# Patient Record
Sex: Male | Born: 1935 | Race: White | Hispanic: No | Smoking: Former smoker
Health system: Southern US, Community
[De-identification: ages and names within clinical notes are randomized; demographics above are authoritative.]

## PROBLEM LIST (undated history)

## (undated) DIAGNOSIS — J189 Pneumonia, unspecified organism: Secondary | ICD-10-CM

## (undated) DIAGNOSIS — I509 Heart failure, unspecified: Secondary | ICD-10-CM

## (undated) DIAGNOSIS — I480 Paroxysmal atrial fibrillation: Secondary | ICD-10-CM

## (undated) DIAGNOSIS — IMO0002 Reserved for concepts with insufficient information to code with codable children: Secondary | ICD-10-CM

## (undated) DIAGNOSIS — I2510 Atherosclerotic heart disease of native coronary artery without angina pectoris: Secondary | ICD-10-CM

## (undated) DIAGNOSIS — IMO0001 Reserved for inherently not codable concepts without codable children: Secondary | ICD-10-CM

## (undated) DIAGNOSIS — J449 Chronic obstructive pulmonary disease, unspecified: Secondary | ICD-10-CM

## (undated) DIAGNOSIS — I219 Acute myocardial infarction, unspecified: Secondary | ICD-10-CM

## (undated) DIAGNOSIS — R918 Other nonspecific abnormal finding of lung field: Secondary | ICD-10-CM

## (undated) DIAGNOSIS — Z950 Presence of cardiac pacemaker: Secondary | ICD-10-CM

## (undated) DIAGNOSIS — I472 Ventricular tachycardia, unspecified: Secondary | ICD-10-CM

## (undated) DIAGNOSIS — N400 Enlarged prostate without lower urinary tract symptoms: Secondary | ICD-10-CM

## (undated) DIAGNOSIS — K219 Gastro-esophageal reflux disease without esophagitis: Secondary | ICD-10-CM

## (undated) DIAGNOSIS — K5790 Diverticulosis of intestine, part unspecified, without perforation or abscess without bleeding: Secondary | ICD-10-CM

## (undated) DIAGNOSIS — I499 Cardiac arrhythmia, unspecified: Secondary | ICD-10-CM

## (undated) DIAGNOSIS — R0602 Shortness of breath: Secondary | ICD-10-CM

## (undated) DIAGNOSIS — K279 Peptic ulcer, site unspecified, unspecified as acute or chronic, without hemorrhage or perforation: Secondary | ICD-10-CM

## (undated) DIAGNOSIS — F419 Anxiety disorder, unspecified: Secondary | ICD-10-CM

## (undated) DIAGNOSIS — M1990 Unspecified osteoarthritis, unspecified site: Secondary | ICD-10-CM

## (undated) DIAGNOSIS — K859 Acute pancreatitis without necrosis or infection, unspecified: Secondary | ICD-10-CM

## (undated) DIAGNOSIS — I255 Ischemic cardiomyopathy: Secondary | ICD-10-CM

## (undated) DIAGNOSIS — Z95810 Presence of automatic (implantable) cardiac defibrillator: Secondary | ICD-10-CM

## (undated) DIAGNOSIS — I10 Essential (primary) hypertension: Secondary | ICD-10-CM

## (undated) HISTORY — DX: Acute myocardial infarction, unspecified: I21.9

## (undated) HISTORY — DX: Ventricular tachycardia: I47.2

## (undated) HISTORY — DX: Peptic ulcer, site unspecified, unspecified as acute or chronic, without hemorrhage or perforation: K27.9

## (undated) HISTORY — DX: Atherosclerotic heart disease of native coronary artery without angina pectoris: I25.10

## (undated) HISTORY — DX: Cardiac arrhythmia, unspecified: I49.9

## (undated) HISTORY — DX: Heart failure, unspecified: I50.9

## (undated) HISTORY — DX: Diverticulosis of intestine, part unspecified, without perforation or abscess without bleeding: K57.90

## (undated) HISTORY — DX: Ventricular tachycardia, unspecified: I47.20

## (undated) HISTORY — DX: Reserved for concepts with insufficient information to code with codable children: IMO0002

## (undated) HISTORY — DX: Reserved for inherently not codable concepts without codable children: IMO0001

## (undated) HISTORY — DX: Shortness of breath: R06.02

## (undated) HISTORY — DX: Gastro-esophageal reflux disease without esophagitis: K21.9

## (undated) HISTORY — PX: OTHER SURGICAL HISTORY: SHX169

## (undated) HISTORY — DX: Ischemic cardiomyopathy: I25.5

## (undated) HISTORY — DX: Essential (primary) hypertension: I10

## (undated) HISTORY — DX: Benign prostatic hyperplasia without lower urinary tract symptoms: N40.0

---

## 1978-12-12 DIAGNOSIS — K859 Acute pancreatitis without necrosis or infection, unspecified: Secondary | ICD-10-CM

## 1978-12-12 DIAGNOSIS — J189 Pneumonia, unspecified organism: Secondary | ICD-10-CM

## 1978-12-12 HISTORY — DX: Acute pancreatitis without necrosis or infection, unspecified: K85.90

## 1978-12-12 HISTORY — DX: Pneumonia, unspecified organism: J18.9

## 1978-12-12 HISTORY — PX: CHOLECYSTECTOMY: SHX55

## 1983-04-13 DIAGNOSIS — I219 Acute myocardial infarction, unspecified: Secondary | ICD-10-CM

## 1983-04-13 HISTORY — DX: Acute myocardial infarction, unspecified: I21.9

## 1994-04-12 HISTORY — PX: CARDIAC DEFIBRILLATOR PLACEMENT: SHX171

## 1999-07-31 ENCOUNTER — Ambulatory Visit (HOSPITAL_COMMUNITY): Admission: RE | Admit: 1999-07-31 | Discharge: 1999-08-01 | Payer: Self-pay | Admitting: Internal Medicine

## 1999-08-01 ENCOUNTER — Encounter: Payer: Self-pay | Admitting: Internal Medicine

## 2004-03-04 ENCOUNTER — Inpatient Hospital Stay (HOSPITAL_COMMUNITY): Admission: AD | Admit: 2004-03-04 | Discharge: 2004-03-05 | Payer: Self-pay | Admitting: Internal Medicine

## 2004-03-04 ENCOUNTER — Ambulatory Visit: Payer: Self-pay | Admitting: Internal Medicine

## 2004-03-19 ENCOUNTER — Ambulatory Visit: Payer: Self-pay | Admitting: Internal Medicine

## 2004-07-27 ENCOUNTER — Ambulatory Visit: Payer: Self-pay

## 2004-12-10 ENCOUNTER — Ambulatory Visit: Payer: Self-pay | Admitting: Family Medicine

## 2005-06-23 ENCOUNTER — Ambulatory Visit: Payer: Self-pay | Admitting: Family Medicine

## 2005-10-26 ENCOUNTER — Ambulatory Visit: Payer: Self-pay | Admitting: Internal Medicine

## 2005-11-01 ENCOUNTER — Ambulatory Visit: Payer: Self-pay | Admitting: Internal Medicine

## 2005-11-01 ENCOUNTER — Observation Stay (HOSPITAL_COMMUNITY): Admission: RE | Admit: 2005-11-01 | Discharge: 2005-11-01 | Payer: Self-pay | Admitting: Internal Medicine

## 2005-11-10 HISTORY — PX: LAPAROTOMY: SHX154

## 2005-11-22 ENCOUNTER — Inpatient Hospital Stay (HOSPITAL_COMMUNITY): Admission: EM | Admit: 2005-11-22 | Discharge: 2005-11-25 | Payer: Self-pay | Admitting: Emergency Medicine

## 2005-11-22 ENCOUNTER — Ambulatory Visit: Payer: Self-pay | Admitting: *Deleted

## 2005-11-22 ENCOUNTER — Encounter: Payer: Self-pay | Admitting: Cardiology

## 2005-11-23 HISTORY — PX: CARDIAC CATHETERIZATION: SHX172

## 2005-12-16 ENCOUNTER — Ambulatory Visit: Payer: Self-pay | Admitting: Internal Medicine

## 2006-10-06 ENCOUNTER — Ambulatory Visit: Payer: Self-pay | Admitting: Internal Medicine

## 2007-01-05 ENCOUNTER — Ambulatory Visit: Payer: Self-pay | Admitting: Internal Medicine

## 2007-04-06 ENCOUNTER — Ambulatory Visit: Payer: Self-pay | Admitting: Internal Medicine

## 2007-07-06 ENCOUNTER — Ambulatory Visit: Payer: Self-pay | Admitting: Internal Medicine

## 2007-09-11 ENCOUNTER — Ambulatory Visit: Payer: Self-pay | Admitting: Internal Medicine

## 2007-12-14 ENCOUNTER — Ambulatory Visit: Payer: Self-pay | Admitting: Internal Medicine

## 2008-03-15 ENCOUNTER — Ambulatory Visit: Payer: Self-pay | Admitting: Internal Medicine

## 2008-06-13 ENCOUNTER — Ambulatory Visit: Payer: Self-pay | Admitting: Internal Medicine

## 2008-06-20 ENCOUNTER — Encounter: Payer: Self-pay | Admitting: Internal Medicine

## 2008-09-24 DIAGNOSIS — I252 Old myocardial infarction: Secondary | ICD-10-CM | POA: Insufficient documentation

## 2008-09-24 DIAGNOSIS — Z87898 Personal history of other specified conditions: Secondary | ICD-10-CM

## 2008-09-24 DIAGNOSIS — I119 Hypertensive heart disease without heart failure: Secondary | ICD-10-CM

## 2008-09-24 DIAGNOSIS — I509 Heart failure, unspecified: Secondary | ICD-10-CM | POA: Insufficient documentation

## 2008-09-24 DIAGNOSIS — Z95810 Presence of automatic (implantable) cardiac defibrillator: Secondary | ICD-10-CM

## 2008-09-24 DIAGNOSIS — K219 Gastro-esophageal reflux disease without esophagitis: Secondary | ICD-10-CM | POA: Insufficient documentation

## 2008-09-24 DIAGNOSIS — I472 Ventricular tachycardia: Secondary | ICD-10-CM | POA: Insufficient documentation

## 2008-09-24 DIAGNOSIS — I2510 Atherosclerotic heart disease of native coronary artery without angina pectoris: Secondary | ICD-10-CM | POA: Insufficient documentation

## 2008-10-07 ENCOUNTER — Ambulatory Visit: Payer: Self-pay | Admitting: Internal Medicine

## 2008-11-06 ENCOUNTER — Encounter: Payer: Self-pay | Admitting: Gastroenterology

## 2008-11-11 ENCOUNTER — Encounter: Payer: Self-pay | Admitting: Gastroenterology

## 2008-12-12 ENCOUNTER — Ambulatory Visit: Payer: Self-pay | Admitting: Gastroenterology

## 2008-12-12 DIAGNOSIS — R1312 Dysphagia, oropharyngeal phase: Secondary | ICD-10-CM

## 2008-12-13 ENCOUNTER — Ambulatory Visit: Payer: Self-pay | Admitting: Gastroenterology

## 2009-01-09 ENCOUNTER — Ambulatory Visit: Payer: Self-pay | Admitting: Internal Medicine

## 2009-02-07 ENCOUNTER — Telehealth: Payer: Self-pay | Admitting: Internal Medicine

## 2009-04-10 ENCOUNTER — Ambulatory Visit: Payer: Self-pay | Admitting: Internal Medicine

## 2009-07-10 ENCOUNTER — Ambulatory Visit: Payer: Self-pay | Admitting: Internal Medicine

## 2009-07-16 ENCOUNTER — Encounter: Payer: Self-pay | Admitting: Internal Medicine

## 2009-09-19 ENCOUNTER — Ambulatory Visit: Payer: Self-pay | Admitting: Internal Medicine

## 2009-12-18 ENCOUNTER — Ambulatory Visit: Payer: Self-pay | Admitting: Internal Medicine

## 2009-12-29 ENCOUNTER — Encounter: Payer: Self-pay | Admitting: Internal Medicine

## 2010-03-17 ENCOUNTER — Ambulatory Visit: Payer: Self-pay | Admitting: Internal Medicine

## 2010-04-12 HISTORY — PX: CATARACT EXTRACTION: SUR2

## 2010-05-14 NOTE — Letter (Signed)
Summary: Remote Device Check  [facility], Main Office  [address]   [place], [place] [address]   Phone: [phone]  Fax: [phone]     July 16, 2009 MRN: [id number]   George Acosta [address] [place], [place]  [address]   Dear [name],   Your remote transmission was recieved and reviewed by your physician.  All diagnostics were within normal limits for you.   ___X___Your next office visit is scheduled for:    JUNE 2011 WITH DR [name]. Please call our office to schedule an appointment.    Sincerely,  Proofreader

## 2010-05-14 NOTE — Cardiovascular Report (Signed)
Summary: Office Visit Remote   Office Visit Remote   Imported By: Roderic Ovens 12/30/2009 12:39:40  _____________________________________________________________________  External Attachment:    Type:   Image     Comment:   External Document

## 2010-05-14 NOTE — Letter (Signed)
Summary: Remote Device Check  [facility], Main Office  [address]   [place], [place] [address]   Phone: [phone]  Fax: [phone]     December 29, 2009 MRN: [id number]   George Acosta [address] [place], [place]  [address]   Dear [name],   Your remote transmission was recieved and reviewed by your physician.  All diagnostics were within normal limits for you.   __X____Your next office visit is scheduled for:   December with Dr [name]. Please call our office to schedule an appointment.    Sincerely,  Vella Kohler

## 2010-05-14 NOTE — Assessment & Plan Note (Signed)
Summary: defib check  Medications Added MULTIVITAMINS   TABS (MULTIPLE VITAMIN) once daily        Visit Type:  Follow-up Primary Provider:  Orson Gear, MD   History of Present Illness: Mr. Buntrock returns today for ICD followup.  He is a 77 yo man with a h/o VT, and an ICM who is s/p ICD implant in 2007.  He denies any intercurrent ICD therapies.  He has class 2 CHF symptoms.  He denies c/p or sob.  He denies syncope or near syncope.  Current Medications (verified): 1)  Sotalol Hcl 120 Mg Tabs (Sotalol Hcl) .... Take One Tablet By Mouth Twice A Day 2)  Potassium Chloride Crys Cr 20 Meq Cr-Tabs (Potassium Chloride Crys Cr) .... Take One Tablet By Mouth Daily 3)  Lorazepam 0.5 Mg Tabs (Lorazepam) .... 1/2 Tablet By Mouth Two Times A Day, Extra 1/2 At Bedtime As Needed 4)  Furosemide 20 Mg Tabs (Furosemide) .... Take One Tablet By Mouth Daily. 5)  Ranitidine Hcl 300 Mg Tabs (Ranitidine Hcl) .... Take 1 Tablet By Mouth Once A Day 6)  Protonix 40 Mg Tbec (Pantoprazole Sodium) .Marland Kitchen.. 1 By Mouth Once Daily 7)  Nitroglycerin 0.4 Mg Subl (Nitroglycerin) .... One Tablet Under Tongue Every 5 Minutes As Needed For Chest Pain---May Repeat Times Three 8)  Aspir-Low 81 Mg Tbec (Aspirin) .Marland Kitchen.. 1 Po Once Daily 9)  Multivitamins   Tabs (Multiple Vitamin) .... Once Daily  Allergies: 1)  ! Sulfa  Past History:  Past Medical History: Last updated: 12/12/2008 CHF (ICD-428.0) CORONARY ARTERY DISEASE (ICD-414.00) VENTRICULAR TACHYCARDIA (ICD-427.1) GERD (ICD-530.81) HYPERTENSION (ICD-401.9) BENIGN PROSTATIC HYPERTROPHY, HX OF (ICD-V13.8) MYOCARDIAL INFARCTION (ICD-410.90) Peptic Ulcer Disease Paroxysmal afib Diverticulosis Recurrent SBO  Past Surgical History: Last updated: 12/12/2008 ICD implanted in 1996  Guidant Vitality II. laparotomy for small bowel obstruction,  2007 Catheterization November 23, 2005  Vital Signs:  Patient profile:   77 year old male Height:      72  inches Weight:      169 pounds BMI:     23.00 Pulse rate:   59 / minute BP sitting:   98 / 64  (left arm)  Vitals Entered By: Laurance Flatten CMA (September 19, 2009 12:25 PM)  Physical Exam  General:  Well developed, well nourished, no acute distress. Head:  Normocephalic and atraumatic. Eyes:  PERRLA, no icterus. Mouth:  No deformity or lesions, dentition normal. Neck:  Supple; no masses or thyromegaly. Chest Wall:  Well healed ICD incision. Lungs:  Clear throughout to auscultation. Heart:  Regular rate and rhythm; no murmurs, rubs,  or bruits. Abdomen:  Soft, nontender and nondistended. No masses, hepatosplenomegaly or hernias noted. Normal bowel sounds. Msk:  Symmetrical with no gross deformities. Normal posture. Pulses:  Normal pulses noted. Extremities:  No clubbing, cyanosis, edema or deformities noted. Neurologic:  Alert and  oriented x4;  grossly normal neurologically.    ICD Specifications Following MD:  Lewayne Bunting, MD     ICD Vendor:  [facility] [employer]     ICD Model Number:  [phone]     ICD Serial Number:  [id number] ICD DOI:  11/01/2005     ICD Implanting MD:  Lewayne Bunting, MD  Lead 1:    Location: RA     DOI: 07/31/1999     Model #: [id number]     Serial #: [id number]     Status: active Lead 2:    Location: RV     DOI: 07/31/1999  Model #: [id number]     Serial #: [id number]     Status: active  Indications::  VT  Explantation Comments: 11-01-05 BSX 1861 EXPLANTED  ICD Follow Up Battery Voltage:  2.59 V     Charge Time:  11.3 seconds     Underlying rhythm:  SR ICD Dependent:  No       ICD Device Measurements Atrium:  Amplitude: 1.5 mV, Impedance: 458 ohms, Threshold: 0.8 V at 0.40 msec Right Ventricle:  Amplitude: 4.4 mV, Impedance: 530 ohms, Threshold: 1.4 V at 0.40 msec  Episodes MS Episodes:  0     Percent Mode Switch:  0     Shock:  0     ATP:  3     Nonsustained:  3     Atrial Therapies:  0 Atrial Pacing:  39%     Ventricular Pacing:  20%  Brady Parameters Mode DDDR     Lower  Rate Limit:  50     Upper Rate Limit 100 PAV 280      Tachy Zones VF:  230     VT:  200     VT1:  160     Next Remote Date:  12/18/2009     Tech Comments:  3 VT-1 EPISODES--ALL SUCCESSFULLY PACE TERMINATED.  NORMAL DEVICE FUNCTION.  NO CHANGES MADE.  LATITUDE CHECK 12-18-09. Vella Kohler  September 19, 2009 12:17 PM MD Comments:  Agree with above.  Impression & Recommendations:  Problem # 1:  AUTOMATIC IMPLANTABLE CARDIAC DEFIBRILLATOR SITU (ICD-V45.02) His device is working normally.  Will recheck in one year.  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  Will continue for now and if he has more VT, would consider amiodarone. His updated medication list for this problem includes:    Sotalol Hcl 120 Mg Tabs (Sotalol hcl) .Marland Kitchen... Take one tablet by mouth twice a day    Nitroglycerin 0.4 Mg Subl (Nitroglycerin) ..... One tablet under tongue every 5 minutes as needed for chest pain---may repeat times three    Aspir-low 81 Mg Tbec (Aspirin) .Marland Kitchen... 1 po once daily  Patient Instructions: 1)  Your physician recommends that you continue on your current medications as directed. Please refer to the Current Medication list given to you today. 2)  Your physician wants you to follow-up in:   6 MONTHS with Dr [name]. You will receive a reminder letter in the mail two months in advance. If you don't receive a letter, please call our office to schedule the follow-up appointment.

## 2010-05-14 NOTE — Cardiovascular Report (Signed)
Summary: Office Visit Remote   Office Visit Remote   Imported By: Roderic Ovens 07/18/2009 15:01:11  _____________________________________________________________________  External Attachment:    Type:   Image     Comment:   External Document

## 2010-05-14 NOTE — Cardiovascular Report (Signed)
Summary: Office Visit   Office Visit   Imported By: Roderic Ovens 09/23/2009 11:04:42  _____________________________________________________________________  External Attachment:    Type:   Image     Comment:   External Document

## 2010-05-14 NOTE — Assessment & Plan Note (Signed)
Summary: f67m/ICD-Guidant/lwb      Allergies Added:   Primary Provider:  Orson Gear, MD   History of Present Illness: [name] returns today for ICD followup.  He is a 77 yo man with a h/o VT, and an ICM who is s/p ICD implant in 2007.  He denies any intercurrent ICD therapies.  He has class 2 CHF symptoms.  He denies c/p or sob.  He denies syncope or near syncope. He does admit to a fairly sedentary lifestyle. No recent hospitalizations for VT or CHF.  Current Medications (verified): 1)  Sotalol Hcl 120 Mg Tabs (Sotalol Hcl) .... Take One Tablet By Mouth Twice A Day 2)  Potassium Chloride Crys Cr 20 Meq Cr-Tabs (Potassium Chloride Crys Cr) .... Take One Tablet By Mouth Daily 3)  Lorazepam 0.5 Mg Tabs (Lorazepam) .... 1/2 Tablet By Mouth Two Times A Day, Extra 1/2 At Bedtime As Needed 4)  Furosemide 20 Mg Tabs (Furosemide) .... Take One Tablet By Mouth Daily. 5)  Ranitidine Hcl 300 Mg Tabs (Ranitidine Hcl) .... Take 1 Tablet By Mouth Once A Day 6)  Protonix 40 Mg Tbec (Pantoprazole Sodium) .Marland Kitchen.. 1 By Mouth Once Daily 7)  Nitroglycerin 0.4 Mg Subl (Nitroglycerin) .... One Tablet Under Tongue Every 5 Minutes As Needed For Chest Pain---May Repeat Times Three 8)  Aspir-Low 81 Mg Tbec (Aspirin) .Marland Kitchen.. 1 Po Once Daily 9)  Multivitamins   Tabs (Multiple Vitamin) .... Once Daily  Allergies (verified): 1)  ! Sulfa  Past History:  Past Medical History: Last updated: 12/12/2008 CHF (ICD-428.0) CORONARY ARTERY DISEASE (ICD-414.00) VENTRICULAR TACHYCARDIA (ICD-427.1) GERD (ICD-530.81) HYPERTENSION (ICD-401.9) BENIGN PROSTATIC HYPERTROPHY, HX OF (ICD-V13.8) MYOCARDIAL INFARCTION (ICD-410.90) Peptic Ulcer Disease Paroxysmal afib Diverticulosis Recurrent SBO  Past Surgical History: Last updated: 12/12/2008 ICD implanted in 1996  Guidant Vitality II. laparotomy for small bowel obstruction,  2007 Catheterization November 23, 2005  Review of Systems  The patient denies chest pain,  syncope, dyspnea on exertion, and peripheral edema.    Vital Signs:  Patient profile:   77 year old male Height:      72 inches Weight:      160 pounds BMI:     21.78 Pulse rate:   60 / minute Resp:     16 per minute BP sitting:   101 / 58  (right arm)  Vitals Entered By: Marrion Coy, CNA (March 17, 2010 12:34 PM)  Physical Exam  General:  Well developed, well nourished, no acute distress. Head:  Normocephalic and atraumatic. Eyes:  PERRLA, no icterus. Mouth:  No deformity or lesions, dentition normal. Neck:  Supple; no masses or thyromegaly. Chest Wall:  Well healed ICD incision. Lungs:  Clear throughout to auscultation. Heart:  Regular rate and rhythm; no murmurs, rubs,  or bruits. Abdomen:  Soft, nontender and nondistended. No masses, hepatosplenomegaly or hernias noted. Normal bowel sounds. Msk:  Symmetrical with no gross deformities. Normal posture. Pulses:  Normal pulses noted. Extremities:  No clubbing, cyanosis, edema or deformities noted. Neurologic:  Alert and  oriented x4;  grossly normal neurologically.    ICD Specifications Following MD:  Lewayne Bunting, MD     ICD Vendor:  [employer] [employer]     ICD Model Number:  [phone]     ICD Serial Number:  [id number] ICD DOI:  11/01/2005     ICD Implanting MD:  Lewayne Bunting, MD  Lead 1:    Location: RA     DOI: 07/31/1999     Model #: [id number]  Serial #: [id number]     Status: active Lead 2:    Location: RV     DOI: 07/31/1999     Model #: [id number]     Serial #: [id number]     Status: active  Indications::  VT  Explantation Comments: 11-01-05 BSX 1861 EXPLANTED  ICD Follow Up Remote Check?  No Battery Voltage:  2.58 V     Charge Time:  11.5 seconds     Battery Est. Longevity:  1+ year Underlying rhythm:  NSR @ 60 ICD Dependent:  No       ICD Device Measurements Atrium:  Amplitude: 1.0 mV, Impedance: 448 ohms, Threshold: .6 V at .4 msec Right Ventricle:  Amplitude: 3.9 mV, Impedance: 524 ohms, Threshold: 1.4 V at .4 msec Shock  Impedance: 45 ohms   Episodes MS Episodes:  0     Percent Mode Switch:  0     Shock:  0     ATP:  0     Nonsustained:  0     Atrial Therapies:  0 Atrial Pacing:  47%     Ventricular Pacing:  31%  Brady Parameters Mode DDDR     Lower Rate Limit:  50     Upper Rate Limit 100 PAV 280      Tachy Zones VF:  230     VT:  200     VT1:  160     Next Remote Date:  06/18/2010     Next Cardiology Appt Due:  03/15/2011 Tech Comments:  NORMAL DEVICE FUNCTION.  NO CHANGES MADE. LATITUDE 06-18-10 AND ROV IN 12 MTHS W/GT. CHECKED BY INDUSTRY. Vella Kohler  March 17, 2010 12:47 PM MD Comments:  Agree with above.  Impression & Recommendations:  Problem # 1:  VENTRICULAR TACHYCARDIA (ICD-427.1) HIs symptoms are well controlled on the meds as below. His updated medication list for this problem includes:    Sotalol Hcl 120 Mg Tabs (Sotalol hcl) .Marland Kitchen... Take one tablet by mouth twice a day    Nitroglycerin 0.4 Mg Subl (Nitroglycerin) ..... One tablet under tongue every 5 minutes as needed for chest pain---may repeat times three    Aspir-low 81 Mg Tbec (Aspirin) .Marland Kitchen... 1 po once daily  Problem # 2:  AUTOMATIC IMPLANTABLE CARDIAC DEFIBRILLATOR SITU (ICD-V45.02) HIs device is working normally.  Will recheck in several months.  Problem # 3:  HYPERTENSION (ICD-401.9) His blood pressure is well controlled and he will maintain a low sodium diet. His updated medication list for this problem includes:    Sotalol Hcl 120 Mg Tabs (Sotalol hcl) .Marland Kitchen... Take one tablet by mouth twice a day    Furosemide 20 Mg Tabs (Furosemide) .Marland Kitchen... Take one tablet by mouth daily.    Aspir-low 81 Mg Tbec (Aspirin) .Marland Kitchen... 1 po once daily  Patient Instructions: 1)  Your physician wants you to follow-up in:  12 months with Dr Court Joy will receive a reminder letter in the mail two months in advance. If you don't receive a letter, please call our office to schedule the follow-up appointment.

## 2010-05-14 NOTE — Cardiovascular Report (Signed)
Summary: Office Visit Remote   Office Visit   Imported By: Roderic Ovens 05/07/2009 12:19:17  _____________________________________________________________________  External Attachment:    Type:   Image     Comment:   External Document

## 2010-06-18 ENCOUNTER — Encounter: Payer: Self-pay | Admitting: Internal Medicine

## 2010-06-18 ENCOUNTER — Encounter (INDEPENDENT_AMBULATORY_CARE_PROVIDER_SITE_OTHER): Payer: Self-pay

## 2010-06-18 DIAGNOSIS — I428 Other cardiomyopathies: Secondary | ICD-10-CM

## 2010-06-25 ENCOUNTER — Encounter: Payer: Self-pay | Admitting: *Deleted

## 2010-06-30 NOTE — Letter (Signed)
Summary: Remote Device Check  [facility], Main Office  [address]   [place], [place] [address]   Phone: [phone]  Fax: [phone]     June 25, 2010 MRN: [id number]   George Acosta [address] [place], [place]  [address]   Dear [name],   Your remote transmission was recieved and reviewed by your physician.  All diagnostics were within normal limits for you.  __X___Your next transmission is scheduled for: 09/17/10.    Please transmit at any time this day.  If you have a wireless device your transmission will be sent automatically.  ______Your next office visit is scheduled for:                              . Please call our office to schedule an appointment.    Sincerely,  Altha Harm, LPN

## 2010-06-30 NOTE — Cardiovascular Report (Signed)
Summary: Office Visit   Office Visit   Imported By: Roderic Ovens 06/25/2010 16:26:15  _____________________________________________________________________  External Attachment:    Type:   Image     Comment:   External Document

## 2010-07-15 ENCOUNTER — Other Ambulatory Visit: Payer: Self-pay | Admitting: *Deleted

## 2010-07-15 MED ORDER — SOTALOL HCL 120 MG PO TABS: 120.0000 mg | ORAL_TABLET | Freq: Two times a day (BID) | ORAL | Status: DC

## 2010-07-15 NOTE — Telephone Encounter (Signed)
Addended by: Hardin Negus on: 07/15/2010 01:21 PM   Modules accepted: Orders

## 2010-07-16 ENCOUNTER — Other Ambulatory Visit: Payer: Self-pay | Admitting: *Deleted

## 2010-07-16 MED ORDER — SOTALOL HCL 120 MG PO TABS: 120.0000 mg | ORAL_TABLET | Freq: Two times a day (BID) | ORAL | Status: DC

## 2010-07-17 ENCOUNTER — Other Ambulatory Visit: Payer: Self-pay | Admitting: *Deleted

## 2010-07-17 MED ORDER — SOTALOL HCL 120 MG PO TABS: 120.0000 mg | ORAL_TABLET | Freq: Two times a day (BID) | ORAL | Status: DC

## 2010-08-25 NOTE — Assessment & Plan Note (Signed)
[facility]                         ELECTROPHYSIOLOGY OFFICE NOTE   NAME:Maceachern, YUNIS VOORHEIS                       MRN:          [id number]  DATE:10/06/2006                            DOB:          June 03, 1928    Mr. Cassity returns today for followup.  He is a very pleasant elderly  male with a history of symptomatic ventricular tachycardia and a  cardiomyopathy, status post ICD insertion.  He returns today for  followup.  He denies chest pain or shortness of breath, has otherwise  been stable.  He has gained over 20 pounds in the last several months,  secondary to increase and improvement of his appetite.   PHYSICAL EXAMINATION:  GENERAL:  He is a pleasant, well-appearing man in  no distress.  VITAL SIGNS:  Blood pressure 123/55, pulse 66 and regular, respirations  18.  Weight was not recorded.  NECK:  No jugular venous distention.  LUNGS:  Clear bilaterally to auscultation.  There are no wheezes, rales  or rhonchi present.  CARDIOVASCULAR:  Regular rate and rhythm with a normal S1 and S2.  EXTREMITIES:  No edema.   Interrogation of his defibrillator demonstrates no intercurrent ICD  therapies.  The P and R waves are 2 and 4 respectively.  The impedance  is 453 in the atrium and 512 in the ventricle with a threshold of 0.8 at  0.4 in the atrium and 1.8 at 0.4 in the ventricle.  Battery voltage is  3.2 volts.  Today we turned his outputs to 3.5 at 0.4 in the ventricle.  He was 35% A-paced.   IMPRESSION:  1. Ischemic cardiomyopathy.  2. Ventricular tachycardia.  3. Status post implantable cardioverter/defibrillator (ICD) insertion.   DISCUSSION:  Overall, Mr. Fritcher is stable, and his defibrillator is  working normally.  I have asked that he not try to gain anymore weight.  We will see him back in the office in one year.  He will follow up in  our Lat[facility] program.     Doylene Canning. [name], MD  Electronically Signed    GWT/MedQ  DD: 10/06/2006   DT: 10/06/2006  Job #: [id number]   cc:   Elease Hashimoto A. Benedetto Goad, M.D.

## 2010-08-25 NOTE — Assessment & Plan Note (Signed)
[facility]                         ELECTROPHYSIOLOGY OFFICE NOTE   NAME:[name], Acosta                       MRN:          [id number]  DATE:09/11/2007                            DOB:          1929/01/02    [name] returns today for followup.  He is a very pleasant elderly  male with ischemic cardiomyopathy, ventricular tachycardia with status  post ICD insertion.  He returns today for followup.  He denies chest  pain or shortness of breath.  He is somewhat depressed over the recent  loss of his wife of 54 years.   MEDICATIONS:  1. Lorazepam p.r.n.  2. Omeprazole 20 a day.  3. Multivitamin.  4. Aspirin 81 a day.  5. Furosemide 20 a day.  6. Sotalol 120 a day.  7. Nitroglycerin p.r.n.   PHYSICAL EXAMINATION:  He a pleasant elderly-appearing man, no distress.  Blood pressure was 103/61.  The pulse is 60 and regular.  Respirations  were 18.  The weight was 159 pounds.  NECK:  No jugular venous distention.  LUNGS:  Clear bilateral.  CHEST:  No wheezes, rales or rhonchi are present.  CARDIOVASCULAR:  Regular rate and rhythm, normal S1 and S2.  ABDOMEN:  Soft, nontender.  EXTREMITIES:  Demonstrated no peripheral edema.   Interrogation of his defibrillator demonstrates a Guidant Vitality T-  165.  The P and R waves were 1 in 5 respectively.  The impendence 463 in  the A, 524 in the V.  The threshold is 0.8 to 0.4 in the A, 1.6 to 0.6  in the V, the__________ volts.  There were __________ therapies.  The  patient was 30% A paced, 11% V paced.   IMPRESSION:  1. Ischemic cardiomyopathy  2. Ventricular tachycardia on sotalol.  3. Status post ICD insertion.  Discussion __________ George Acosta is      stable and his defibrillator is working normally.  We will plan to      see the patient back in the office in 1 year.  He will followup      with our latitude program as well.     Doylene Canning. [name], MD  Electronically Signed   GWT/MedQ  DD:  09/11/2007  DT: 09/11/2007  Job #: [id number]

## 2010-08-28 NOTE — Discharge Summary (Signed)
George Acosta, George Acosta NO.:  [id number]   MEDICAL RECORD NO.:  [id number]          PATIENT TYPE:  INP   LOCATION:  2899                         FACILITY:  [facility]   PHYSICIAN:  Maple Mirza, P.A. DATE OF BIRTH:  [birth year]   DATE OF ADMISSION:  11/01/2005  DATE OF DISCHARGE:  11/01/2005                                 DISCHARGE SUMMARY   ALLERGIES:  SULFA.   PRINCIPAL DIAGNOSES:  1.  Cardioverted defibrillator and elective replacement indicator.      1.  Ischemic cardiomyopathy, history of myocardial infarction 1985.      2.  ICD implanted 1996 from monomorphic ventricular tachycardia.  2.  Hypokalemia despite 10 mEq daily.  Last laboratory study was 3.4.  The      patient was given 10 mEq.  Laboratory study this admission on 10 mEq was      3.4.  Potassium will be increased to 20 mEq daily.   SECONDARY DIAGNOSES:  1.  Hypertension.  2.  Status post cholecystectomy.  3.  Benign prostatic hypertrophy/TURP.  4.  GERD.   PROCEDURE:  Explant of existing cardioverter defibrillator.  Implant of  guidance Vitality 2 ICD Dr. Lewayne Bunting.   BRIEF HISTORY:  Mr. Drayke Loh is a 77 year old male with a history of  myocardial infarction and before that with a history of myocardial  infarction in 1985.  He has ischemic cardiomyopathy and was found to have  sustained monomorphic ventricular tachycardia.  A cardioverter defibrillator  was implanted in 1996.  The implant was changed last in 2001.  He returned  to the office October 26, 2005.  The device was found to be in elective  replacement indicator.   Interrogation of the device demonstrated that there was no intercurrent ICD  discharges but 15 anti tachycardia-pacing episodes.  The patient says he is  not aware of tachy palpitations.   The patient's activity level is good.  He has classic I-II congestive heart  failure symptoms.  He has no complaint of chest pain or syncope.  He sleeps  on two pills but that is  because he has ongoing GERD.   HOSPITAL COURSE:  The patient presented electively November 01, 2005 for change  out of his cardioverter defibrillator.  This was done without incident.  The  patient was discharged the same day.  The incision was without swelling or  erythema.  He [facility] up at [facility] on Monday, November 22, 2005 at  9:20.  He will go home on Keflex 250 mg one tablet 30 minutes before each  meal and at bedtime for a five-day period.  He will also go home on a new  dose of potassium 20 mEq daily.  His other medications include furosemide 20  mg daily, metoprolol 50 mg tablets 1/2 tablet in the morning, 1/2 tablet in  the evening, Digoxin 0.125 mg daily, Imdur 30 mg daily, omeprazole 20 mg  daily, multivitamin daily, enteric-coated aspirin 81 mg daily, lorazepam 0.5  mg 1/2 tab twice  daily.  He was asked to lift nothing heavier than 10  pounds for the next  four weeks.  Not to drive the next two days.  He is to keep his incision dry  for the next seven days and sponge bathe until Monday, July 30.  This was a  20-minute discharge.      Maple Mirza, P.A.     GM/MEDQ  D:  11/01/2005  T:  11/01/2005  Job:  [id number]

## 2010-08-28 NOTE — Discharge Summary (Signed)
NAMEOREE, George NO.:  [id number]   MEDICAL RECORD NO.:  [id number]          PATIENT TYPE:  INP   LOCATION:  2025                         FACILITY:  [facility]   PHYSICIAN:  Doylene Canning. [name], M.D.  DATE OF BIRTH:  Jan 01, 1929   DATE OF ADMISSION:  03/04/2004  DATE OF DISCHARGE:  03/05/2004                           DISCHARGE SUMMARY - REFERRING   PROCEDURES:  Renal/scrotal ultrasound November 24th.   REASON FOR ADMISSION:  Mr. Leitz is a 77 year old male with known history  of ischemic cardiomyopathy status post ICD implantation secondary to a  history of ventricular tachycardia, followed by Dr. Lewayne Bunting, who  initially presented to [facility] with genitourinary pain,  hypertension, and urinary tract infection.  He subsequently developed  ventricular tachycardia resulting in ICD discharge (x3), and was thus  referred for further evaluation and management.  Please refer to dictated  admission note for full details.   LABORATORY DATA:  WBC 4.7, hemoglobin 11.4, hematocrit 32 (MCV 88),  platelets 155.  TSH 0.83.   Admission chest x-ray:  Mild, chronic interstitial lung disease.   Renal/scrotal ultrasound:  Decreased blood flow to right epididymis  suggestive of right epididymitis; complex-appearing left epididymal cyst;  negative ultrasound of the kidneys; no hydronephrosis.   HOSPITAL COURSE:  Following transfer from [facility], the patient was  maintained on an antibiotic regimen with ciprofloxacin 400 mg IV q.12h. for  treatment of probable right epididymitis.  He was noted to have been on  metoprolol at home, which had subsequently been discontinued secondary to  the noted hypotension, but following transfer was placed on Toprol 25 mg  daily for his ischemic cardiomyopathy and suppression of recurrent  ventricular tachycardia.  The patient was able to tolerate this medication  during his brief stay.  ACE inhibitor and Lasix, however, were  placed on  hold.  The patient was placed on Lovenox in the interim.   The patient was referred to Dr. Aldean Ast for urologic evaluation.  An  ultrasound of the kidney and the scrotum was done consistent with left  epididymitis and no renal pathology.  Of note, the patient will need a  follow up post-void residual, with Dr. Aldean Ast, at time of office follow  up.   Dr. Aldean Ast recommended continued medical therapy with a 2-week course of  oral ciprofloxacin.  Additionally, he added Flomax to the regimen.   Of note, Dr. [name] recommended early follow up ICD interrogation, which  will be arranged at our office.   DISCHARGE MEDICATIONS:  1.  Ciprofloxacin 500 mg b.i.d. (x2 weeks).  2.  Flomax 0.4 mg daily.  3.  Coated aspirin 325 mg daily.  4.  Toprol-XL 25 mg daily.  5.  Digoxin 0.125 mg daily.  6.  Prilosec 20 mg daily.  7.  Lasix 20 mg daily.  8.  Isosorbide 30 mg daily.  9.  Ativan as directed.   INSTRUCTIONS:  1.  Hold lisinopril until further orders.  Stop taking metoprolol.  2.  Follow up at Dr. Olga Millers office on [place], November 25th, for ICD  interrogation.  Arrangements will be made through our office.  3.  Follow up with Dr. Vic Blackbird in 3 weeks.  The patient is      instructed to call the office [phone]) for appointment.   DISCHARGE DIAGNOSES:  1.  Status post implantable cardioverter defibrillator discharge.      1.  Secondary to a recurrent ventricular tachycardia (off beta-blocker).  2.  Urinary tract infection/right epididymitis.  3.  Ischemic cardiomyopathy.  4.  History of ventricular tachycardia.      1.  Status post implantable cardioverter defibrillator implantation.  5.  Hypotension.       GS/MEDQ  D:  03/05/2004  T:  03/05/2004  Job:  [id number]   cc:   Heide Guile, MD  [address]  [place]  [place] [address]  Fax: [phone]   Gilford Rile. Benedetto Goad, M.D.  [address]  [place]  [place] [address]  Fax: [phone]   Rozanna Boer., M.D.  [address]  [place]  [place] [address]  Fax: [phone]

## 2010-08-28 NOTE — Assessment & Plan Note (Signed)
[facility]                           ELECTROPHYSIOLOGY OFFICE NOTE   NAME:George Acosta, George Acosta                       MRN:          [id number]  DATE:12/16/2005                            DOB:          06-Apr-1929    Mr. Concannon returns today for follow up.  He is a very pleasant, elderly male  with a history of ischemic cardiomyopathy and VT, who is status post ICD  insertion.  He was recently hospitalized with VT storm.  He returns today  for follow-up.  At the time of his hospitalization, his beta-blocker was  changed to Sotalol and his digoxin was discontinued.  He denies chest pain  or shortness of breath and, overall, has been stable.   EXAM:  GENERAL:  He is a pleasant, elderly man in no distress.  VITAL SIGNS:  Blood pressure 100/66 with a pulse 15 and regular,  respirations were 18.  Weight was 156 pounds.  NECK:  Revealed no jugular venous distention.  LUNGS:  Clear bilaterally to auscultation.  CARDIOVASCULAR:  Revealed a regular bradycardia with normal S1, S2.  EXTREMITIES:  Demonstrated no edema.   Interrogation of his defibrillator demonstrates a Guidant  Vitality T1-65  with P and R waves of 2-1/2 and 4-1/2, respectively.  The patient's  impedence was 478 in the atrium and 518 in the ventricle.  Threshold 0.6 and  0.5 in the atrium, 1.4 and 0.5 in the ventricle.  Battery voltage is 3.2  volts.  There were multiple ICD therapies from his previous hospitalization  back in August noted.  Today, his outputs were turned down to 2 at 0.4 in  the atrium and 2.6 at 0.4 on the right ventricle and the AP delay was  increased to 280 milliseconds from 240 milliseconds to minimize any  ventricular pacing.   IMPRESSION:  1. Ischemic cardiomyopathy.  2. Ventricular tachycardia.  3. Status post implantable cardioverter device insertion.  4. Chronic Sotalol therapy.   DISCUSSION:  Overall, Mr. Cichy is stable and his defibrillator is working  normally.  We will plan to see him back in the office in 4 months.                                   Doylene Canning. [name], MD   GWT/MedQ  DD:  12/16/2005  DT:  12/16/2005  Job #:  [id number]   cc:   Gilford Rile. Benedetto Goad, M.D.  Harl Bowie, M.D.

## 2010-08-28 NOTE — Discharge Summary (Signed)
. [facility]  Patient:    George Acosta, George Acosta                         MRN: [id number] Adm. Date:  07/31/99 Disc. Date: 08/01/99 Attending:  Nathen May, M.D. Dictator:   Rozell Searing, P.A.                           Discharge Summary  PROCEDURES:  ICD generator change/upgrade.  REASON FOR ADMISSION:  [name] is a 77 year old male, status post ICD secondary to history of GT, who was recently seen in the office by Dr. Graciela Husbands for APS follow-up.  ICD was noted to have reached EOL.  Thus, patient was admitted for elective ICD generator replacement.  HOSPITAL COURSE:  Patient underwent successful removal of previous single chamber ICD, by Dr. Rosette Reveal, on April 20.  Dr. [name] also inserted a new atrial lead as well as upgraded the ICD to a dual chamber system.  No complications were noted.  Patient was kept for overnight observation, and cleared for discharge the following morning following results of chest x-ray. Wound site remained stable without evidence of hematoma.  FINAL RECOMMENDATIONS:  Outpatient Keflex x 7 days.  Follow up with Dr. Rosette Reveal 1-2 weeks for wound check.  Resume previous home medication regimen.  DISCHARGE MEDICATIONS: 1. Keflex 250 mg q.6h. (x 7 days) 2. Accupril 30 mg q.d. 3. Metoprolol 25 mg b.i.d. 4. Imdur 30 mg q.d. 5. Lasix 40 mg q.d. 6. Lanoxin 0.125 mg q.d. 7. K-Dur 20 mEq q.d. 8. Coated aspirin.  INSTRUCTIONS:  WOUND CARE:  Patient is to keep his wound site dry and avoid bathing.  ACTIVITY:  He is to gradually maneuver his shoulder as outlined.  FOLLOW-UP:  He is to call our office if there is any swelling/bleeding of the wound site.  Patient is instructed to schedule follow-up appointment with Dr. Rosette Reveal in the following 1-2 weeks for wound check.  DISCHARGE DIAGNOSES: 1. ICD end of life.    a. Status post generator change and upgrade.    b. History of ventricular tachycardia. 2. Severe  cardiomyopathy.    a. Ischemic/nonischemic.    b. Ejection fraction 20-25%.  LABORATORY DATA (PREADMISSION):  Normal metabolic profile and CBC.  INR 1.0. DD:  08/01/99 TD:  08/01/99 Job: [id number]

## 2010-08-28 NOTE — H&P (Signed)
NAMEQUINDARIUS, CABELLO NO.:  [id number]   MEDICAL RECORD NO.:  [id number]          PATIENT TYPE:  INP   LOCATION:  2908                         FACILITY:  [facility]   PHYSICIAN:  Garrison Columbus. Haithcock, MDDATE OF BIRTH:  07-20-1928   DATE OF ADMISSION:  11/21/2005  DATE OF DISCHARGE:                              HISTORY & PHYSICAL   COMMENT:  Dictated on behalf of Dr. Dorethea Clan and Dr. [name].   CHIEF COMPLAINT:  ICD firing.   HISTORY OF PRESENT ILLNESS:  George Acosta is a 77 year old male with  ischemic cardiomyopathy and a history of monomorphic VT, status post an  ICD generator change on November 01, 2005.  Approximately a week and a half  after the generator change, he experienced GI distress, was n.p.o. for  several days, unable to take his p.o. medications including his beta  blocker and was hospitalized in [facility] with diagnosis of  small-bowel obstruction.  While hospitalized, he sustained an ICD shock.  Upon discharge today at home he sustained multiple ICD shocks.  Prior to  the shocks, the patient had no symptoms of shortness of breath,  lightheadedness or dizziness.  He reports his ICD was turned off, then  back on for surgery, but does not think any programming changes were  made.  Workup in the emergency room was significant for potassium of  3.5.  The patient's device was interrogated and the emergency room  results were listed below.   ALLERGIES:  SULFA.   MEDICATIONS:  1. Nitroglycerin.  2. Metoprolol 25 mg p.o. b.i.d.  3. Digoxin 0.125 mg daily.  4. Furosemide 20 mg daily.  5. Imdur 30 mg daily.  6. Omeprazole 20 mg daily.  7. Lorazepam 0.5 mg p.r.n.  8. Aspirin 81 mg daily.   PAST MEDICAL HISTORY:  1. MI in 1985.  2. Sustained monomorphic VT and ischemic cardiomyopathy, ICD placed in      1996 with a generator change in 2001.   SOCIAL HISTORY:  He lives in [place] with his family.   FAMILY HISTORY:  Coronary artery disease.   REVIEW OF SYSTEMS:  Otherwise negative.   PHYSICAL EXAMINATION:  VITAL SIGNS:  Temperature is 87, pulse is 84,  respiratory rate is 18 and blood pressure is 127/30.  GENERAL:  He is a pleasant man in no apparent distress.  HEENT:  Exam shows normocephalic, atraumatic.  Pupils are equally round  and reactive to light.  Extraocular motions intact.  NECK:  Supple.  LYMPHADENOPATHY:  None.  CARDIOVASCULAR:  Jugular venous pulsations normal.  Regular S1 and S2.  No murmurs, rubs, or gallops.  Pulses 2+ bilaterally.  His device site  is intact with the incision closed by Steri-Strips.  LUNGS:  Clear to auscultation.  SKIN:  No rashes.  ABDOMEN:  Stigmata of recent laparoscopic surgery.  The sites are  dressed and appear intact.  NEUROLOGIC:  Alert and oriented x3.  Cranial nerves II-XII grossly  intact, 5/5 strength in all extremities, normal sensation throughout.   ASSESSMENT AND PLAN:  Implantable cardioverter-defibrillator firing.  The patient, in the emergency room,  had several episodes of monomorphic  ventricular tachycardia on telemetry.  I interrogated his device.  Currently, he is in normal sinus rhythm.  His device is set up as a 3-  zone device with ventricular tachycardia detected at 140, monitor only,  time of fast ventricular tachycardia detected at 165 with  antitachycardia pacing x2 followed by 21 then 31 and 31 through  cardioversions.  Ventricular fibrillation is treated at 210 beats per  minute, treated with 23 followed by 31 and 31 through defibrillations.  On November 21, 2005 at 2234, the patient  was shocked x2 appropriately  for a ventricular rhythm in the ventricular fibrillation zone, then at  2235, the patient had antitachycardia pacing followed by shock x3, all  appropriate, for ventricular tachycardia.  On November 20, 2005, the  patient had 1 shock for ventricular fibrillation and on November 18, 2005,  the patient also had 1 shock for ventricular fibrillation.  I  suspect  that the patient had been off his medications during his workup of small-  bowel obstruction and this combined with electrolyte shifts could  certainly exacerbate his arrhythmia.  However, the patient has had  multiple episodes of ventricular tachycardia and even on prior visits,  has had several episodes treated with antitachycardia pacing, not  requiring shocks, which predated the gastrointestinal illness.  Consideration therefore should be given for antiarrhythmic therapy.  For  now, we will initiate lidocaine therapy and restart the patient's beta  blocker at an increased dose of 50 mg p.o. q.6 h.  We will aggressively  supplement electrolytes as the patient was found to have potassium of  3.5 in the emergency room.  However, we will also check thyroid function  tests, liver function tests and pulmonary function tests with the  expectation that the patient may require additional antiarrhythmic  therapy.  Consideration could be given to amiodarone in his situation.      Daniel B. Haithcock, MD  Electronically Signed     DBH/MEDQ  D:  11/22/2005  T:  11/22/2005  Job:  [id number]

## 2010-08-28 NOTE — Procedures (Signed)
[place]  Patient:    George Acosta, George Acosta                       MRN: [id number] Proc. Date: 07/31/99 Adm. Date:  [id number] Attending:  Nathen May CC:         Harl Bowie, M.D.             Kathrine Cords and Delsa Grana             at [facility]                           Procedure Report  PROCEDURE:  Removal of an old single chamber ICD and insertion of a new dual-chamber ICD with implantation of a new atrial pacing lead.  INDICATION:  This patient is a very pleasant 77 year old man with ischemic and nonischemic cardiomyopathy and ventricular tachycardia which is hemodynamically  significant.  He underwent ICD implantation five years ago.  Since implantation, he has done fairly well but has complained of some intermittent weakness associated with heart rates in the mid-40s to mid-50s.  He has a CPI model [id number] defibrillator with stable R-waves in the 4 to 4.5 range with stable pacing impedances.  He is  subsequently referred for ICD implantation and upgrade to a dual-chamber system.  DESCRIPTION OF PROCEDURE:  After informed consent was obtained, the patient was  taken to the diagnostic EP lab in the fasting state.  After the usual preparation and draping, intravenous fentanyl and midazolam were given for conscious sedation. A total of 30 cc of lidocaine was infiltrated into the left infraclavicular region over the old surgical scar.  An 11 cm wound was carried out over the old surgical scar and electrocautery and blunt dissection were utilized to dissect down through the pectoralis muscle and subcutaneous tissue and to the ICD.  Electrocautery was utilized to maintain hemostasis.  The old ICD was subsequently removed and the leads were free of adhesions with blunt dissection and electrocautery.  At this point, 20 cc of contrast was injected into the left upper extremity venous system and this demonstrated a patent left  subclavian vein.  A Pacesetter model  1488, active fixation pacing lead was inserted into the subclavian vein and advanced into the right atrium.  The lead was placed in the right atrial appendage and actively fixed.  The final measured P-waves were 3 mV and the atrial pacing threshold was 0.2 V t 0.5 msec with a pacing impedance of 494 ohms.  With the atrial lead in satisfactory position, it was secured to the pectoralis muscle with a figure-of-eight suture. This sewing sleeve was also secured with a silk suture.  Following this, kanamycin irrigation was utilized to irrigate the wound.  The pacing and defibrillation leads were then connected to the CPI, model 1861 dual-chamber ICD, and the ICD was also secured to the subpectoralis fascia with  silk suture.  Following this, additional kanamycin irrigation was utilized to irrigate the wound and the wound was inspected and this demonstrated no evidence of active bleeding. The wound was then closed with two layers of 2-0 Vicryl, followed by a layer of 3-0 Vicryl, and a layer of 4-0 Vicryl.  At this point and time, defibrillation threshold testing was carried out.  Dr. Wynona Luna group was asked to come in and provide deeper sedation and help in maintaining the patients airway.  After the patient  was asleep, ventricular fibrillation was induced with a T-wave shock.  A 21 joule shock was subsequently utilized to restore sinus rhythm.  At this point, the patient sedation was reversed with romazicon and benzoin was  painted on the skin and Steri-Strips were applied and the patient returned to his room in good condition.  COMPLICATIONS:  None.  RESULTS:  This demonstrates successful explantation of an old guidant model 1762 single chamber defibrillator which was at [facility] and implantation of a new dual chamber guidant defibrillator system without immediate suture complication. DD:  07/31/99 TD:  07/31/99 Job:  [id number]

## 2010-08-28 NOTE — Op Note (Signed)
NAMEHUSEIN, George Acosta NO.:  [id number]   MEDICAL RECORD NO.:  [id number]          PATIENT TYPE:  INP   LOCATION:  2899                         FACILITY:  [facility]   PHYSICIAN:  Doylene Canning. [name], M.D.  DATE OF BIRTH:  Jan 29, 1929   DATE OF PROCEDURE:  11/01/2005  DATE OF DISCHARGE:                                 OPERATIVE REPORT   ELECTROPHYSIOLOGIC PROCEDURE NOTE   PROCEDURE PERFORMED:  Removal of previously implanted dual-chamber  implantable cardioverter-defibrillator in the subpectoralis major pocket and  insertion of a new dual-chamber implantable cardioverter-defibrillator by  way of the same subpectoralis implant.   1.  INTRODUCTION:  The patient is 77 year old male with a history of an ischemic cardiomyopathy  and VT, status post initial ICD implantation in 1996 and generator change in  2001, who presented with device at end-of-life.  He also has severe sinus  bradycardia with high-grade conduction disease.   1.  PROCEDURE:  After informed consent was obtained,  the patient was taken to the  diagnostic catheterization lab in the fasting state.  After usual  preparation and draping, intravenous fentanyl and Midazolam was given for  sedation.  30 mL of lidocaine was infiltrated over the left infraclavicular  insertion site and a 10-cm incision was carried out over this region.  Electrocautery utilized to dissect down to the fascial plane.  The device  was under the pectoralis major and with a combination of electrocautery and  blunt dissection, the device was dissected free from underneath the  subpectoralis major muscle and removed with gentle traction.  Electrocautery  utilized to assure hemostasis.  The pocket was irrigated.  The leads were  removed from the generator.  The liter analyzed.  The P-waves measured 1.9  mV.  The R-waves 6.9 mV.  The pace impedance 453.  The atrium 596 in the  ventricle.  Threshold 0.4, 0.5 in the atrium  and 1.7 to 0.5 in  the  ventricle.  These satisfactory parameters.  The new Guidant Vitality II, ICD  model T165 serial number [id number] was connected to the atrial and ventricular  leads and placed back in the subcutaneous pocket.  Additional kanamycin was  utilized to irrigate the pocket and defibrillation threshold testing carried  out.   After the patient was more deeply sedated with fentanyl and Versed, a T-wave  shock was delivered which induced ventricular tachycardia at a rate of 140  beats per minute (below the detection rate).  At this point pacing was  subsequently carried out through the device, which  paced the patient from a  slower VT into a faster VT at 185 beats per minute.  This resulted in  adequate detection of the arrhythmia and the device charged up and shocked  at 14 joules, terminating VT and restoring sinus rhythm.  The shock  impedance was 39 ohms.  With this satisfactory parameter, the pocket was  closed with layer of 2-0 Vicryl followed by layer of 3-0 Vicryl, followed by  layer of 4-0 Vicryl.  Benzoin was painted on the skin.  Steri-Strips were  applied and pressure  dressing was placed.  The patient was returned to his  room in satisfactory condition.   1.  COMPLICATIONS:  There were no immediate procedure complications.   1.  RESULTS:  This demonstrates successful removal of previously implanted subpectoralis  Guidant implantable cardioverter-  defibrillator  insertion and insertion of a new Guidant implantable cardioverter-  defibrillator in a patient with symptomatic bradycardia and ventricular  tachycardia and an ischemic cardiomyopathy.           ______________________________  Doylene Canning. [name], M.D.     GWT/MEDQ  D:  11/01/2005  T:  11/01/2005  Job:  [id number]   cc:   Elease Hashimoto A. Benedetto Goad, M.D.  Fax: [phone]

## 2010-08-28 NOTE — H&P (Signed)
NAMECORIAN, HANDLEY NO.:  [id number]   MEDICAL RECORD NO.:  [id number]          PATIENT TYPE:  INP   LOCATION:  2899                         FACILITY:  [facility]   PHYSICIAN:  Doylene Canning. [name], M.D.  DATE OF BIRTH:  11-25-1928   DATE OF ADMISSION:  11/01/2005  DATE OF DISCHARGE:                                HISTORY & PHYSICAL   ELECTROPHYSIOLOGIST:  Dr. Lewayne Bunting.   CARDIOLOGIST:  Dr. Sherlyn Lick [facility].   Dr. Benedetto Goad and [facility] care of his primary medical needs.   ALLERGIES:  He has allergy to Sulfa.   PRESENTING CIRCUMSTANCE:  I am here to have my battery changed; the warning  light has been on for 3 months.   HISTORY OF PRESENT ILLNESS:  Mr. Sochacki is a 77 year old male with a history  of myocardial infarction back in 1985.  He has ischemic cardiomyopathy.  He  has a history of sustained monomorphic ventricular tachycardia.  For both of  these indications a cardioverter defibrillator was implanted in 1996.  This  generator was changed in 2001.  He returned to the office at [facility] October 26, 2005.  Interrogation of the device showed that it was at  elective replacement indicator.   Interrogation also demonstrates that the cardioverter defibrillator has not  discharged but there were 15 anti-tachycardiac pacing episodes for  ventricular tachycardia.  The patient is apparently not aware of tachycardia  and says he is not having palpitation.   The patient's overall activity level is good.  He has class I-II congestive  heart failure symptoms.  He has no symptoms of recent chest pain or syncope.  He sleeps on 2 pillows a night but that is for GERD.  Thromboembolic risk  factors include hypertension, dyslipidemia.  Cardiac risk and equivalents  include prior myocardial infarction in 1985.   ALLERGIES:  SULFA DRUGS.   MEDICATIONS:  1.  Lorazepam 0.5 mg, one-half tablet twice daily.  2.  Imdur 30 mg daily.  3.  Digoxin  0.125 mg daily.  4.  Omeprazole 20 mg daily.  5.  Multivitamin daily.  6.  Enteric coated aspirin 81 mg daily.  7.  Furosemide 20 mg daily.  8.  Metoprolol 50 mg tablets, one-half tab twice daily.  9.  As of July 17th, potassium 10 mEq daily.  His potassium found to be 3.4      at that time.   SOCIAL HISTORY:  The patient lives in [place] with his wife.  He is a  retired Naval architect.  He does not partake of alcoholic beverages.  He does  not partake of tobacco having quit over 22 years ago.   REVIEW OF SYMPTOMS:  The patient is not having fevers, chills or night  sweats.  He has no apparent weight change lately.  HEENT:  No hoarseness.  No vertigo.  No photophobia.  No hearing loss.  INTEGUMENT:  The patient is  not experiencing any non-healing ulcerations to the lower extremities.  CARDIOPULMONARY:  No chest pain.  No dyspnea on exertion.  Two  pillows but  this is for GERD.  Only very occasional edema.  No syncope or pre-syncope.  Palpitations but this is secondary to nonsustained ventricular tachycardia.  UROGENITAL:  He does have nocturia.  He was admitted in 2005 with  epididymitis but he is not having any testicular pain currently.  NEUROPSYCHIATRIC:  No depression or anxiety.  No weakness or numbness.  GI:  He has a history of GERD for which he takes omeprazole and sleeps upright on  2 pillows.  He has never had a history of GI bleeding.  ENDOCRINE:  No  history of diabetes or thyroid disease.  MUSCULOSKELETAL:  No marked  arthralgias or joint swelling.   PHYSICAL EXAMINATION:  GENERAL:  The patient is alert and oriented x3 in no  acute distress.  VITAL SIGNS:  Temperature 97, pulse 60 and regular, blood pressure 113/70,  respirations are 18.  Oxygen saturation 97% on room air.  HEENT:  Normocephalic, atraumatic.  Eyes - pupils equal, round and reactive  to light.  Extraocular movements intact.  Sclerae clear.  Nares without  discharge.  NECK:  Supple.  No carotid bruits  auscultated.  No thyromegaly.  No jugular  venous distention.  HEART:  Regular rate and rhythm without murmur.  No rubs or gallops.  LUNGS:  Clear to auscultation and percussion bilaterally.  ABDOMEN:  Soft, nondistended.  No rebound or guarding.  No  hepatosplenomegaly.  Abdominal aorta is non-pulsatile.  EXTREMITIES:  Dampened pedal pulses and dorsalis pedis, 2/4 bilaterally.  Radial pulses 4/4 bilaterally.  No clubbing, cyanosis or edema.  MUSCULOSKELETAL:  No joint deformities.  NEUROLOGIC:  Cranial nerves II-XII grossly intact.   LABORATORY STUDIES:  Have been taken July 17th.  The patient is not on  Coumadin.   IMPRESSION:  1.  Admitted with cardioverter defibrillator which is a Guidant model      elective replacement indicator.  2.  History of myocardial infarction in 1985.  3.  Cardioverter defibrillator implanted in 1996 for monomorphic ventricular      tachycardia and ischemic cardiomyopathy.  4.  Hypertension.  5.  Status post cholecystectomy.  6.  Benign prostatic hypertrophy/transurethral resection of prostate.  7.  Gastroesophageal reflux disease.   PLAN:  The patient admitted for generator change for his Guidant  cardioverter defibrillator.      Maple Mirza, P.A.    ______________________________  Doylene Canning. [name], M.D.    GM/MEDQ  D:  11/01/2005  T:  11/01/2005  Job:  [id number]

## 2010-08-28 NOTE — Cardiovascular Report (Signed)
[name], George Acosta NO.:  [id number]   MEDICAL RECORD NO.:  [id number]          PATIENT TYPE:  INP   LOCATION:  2908                         FACILITY:  [facility]   PHYSICIAN:  Charlton Haws, M.D.     DATE OF BIRTH:  01-01-1929   DATE OF PROCEDURE:  DATE OF DISCHARGE:                              CARDIAC CATHETERIZATION   PROCEDURE:  Coronary arteriography.   INDICATIONS:  History of ischemic cardiomyopathy, sustained VT with multiple  AICD firings.   Cine catheterization was done with 6-French catheters in the right femoral  artery. At the end of the case, we Angio-Sealed the arteriotomy site with  good hemostasis.   The left main coronary artery had a 40% ostial stenosis.  There was no  catheter damping.   Left anterior descending artery was normal.  First, second and third  diagonal branches were normal.   The circumflex coronary artery had a 50% ostial lesion.  There was a single  obtuse marginal branch with 20-30% __________ lesion and a single AV groove  branch.   There were left-to-right collaterals primarily from the circumflex to the  distal right.   The right coronary artery was chronically occluded proximally.   Aortic pressure is 121/60, LV pressure was 120/10.   The patient had a 2-D echocardiogram yesterday. His EF was 50% with inferior  wall hypokinesis.   IMPRESSION:  The patient does not have any acute ischemic territories   Dr. [name] will reassess his antiarrhythmic medications. I suspect he will  taper his beta-blocker down and start Betapace.   The patient is currently on lidocaine and not having any sustained VT.           ______________________________  Charlton Haws, M.D.     PN/MEDQ  D:  11/23/2005  T:  11/23/2005  Job:  [id number]

## 2010-08-28 NOTE — Assessment & Plan Note (Signed)
[facility]                           ELECTROPHYSIOLOGY OFFICE NOTE   NAME:[name], [name]                       MRN:          [id number]  DATE:10/26/2005                            DOB:          1928/12/14    George Acosta returns today for followup.  He is a very pleasant elderly male  with a history of sustained monomorphic VT and ischemic cardiomyopathy,  status post ICD insertion, with the most recent device placed in 2001.  We  have not seen him since April of 2006.  He returns today for followup.  He  denies intercurrent ICD shocks, although he has had 15 episodes of VT which  were successfully pace terminated.  His device is now at [facility] and reached  this back in May.  P and R waves were 1.6 and 4.4.  Of note, the R waves had  been stable for many years.  The pacing impedance was 468 in the atrium, 545  in the ventricle.  The threshold was 0.6 at 0.4 in the atrium, 1 at 0.4 in  the ventricle.  Battery voltage was 2.38 volts, and charge time was 15.9  seconds.   IMPRESSION:  1.  Ischemic cardiomyopathy.  2.  VT.  3.  Syncope secondary to #2.  4.  Congestive heart failure, presently class II.   DISCUSSION:  George Acosta is stable, but his defibrillator battery is at [facility].  I have discussed treatment options with him, and will proceed with change  out of his ICD in the next convenient period.                                   Doylene Canning. [name], MD   GWT/MedQ  DD:  10/26/2005  DT:  10/26/2005  Job #:  [id number]   cc:   Harl Bowie, MD  Gilford Rile. Benedetto Goad, MD

## 2010-08-28 NOTE — Discharge Summary (Signed)
NAMEKELVYN, SCHUNK NO.:  [id number]   MEDICAL RECORD NO.:  [id number]          PATIENT TYPE:  INP   LOCATION:  3737                         FACILITY:  [facility]   PHYSICIAN:  Farris Has. Dorethea Clan, MD  DATE OF BIRTH:  23-Sep-1928   DATE OF ADMISSION:  11/21/2005  DATE OF DISCHARGE:  11/25/2005                                 DISCHARGE SUMMARY   This dictation is a 35 minute dictation with examination on dictation.   ALLERGIES:  TO SULFA.   PRINCIPAL DIAGNOSES:  1. Admitted with cardioverted defibrillator shocks.  (a.)  Sustained monomorphic ventricular tachycardia.  (b.)  Treated with IV lidocaine then switched to Betapace, which was started  November 23, 2005.  (c.)  Hypotension on Betapace, holding Imdur.  1. Coronary artery disease.  Troponin-I studies this admit 0.38, then      1.06, then 0.62.  (a.)  Catheterization November 23, 2005 demonstrating 100% proximal right  coronary artery occlusion.  The left main had a 40% ostial stenosis.  The  left anterior descending and the first, second and third diagonal branches  were normal.  The left circumflex had a 50% ostial lesion.  There was a  single obtuse marginal with a 20% to 30% proximal lesion.  There were left  to right collaterals from the circumflex to the distal right coronary  artery.  Ejection fraction 50% on 2D echocardiogram.  (b.)  Echocardiogram November 22, 2005.  Ejection fraction 50%.  Hypokinesis  of the inferior wall.  (c.)  history of inferior myocardial infarction 1985.  (d.)  ICD implanted in 1996.  Last generator change was November 01, 2005.  He  has a Guidant Vitality II.  (e.)  Quest I-II congestive heart failure.  1. Sinus bradycardia - conduction system disease.   SECONDARY DIAGNOSES:  1. Hypertension.  2. Dyslipidemia.  3. Benign prostatic hypertrophy - TUR.  4. Gastroesophageal reflux disease.  5. Recent exploratory laparotomy for small bowel obstruction, November 21, 2005,  discharged.   PROCEDURES:  1. November 22, 2005 echocardiogram.  Ejection fraction 50%.  Hypokinesis of      the inferior wall.  2. Left heart catheterization, November 23, 2005, study is dictated above.   BRIEF HISTORY:  Mr. Ladouceur is a 77 year old male.  He has a history of  ischemic cardiomyopathy and a history of monomorphic ventricular  tachycardia.  He had an ICD implanted for sustained monomorphic ventricular  tachycardia.  His last generator change was November 01, 2005.   A week and a half after the generator change, he experienced GI distress.  He was hospitalized at [facility] with diagnosis of small bowel  obstruction and underwent exploratory laparotomy.  While hospitalized, he  had an ICD shock.  Upon discharge home, November 21, 2005 the day of this  admission, he sustained multiple ICD shocks.   Prior to these shocks, the patient had no symptoms of shortness of breath,  lightheadedness or dizziness.  He reports that his cardioverted  defibrillator was turned off for surgery, but then turned back on.  He  does  not think any programming changes were made.  Emergency room workup was  significant for a potassium of 3.5.  Patient's device was interrogated in  the emergency room and demonstrated that the ventricular defibrillation zone  had shocked twice appropriately for that.  The patient has also had  antitachycardia pacing x3, all appropriate for ventricular tachycardia.   It is possible that the patient had been off his antiarrhythmic medications  during the surgery and also had electrolyte shifting as a result of the  surgery.  Antiarrhythmic therapy will be restarted, but immediately he will  be placed on IV lidocaine.   HOSPITAL COURSE:  Patient presented to the emergency room at [facility] November 21, 2005, the day he was discharged from [facility]  after undergoing exploratory laparotomy for a small bowel obstruction.  The  patient received  appropriate ICD discharges for ventricular  fibrillation/ventricular tachycardia with some episodes of successful  antitachycardia pacing.  The patient was placed on IV lidocaine in the  emergency room.  Troponin-I values were cycled.  As mentioned, they were  0.38, 1.06 and 0.62.  He was seen by Dr. Sherryl Manges on November 22, 2005, it  was determined that it had been quite a while since his last  catheterization, which was in 1998 and ischemic source had to be ruled out,  so the patient underwent both echocardiogram on November 22, 2005, as well as  being set up for left heart catheterization.  Left heart catheterization was  done on November 23, 2005.  The study showed no dramatic changes from  catheterization, which was done back in 1998 and medical therapy is  continuing for Mr. Wilkinson.  In addition to the appropriate discharges from  the ICD, there were one or two inappropriate discharges and to address this,  device was reprogrammed to increase the TVAR interval.  In addition to the  TVAR adjustment antitachycardia pacing was set for ventricular tachycardia  at less than or equal to 230.  The AV delay was changed and there is a  suggestion that the patient would benefit from antiarrhythmic drugs.  Mr.  Puffenbarger was started on sotalol 120 mg twice daily on November 23, 2005.  He  became somewhat hypotensive with a full compliment of medications he was  taking and so his Imdur has been placed on hold.  In addition, his beta  blocker metoprolol has not been restarted.  The patient's ventricular  arrhythmias have been quiescent ever since starting sotalol and weaning IV  lidocaine and patient has been observed on telemetry for a period of 48  hours and will discharge November 25, 2005.   DISCHARGE MEDICATIONS:  His discharge medications include:  1. Sotalol 120 mg one tablet in the morning and one tablet in the evening.      This is a new medication.  2. Furosemide 20 mg daily. 3. Omeprazole 20  mg daily.  4. Enteric-coated aspirin 81 mg daily.  5. Potassium chloride 20 mEq  two tabs daily.  This is also a new      medication.  6. Lorazepam 0.5 mg as needed.  7. He is asked to hold off on taking Imdur and to hold off on taking      metoprolol.   FOLLOWUP:  He is to followup with Dr. Lewayne Bunting at [facility] on  Thursday, December 16, 2005 at 12:30.   LABORATORY STUDIES THIS ADMISSION:  On the day of discharge, serum  electrolytes  sodium 136, potassium 4.0, chloride 101, carbonate 26, BUN is  9, creatinine 1.0, glucose is 87.  Complete blood count:  White cell is 6.2,  hemoglobin 12.5, hematocrit 36.5 and platelets 327.  The BNP on November 22, 2005 was 93.  TSH this admission was 2.569.     ______________________________  Maple Mirza, PA      Farris Has. Dorethea Clan, MD  Electronically Signed    GM/MEDQ  D:  11/25/2005  T:  11/25/2005  Job:  [id number]   cc:   Doylene Canning. [name], MD  Gilford Rile Benedetto Goad, M.D.

## 2010-08-28 NOTE — Consult Note (Signed)
George Acosta, YAMASHIRO NO.:  [id number]   MEDICAL RECORD NO.:  [id number]          PATIENT TYPE:  INP   LOCATION:  2025                         FACILITY:  [facility]   PHYSICIAN:  Rozanna Boer., M.D.DATE OF BIRTH:  07-04-1928   DATE OF CONSULTATION:  03/05/2004  DATE OF DISCHARGE:  03/05/2004                                   CONSULTATION   REASON FOR CONSULTATION:  Right epididymitis, UTI.   HISTORY OF PRESENT ILLNESS:  This 77 year old patient was admitted on  March 04, 2004 by Dr. Doylene Canning. George Acosta with history of ventricular  fibrillation and apparent UTI.  About two days ago, the patient presented to  [facility] with painful burning, hesitancy, urgency, and some right scrotal  swelling.  Apparently during the exam by a surgeon, his ventricular  defibrillator went off.  His cardiac rhythm has been stable as he has  ischemic heart disease.  He has had an ICD diagnosed in 1996 and he has the  defibrillator implanted for some time and this is the first time it has gone  off.  He still has difficulty voiding but it burns a little bit less and he  has been started on Cipro.  He did grow out E. coli urinary tract infection  that is pansensitive.  His BUN is 21, creatinine is 1.3.  His hematocrit is  40%, white count is 12,000, and his urinalysis is positive for nitrites and  did grow out E. coli.  The bacteria was pansensitive to all the antibiotics  tested.   The patient has been afebrile since he has been in the office but is still  having difficulty voiding.  He says he gets up typically twice a night.  He  has had a urinary tract infection in the past.  He thinks several months  ago but could not tell me exactly when.  These have not been febrile by  history.  He does not do any lifting or straining.  He is a retired Ecologist.   MEDICATIONS:  Cipro IV, Rocephin that he was started on down in [place],  digoxin, Lasix, Prilosec, Coreg, Ativan,  metoprolol, Lisinopril, Isosorbide,  aspirin, multivitamins, and nitroglycerin p.r.n.   ALLERGIES:  SULFA.   PAST SURGICAL HISTORY:  1.  Cholecystectomy in 1980.  2.  TURP in the distant past.   REVIEW OF SYMPTOMS:  Does have GERD and history of hypertension.  Does not  smoke but does have an 85-pack-year history of smoking.  Does not drink  alcohol.  Lives with his wife in [place].  Does not smoke or drink as  above.  The patient has had congestive heart failure in the past but not  recently.   FAMILY HISTORY:  He has one sibling with cardiovascular disease.  Father  died in his 57s.  Mother died at age 26.   PHYSICAL EXAMINATION:  VITAL SIGNS:  Temperature was 100.4 on admission,  blood pressure is 98/56, pulse 74 and regular, respirations 18.  GENERAL:  He is a pleasant, elderly white male in no acute distress.  HEENT:  Clear.  Oropharynx is benign.  CHEST:  Clear.  ABDOMEN:  Soft.  Liver and spleen not palpable.  Pacemaker is palpable in  the left upper quadrant.  Bladder is not distended.  GENITAL:  Penis uncircumcised.  He has right scrotal swelling and this seems  to be limited to the epididymidis posteriorly.  Does have atrophic testes  bilaterally.  The left side feels normal.  No CVA pain.  His prostate is  moderately enlarged but benign.  Not fixed or induration.  [place] vesicles  not palpated.  EXTREMITIES:  Faint distal pulses.  Intact sensation to light touch.  No  edema.   IMPRESSION:  1.  Right epididymitis.  2.  Obstructive uropathy.  3.  Escherichia coli urinary tract infection.   RECOMMENDATIONS:  We will keep him on Cipro for probably a two-week period.  He could be switched from IV to p.o. at any time as it is concentrated well  in the urine.  We will also add Flomax to help him empty his bladder better  and check a scrotal and renal ultrasound while he is here.  If these are  okay and he is stabilized, he could probably go home tomorrow on  antibiotics  for about two weeks and I will check him in the office in about a week after  that to make sure he is doing well at that time.       HMK/MEDQ  D:  03/05/2004  T:  03/05/2004  Job:  [id number]

## 2010-08-28 NOTE — H&P (Signed)
George Acosta, [place] NO.:  [id number]   MEDICAL RECORD NO.:  [id number]          PATIENT TYPE:  INP   LOCATION:  2025                         FACILITY:  [facility]   PHYSICIAN:  Doylene Canning. [name], M.D.  DATE OF BIRTH:  1929/04/09   DATE OF ADMISSION:  03/04/2004  DATE OF DISCHARGE:                                HISTORY & PHYSICAL   ADMITTING DIAGNOSIS:  Ventricular tachycardia in the setting of urinary  tract infection with sepsis type syndrome and probable epididymidis.   HISTORY OF PRESENT ILLNESS:  The patient is a very pleasant 77 year old male  with longstanding ischemic cardiomyopathy and ventricular tachycardia.  Despite this he has had class I-II heart failure symptoms.  The patient has  been followed by me in defibrillator clinic having been most recently seen  back in May.  At that time he was found to be at a middle of life 2 with  regard to his defibrillator.  The patient was in his usual state of health  until approximately 36 hours ago, when he had fevers, chills, and dysuria.  He noted that his urine became cloudy and discolored.  Because of all of the  above, he presented to the emergency department in [facility] where he  was found to be hypotensive with systolic blood pressures in the 70s.  Evaluation there demonstrated evidence of a severe urinary tract infection  ultimately growing out gram negative rods which were E. coli.  He was  treated with IV antibiotics and fluid resuscitation.  There was concern that  the patient had an inguinal hernia because of scrotal swelling and a CT scan  was obtained which demonstrated questionable evidence of partial small-bowel  obstruction, and an enlarged prostate, and modest sigmoid diverticulosis.  There was no obvious free air and no abscess noted.  Tonight the patient was  being evaluated by one of the general surgeons down in [place], who at the  time was examining his testicular area.  This was quite  tender to the  patient and he subsequently developed a sustained monomorphic VT which was  treated with ICD therapy, after multiple episodes of anti-tachycardic pacing  failed to terminate his VT.  He is now transferred for additional  evaluation.  The patient denies chest pain or shortness of breath.  He  denies any recent history of ICD shocks or syncope.   PAST MEDICAL HISTORY:  As previously noted.  In addition, he has:  1.  History of myocardial infarction in 1985.  2.  He underwent ICD implantation initially in 1996 and underwent ICD      generator change back in April 2001. Until tonight he has had no      recurrent ICD therapies.  3.  Cholecystectomy in the distant past.  4.  History of BPH.  5.  He is status post TURP.  6.  Hypertension.  7.  Gastroesophageal reflux disease.   SOCIAL HISTORY:  The patient lives with his wife in __________  .  He is a  retired Naval architect.  He has a history of tobacco use  but stopped smoking  20 years ago.  He denies alcohol abuse.   FAMILY HISTORY:  Notable for a mother who died in her 55s, and a father  dying in his 82s.   REVIEW OF SYSTEMS:  Notable for fevers and chills and night sweats.  He  denies any vision or hearing problems.  He denies skin rashes.  He denies  chest pain, dyspnea on exertion, or PND.  He denies claudication, cough, or  hemoptysis.  He denies weakness, numbness, or depression, or anxiety.  He  does have dysuria and urinary urgency.  He denies arthralgias or other joint  problems.  He denies nausea, vomiting, diarrhea, or constipation but does  have reflux symptoms.  The rest of his review of systems was negative.   PHYSICAL EXAMINATION:  GENERAL:  He is a pleasant, well-appearing, 77-year-  old man in no distress.  VITAL SIGNS:  Blood pressure was 98/56, the pulse 74 and regular,  respirations were 20, temp was 100.4, weight was 165 pounds, oxygen  saturation 100%.  HEENT:  Normocephalic and atraumatic.  Pupils  equal and round.  The  oropharynx was moist.  The sclerae were anicteric.  NECK:  Revealed no jugular venous distention.  There was no thyromegaly.  The trachea was in the midline.  CARDIOVASCULAR:  Revealed a regular rate and rhythm with normal S1 and S2.  There are no murmurs, rubs or gallops appreciated.  His defibrillator  incision was healed nicely.  LUNGS:  Clear bilaterally to auscultation.  No wheezes, rales, or rhonchi  were present.  ABDOMEN:  Soft, nontender, nondistended.  There was no organomegaly present.  The bowel sounds were present.  GENITOURINARY:  Demonstrated a markedly enlarged testicles and scrotal sack  which was erythematous and tender to palpation.  EXTREMITIES:  Demonstrated no cyanosis, clubbing, or edema.  The pulses were  2+ and symmetric.  NEUROLOGIC:  Nonfocal with cranial nerves II-XII grossly intact and the  strength was 5/5 and symmetric.   The EKG demonstrates a sinus rhythm with normal axis and intervals.   LABORATORY:  Revealed a white count of 10.5.  Hemoglobin was 12.8.  The  creatinine was normal.  His bilirubin was minimally elevated.  The rest of  his liver panel was negative.  Urinalysis demonstrated greater than 100,000  gram negative rods which were E. coli and sensitive to most antibiotics.   IMPRESSION:  1.  Sustained monomorphic ventricular tachycardia, off of beta-blocker which      have been stopped secondary to the patient's urosepsis.  2.  Status post implantable cardioverter-defibrillator therapy secondary to      number one.  3.  Urinary tract infection.  4.  Scrotal swelling, questionable epididymidis.  5.  Ischemic cardiomyopathy.  6.  Status post implantable cardioverter-defibrillator insertion in the      past, approaching device elective replacement indication.   PLAN:  1.  Continue his IV antibiotics (ciprofloxacin). 2.  We will plan to restart his beta-blocker therapy in low dose with Toprol      XL 25 mg a day. Of  note, the patient had been considered to be started      on Coreg and while this is a consideration, he really has not had much      in the way of heart failure symptoms despite his poor LV systolic      function.  3.  I discussed the patient's symptoms and exam with Dr. Aldean Ast who was  on call for urology, and we will      plan to obtain a scrotal and renal ultrasound, and he will be evaluated      by the urology service in the morning.  4.  We will also plan to interrogate his ICD, but we will hold off on any      generator changes at the present time until his ongoing infection has      stabilized.       GWT/MEDQ  D:  03/04/2004  T:  03/04/2004  Job:  [id number]   cc:   Elease Hashimoto A. Benedetto Goad, M.D.  [address]  [place]  [place] [address]  Fax: [phone]   Heide Guile, MD  [address]  [place]  [place] [address]  Fax: [phone]

## 2010-09-17 ENCOUNTER — Ambulatory Visit (INDEPENDENT_AMBULATORY_CARE_PROVIDER_SITE_OTHER): Payer: Self-pay | Admitting: *Deleted

## 2010-09-17 ENCOUNTER — Other Ambulatory Visit: Payer: Self-pay | Admitting: Internal Medicine

## 2010-09-17 DIAGNOSIS — I472 Ventricular tachycardia: Secondary | ICD-10-CM

## 2010-09-24 NOTE — Progress Notes (Signed)
icd remote check  

## 2010-10-02 ENCOUNTER — Encounter: Payer: Self-pay | Admitting: *Deleted

## 2010-12-17 ENCOUNTER — Encounter: Payer: Self-pay | Admitting: *Deleted

## 2010-12-17 ENCOUNTER — Telehealth: Payer: Self-pay | Admitting: *Deleted

## 2010-12-17 ENCOUNTER — Encounter: Payer: Self-pay | Admitting: Internal Medicine

## 2010-12-17 NOTE — Telephone Encounter (Signed)
Latitude transmission shows oversensing of noise on v-lead.  Patient to come in for check 12/25/10 and is in agreement.

## 2010-12-25 ENCOUNTER — Encounter: Payer: Self-pay | Admitting: Internal Medicine

## 2010-12-25 ENCOUNTER — Ambulatory Visit (INDEPENDENT_AMBULATORY_CARE_PROVIDER_SITE_OTHER): Payer: Self-pay | Admitting: Internal Medicine

## 2010-12-25 DIAGNOSIS — I2510 Atherosclerotic heart disease of native coronary artery without angina pectoris: Secondary | ICD-10-CM

## 2010-12-25 DIAGNOSIS — I472 Ventricular tachycardia: Secondary | ICD-10-CM

## 2010-12-25 DIAGNOSIS — Z95810 Presence of automatic (implantable) cardiac defibrillator: Secondary | ICD-10-CM

## 2010-12-25 DIAGNOSIS — I509 Heart failure, unspecified: Secondary | ICD-10-CM

## 2010-12-25 LAB — ICD DEVICE OBSERVATION
AL AMPLITUDE: 1.1 mv
AL IMPEDENCE ICD: 478 Ohm
BATTERY VOLTAGE: 2.57 V
DEV-0020ICD: NEGATIVE
HV IMPEDENCE: 50 Ohm
RV LEAD IMPEDENCE ICD: 512 Ohm
TZAT-0001FASTVT: 1
TZAT-0001SLOWVT: 2
TZAT-0013FASTVT: 1
TZAT-0013SLOWVT: 2
TZAT-0018FASTVT: NEGATIVE
TZAT-0018FASTVT: NEGATIVE
TZAT-0018SLOWVT: NEGATIVE
TZST-0001FASTVT: 3
TZST-0001FASTVT: 4
TZST-0001FASTVT: 7
TZST-0001SLOWVT: 3
TZST-0001SLOWVT: 4
TZST-0003FASTVT: 31 J
TZST-0003FASTVT: 31 J
TZST-0003SLOWVT: 29 J

## 2010-12-25 NOTE — Assessment & Plan Note (Signed)
He has brief episodes of noise on the ventricular lead. I have reprogrammed the device to prevent ICD shocks due to noise. I have reduced his sensitivity and increase his duration to prevent unnecessary shocks. I will see him back in 3 months and if he receives any shocks due to noise, then I would recommend ventricular lead revision.

## 2010-12-25 NOTE — Assessment & Plan Note (Signed)
He denies anginal symptoms. 

## 2010-12-25 NOTE — Patient Instructions (Signed)
Your physician recommends that you schedule a follow-up appointment in Dec 2012

## 2010-12-25 NOTE — Progress Notes (Signed)
HPI Mr. Supak returns today for followup. He is a pleasant 77 yo man with a h/o VT, ICM, CHF, s/p ICD implant in 1996. He has been noted to have noise on the ventricular lead. He has not had any ICD shocks. He notes some weight loss. His appetite has not been good. He denies peripheral edema. Allergies  Allergen Reactions  . Sulfonamide Derivatives      Current Outpatient Prescriptions  Medication Sig Dispense Refill  . aspirin 81 MG tablet Take 81 mg by mouth daily.        . furosemide (LASIX) 20 MG tablet Take 20 mg by mouth 2 (two) times daily.        Marland Kitchen LORazepam (ATIVAN) 0.5 MG tablet Take 0.5 mg by mouth every 8 (eight) hours.        . Multiple Vitamin (MULTI VITAMIN MENS PO) Take by mouth.        . nitroGLYCERIN (NITROSTAT) 0.4 MG SL tablet Place 0.4 mg under the tongue every 5 (five) minutes as needed.        . pantoprazole (PROTONIX) 40 MG tablet Take 40 mg by mouth daily.        . potassium chloride SA (K-DUR,KLOR-CON) 20 MEQ tablet Take 20 mEq by mouth daily.        . ranitidine (ZANTAC) 300 MG tablet Take 300 mg by mouth at bedtime.        . sotalol (BETAPACE) 120 MG tablet Take 1 tablet (120 mg total) by mouth 2 (two) times daily.  180 tablet  3     Past Medical History  Diagnosis Date  . Congestive heart failure, unspecified   . Coronary atherosclerosis of unspecified type of vessel, native or graft   . Esophageal reflux   . Unspecified essential hypertension   . Benign prostatic hypertrophy   . Acute myocardial infarction, unspecified site, episode of care unspecified   . Peptic ulcer disease   . Arrhythmia     paroxysmal afib  . Diverticulosis   . SOB (shortness of breath)     ROS:   All systems reviewed and negative except as noted in the HPI.   Past Surgical History  Procedure Date  . Insert / replace / remove pacemaker      Family History  Problem Relation Age of Onset  . Heart disease Mother   . Heart disease Daughter      History   Social  History  . Marital Status: Widowed    Spouse Name: N/A    Number of Children: N/A  . Years of Education: N/A   Occupational History  . Not on file.   Social History Main Topics  . Smoking status: Former Games developer  . Smokeless tobacco: Not on file  . Alcohol Use: Not on file  . Drug Use: Not on file  . Sexually Active: Not on file   Other Topics Concern  . Not on file   Social History Narrative   The patient lives in [place]. He is widowed. 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 No wheezes. Well healed ICD incision. HEART:  Regular rate rhythm, no murmurs, no rubs, no clicks Abd:  soft, positive bowel sounds, no organomegally, no rebound, no guarding Ext:  2 plus pulses, no edema, no cyanosis, no clubbing Skin:  No rashes no nodules Neuro:  CN II through XII intact, motor grossly intact  DEVICE  Normal device function.  See PaceArt for details. Noise is present on the ventricular lead which is nonsustained.  Assess/Plan:

## 2011-03-25 ENCOUNTER — Encounter: Payer: Self-pay | Admitting: *Deleted

## 2011-04-19 ENCOUNTER — Encounter: Payer: Self-pay | Admitting: Internal Medicine

## 2011-04-21 ENCOUNTER — Encounter: Payer: Self-pay | Admitting: Internal Medicine

## 2011-04-21 ENCOUNTER — Encounter: Payer: Self-pay | Admitting: *Deleted

## 2011-04-21 ENCOUNTER — Encounter (HOSPITAL_COMMUNITY): Payer: Self-pay | Admitting: Pharmacy Technician

## 2011-04-21 ENCOUNTER — Ambulatory Visit (INDEPENDENT_AMBULATORY_CARE_PROVIDER_SITE_OTHER): Payer: Self-pay | Admitting: Internal Medicine

## 2011-04-21 DIAGNOSIS — Z95810 Presence of automatic (implantable) cardiac defibrillator: Secondary | ICD-10-CM

## 2011-04-21 DIAGNOSIS — I472 Ventricular tachycardia: Secondary | ICD-10-CM

## 2011-04-21 DIAGNOSIS — T82198A Other mechanical complication of other cardiac electronic device, initial encounter: Secondary | ICD-10-CM

## 2011-04-21 DIAGNOSIS — I2589 Other forms of chronic ischemic heart disease: Secondary | ICD-10-CM

## 2011-04-21 LAB — ICD DEVICE OBSERVATION
AL AMPLITUDE: 1.1 mv
AL IMPEDENCE ICD: 463 Ohm
ATRIAL PACING ICD: 47 pct
BATTERY VOLTAGE: 2.56 V
DEV-0020ICD: NEGATIVE
RV LEAD IMPEDENCE ICD: 478 Ohm
RV LEAD THRESHOLD: 1.4 V
TZAT-0001FASTVT: 1
TZAT-0013FASTVT: 1
TZAT-0013SLOWVT: 2
TZAT-0018FASTVT: NEGATIVE
TZAT-0018SLOWVT: NEGATIVE
TZST-0001FASTVT: 3
TZST-0001FASTVT: 7
TZST-0001SLOWVT: 3
TZST-0001SLOWVT: 4
TZST-0003FASTVT: 31 J
TZST-0003FASTVT: 31 J
TZST-0003SLOWVT: 23 J

## 2011-04-21 LAB — BASIC METABOLIC PANEL
BUN: 12 mg/dL (ref 6–23)
CO2: 31 mEq/L (ref 19–32)
Calcium: 9.1 mg/dL (ref 8.4–10.5)
Chloride: 105 mEq/L (ref 96–112)
Creatinine, Ser: 0.9 mg/dL (ref 0.4–1.5)
Glucose, Bld: 89 mg/dL (ref 70–99)

## 2011-04-21 LAB — CBC WITH DIFFERENTIAL/PLATELET
Basophils Absolute: 0 10*3/uL (ref 0.0–0.1)
Basophils Relative: 0.6 % (ref 0.0–3.0)
Eosinophils Absolute: 0.1 10*3/uL (ref 0.0–0.7)
Lymphocytes Relative: 27 % (ref 12.0–46.0)
MCHC: 33.3 g/dL (ref 30.0–36.0)
MCV: 95.5 fl (ref 78.0–100.0)
Monocytes Absolute: 0.5 10*3/uL (ref 0.1–1.0)
Neutrophils Relative %: 60.3 % (ref 43.0–77.0)
Platelets: 263 10*3/uL (ref 150.0–400.0)
RBC: 4.18 Mil/uL — ABNORMAL LOW (ref 4.22–5.81)
RDW: 14.6 % (ref 11.5–14.6)

## 2011-04-21 LAB — PROTIME-INR
INR: 1.1 ratio — ABNORMAL HIGH (ref 0.8–1.0)
Prothrombin Time: 12.4 s (ref 10.2–12.4)

## 2011-04-21 NOTE — Patient Instructions (Signed)
Patient is being scheduled for an ICD generator change and LV lead revision

## 2011-04-21 NOTE — Assessment & Plan Note (Signed)
The patient had no recurrent ventricular arrhythmias. We will continue his current medical therapy.

## 2011-04-21 NOTE — Progress Notes (Signed)
HPI [name] returns today for followup. He is a very pleasant elderly man with a long-standing ischemic cardiomyopathy, chronic systolic heart failure, ventricular tachycardia, status post ICD implantation. He denies chest pain or syncope. He has class II congestive heart failure symptoms. He denies peripheral edema. He has received no recent ICD shocks. Allergies  Allergen Reactions  . Sulfonamide Derivatives      Current Outpatient Prescriptions  Medication Sig Dispense Refill  . aspirin 81 MG tablet Take 81 mg by mouth daily.        . furosemide (LASIX) 20 MG tablet Take 20 mg by mouth daily.       Marland Kitchen LORazepam (ATIVAN) 0.5 MG tablet Take 0.5 mg by mouth every 8 (eight) hours. For anxiety. Twice a day & at bedtime      . Multiple Vitamin (MULTI VITAMIN MENS PO) Take by mouth.        . nitroGLYCERIN (NITROSTAT) 0.4 MG SL tablet Place 0.4 mg under the tongue every 5 (five) minutes as needed.        . pantoprazole (PROTONIX) 40 MG tablet Take 40 mg by mouth 2 (two) times daily.       . potassium chloride SA (K-DUR,KLOR-CON) 20 MEQ tablet Take 20 mEq by mouth daily.        . ranitidine (ZANTAC) 300 MG tablet Take 300 mg by mouth at bedtime.        . sotalol (BETAPACE) 120 MG tablet Take 1 tablet (120 mg total) by mouth 2 (two) times daily.  180 tablet  3     Past Medical History  Diagnosis Date  . Congestive heart failure, unspecified   . Coronary atherosclerosis of unspecified type of vessel, native or graft   . MI (myocardial infarction) 1985  . Unspecified essential hypertension   . Benign prostatic hypertrophy   . Acute myocardial infarction, unspecified site, episode of care unspecified   . Peptic ulcer disease   . Arrhythmia     paroxysmal afib  . Diverticulosis   . SOB (shortness of breath)   . Ventricular tachycardia      monomorphic VT  . GERD (gastroesophageal reflux disease)   . Ischemic cardiomyopathy     ROS:   All systems reviewed and negative except as noted  in the HPI.   Past Surgical History  Procedure Date  . Icd 1996    with a generator change in 2001  . Transthoracic echocardiogram 11/2005  . Laparotomy 11/2005    for small bowel obstruction,  . Cardiac catheterization 11/23/2005     Family History  Problem Relation Age of Onset  . Heart disease Mother   . Heart disease Daughter      History   Social History  . Marital Status: Widowed    Spouse Name: N/A    Number of Children: N/A  . Years of Education: N/A   Occupational History  . retired Naval architect    Social History Main Topics  . Smoking status: Former Games developer  . Smokeless tobacco: Not on file  . Alcohol Use: Not on file  . Drug Use: Not on file  . Sexually Active: Not on file   Other Topics Concern  . Not on file   Social History Narrative   The patient lives in [place]. He is widowed. 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 Well-healed ICD incision. HEART:  Regular rate rhythm, no murmurs, no rubs, no clicks Abd:  soft, positive bowel sounds, no organomegally, no rebound, no guarding Ext:  2 plus pulses, no edema, no cyanosis, no clubbing Skin:  No rashes no nodules Neuro:  CN II through XII intact, motor grossly intact  DEVICE  Normal device function.  See PaceArt for details. Electrical noise is present on the ICD lead. This results in oversensing.  Assess/Plan:

## 2011-04-21 NOTE — Assessment & Plan Note (Signed)
The patient has evidence of ICD lead malfunction with ventricular oversensing of electrical noise. He is approaching elective replacement. I have reprogrammed his device today to try to reduce the risk of unnecessary ICD shock due to lateral noise. I recommended that we proceed in the next few weeks with ICD lead revision. My plan would be to put a new ventricular ICD lead in, remove his old device, and insert new ICD. The risk, goals, benefits, and expectations of this procedure have been discussed with the patient and his family and they wish to proceed

## 2011-04-22 ENCOUNTER — Other Ambulatory Visit: Payer: Self-pay | Admitting: *Deleted

## 2011-04-22 DIAGNOSIS — T82118A Breakdown (mechanical) of other cardiac electronic device, initial encounter: Secondary | ICD-10-CM

## 2011-04-27 MED ORDER — CEFAZOLIN SODIUM 1-5 GM-% IV SOLN: 1.0000 g | Freq: Once | INTRAVENOUS | Status: DC

## 2011-04-27 MED ORDER — GENTAMICIN SULFATE 40 MG/ML IJ SOLN
80.0000 mg | Freq: Once | INTRAMUSCULAR | Status: DC
  Filled 2011-04-27 (×2): qty 2

## 2011-04-28 ENCOUNTER — Ambulatory Visit (HOSPITAL_COMMUNITY)
Admission: RE | Admit: 2011-04-28 | Discharge: 2011-04-29 | Disposition: A | Discharge status: Home / Self Care | Payer: Medicare HMO | Source: Ambulatory Visit | Attending: Internal Medicine | Admitting: Internal Medicine

## 2011-04-28 ENCOUNTER — Other Ambulatory Visit: Payer: Self-pay

## 2011-04-28 ENCOUNTER — Ambulatory Visit (HOSPITAL_COMMUNITY): Payer: Medicare HMO

## 2011-04-28 ENCOUNTER — Encounter (HOSPITAL_COMMUNITY): Payer: Self-pay | Admitting: Cardiology

## 2011-04-28 ENCOUNTER — Encounter (HOSPITAL_COMMUNITY)
Admission: RE | Disposition: A | Discharge status: Home / Self Care | Payer: Self-pay | Source: Ambulatory Visit | Attending: Internal Medicine

## 2011-04-28 DIAGNOSIS — I472 Ventricular tachycardia, unspecified: Secondary | ICD-10-CM | POA: Insufficient documentation

## 2011-04-28 DIAGNOSIS — Y831 Surgical operation with implant of artificial internal device as the cause of abnormal reaction of the patient, or of later complication, without mention of misadventure at the time of the procedure: Secondary | ICD-10-CM | POA: Insufficient documentation

## 2011-04-28 DIAGNOSIS — K219 Gastro-esophageal reflux disease without esophagitis: Secondary | ICD-10-CM | POA: Insufficient documentation

## 2011-04-28 DIAGNOSIS — T82110A Breakdown (mechanical) of cardiac electrode, initial encounter: Secondary | ICD-10-CM

## 2011-04-28 DIAGNOSIS — I10 Essential (primary) hypertension: Secondary | ICD-10-CM | POA: Insufficient documentation

## 2011-04-28 DIAGNOSIS — T82198A Other mechanical complication of other cardiac electronic device, initial encounter: Secondary | ICD-10-CM | POA: Insufficient documentation

## 2011-04-28 DIAGNOSIS — Z95810 Presence of automatic (implantable) cardiac defibrillator: Secondary | ICD-10-CM | POA: Insufficient documentation

## 2011-04-28 DIAGNOSIS — Z8711 Personal history of peptic ulcer disease: Secondary | ICD-10-CM | POA: Insufficient documentation

## 2011-04-28 DIAGNOSIS — I509 Heart failure, unspecified: Secondary | ICD-10-CM | POA: Insufficient documentation

## 2011-04-28 DIAGNOSIS — I4891 Unspecified atrial fibrillation: Secondary | ICD-10-CM | POA: Insufficient documentation

## 2011-04-28 DIAGNOSIS — Z4502 Encounter for adjustment and management of automatic implantable cardiac defibrillator: Secondary | ICD-10-CM | POA: Insufficient documentation

## 2011-04-28 DIAGNOSIS — Y92009 Unspecified place in unspecified non-institutional (private) residence as the place of occurrence of the external cause: Secondary | ICD-10-CM | POA: Insufficient documentation

## 2011-04-28 DIAGNOSIS — N400 Enlarged prostate without lower urinary tract symptoms: Secondary | ICD-10-CM | POA: Insufficient documentation

## 2011-04-28 DIAGNOSIS — I4729 Other ventricular tachycardia: Secondary | ICD-10-CM | POA: Insufficient documentation

## 2011-04-28 DIAGNOSIS — I2510 Atherosclerotic heart disease of native coronary artery without angina pectoris: Secondary | ICD-10-CM | POA: Insufficient documentation

## 2011-04-28 DIAGNOSIS — I5022 Chronic systolic (congestive) heart failure: Secondary | ICD-10-CM | POA: Insufficient documentation

## 2011-04-28 DIAGNOSIS — I2589 Other forms of chronic ischemic heart disease: Secondary | ICD-10-CM | POA: Insufficient documentation

## 2011-04-28 DIAGNOSIS — T82118A Breakdown (mechanical) of other cardiac electronic device, initial encounter: Secondary | ICD-10-CM

## 2011-04-28 DIAGNOSIS — R079 Chest pain, unspecified: Secondary | ICD-10-CM | POA: Insufficient documentation

## 2011-04-28 DIAGNOSIS — K5730 Diverticulosis of large intestine without perforation or abscess without bleeding: Secondary | ICD-10-CM | POA: Insufficient documentation

## 2011-04-28 HISTORY — DX: Pneumonia, unspecified organism: J18.9

## 2011-04-28 HISTORY — DX: Anxiety disorder, unspecified: F41.9

## 2011-04-28 HISTORY — PX: LEAD REVISION: SHX5945

## 2011-04-28 HISTORY — PX: IMPLANTABLE CARDIOVERTER DEFIBRILLATOR (ICD) GENERATOR CHANGE: SHX5469

## 2011-04-28 HISTORY — DX: Acute pancreatitis without necrosis or infection, unspecified: K85.90

## 2011-04-28 LAB — SURGICAL PCR SCREEN
MRSA, PCR: POSITIVE — AB
Staphylococcus aureus: POSITIVE — AB

## 2011-04-28 SURGERY — ICD GENERATOR CHANGE
Anesthesia: LOCAL

## 2011-04-28 MED ORDER — CHLORHEXIDINE GLUCONATE 4 % EX LIQD
60.0000 mL | Freq: Once | CUTANEOUS | Status: DC
  Filled 2011-04-28: qty 60

## 2011-04-28 MED ORDER — ONDANSETRON HCL 4 MG/2ML IJ SOLN: 4.0000 mg | Freq: Four times a day (QID) | INTRAMUSCULAR | Status: DC | PRN

## 2011-04-28 MED ORDER — ASPIRIN EC 81 MG PO TBEC
81.0000 mg | DELAYED_RELEASE_TABLET | Freq: Every day | ORAL | Status: DC
Start: 2011-04-28 — End: 2011-04-29
  Administered 2011-04-29: 81 mg via ORAL
  Filled 2011-04-28 (×2): qty 1

## 2011-04-28 MED ORDER — LORAZEPAM 0.5 MG PO TABS
0.5000 mg | ORAL_TABLET | Freq: Three times a day (TID) | ORAL | Status: DC
  Administered 2011-04-28 – 2011-04-29 (×3): 0.5 mg via ORAL
  Filled 2011-04-28 (×3): qty 1

## 2011-04-28 MED ORDER — FUROSEMIDE 20 MG PO TABS
20.0000 mg | ORAL_TABLET | Freq: Every day | ORAL | Status: DC
  Administered 2011-04-28 – 2011-04-29 (×2): 20 mg via ORAL
  Filled 2011-04-28 (×2): qty 1

## 2011-04-28 MED ORDER — SODIUM CHLORIDE 0.9 % IV SOLN
INTRAVENOUS | Status: DC
  Administered 2011-04-28: 10:00:00 via INTRAVENOUS

## 2011-04-28 MED ORDER — MIDAZOLAM HCL 5 MG/5ML IJ SOLN
INTRAMUSCULAR | Status: AC
  Filled 2011-04-28: qty 5

## 2011-04-28 MED ORDER — CEFAZOLIN SODIUM 1-5 GM-% IV SOLN
INTRAVENOUS | Status: AC
  Filled 2011-04-28: qty 50

## 2011-04-28 MED ORDER — CEFAZOLIN SODIUM 1-5 GM-% IV SOLN
1.0000 g | Freq: Four times a day (QID) | INTRAVENOUS | Status: AC
  Administered 2011-04-28 – 2011-04-29 (×3): 1 g via INTRAVENOUS
  Filled 2011-04-28 (×2): qty 50

## 2011-04-28 MED ORDER — MUPIROCIN 2 % EX OINT
TOPICAL_OINTMENT | Freq: Two times a day (BID) | CUTANEOUS | Status: DC
  Filled 2011-04-28: qty 22

## 2011-04-28 MED ORDER — FENTANYL CITRATE 0.05 MG/ML IJ SOLN
INTRAMUSCULAR | Status: AC
  Filled 2011-04-28: qty 2

## 2011-04-28 MED ORDER — POTASSIUM CHLORIDE CRYS ER 20 MEQ PO TBCR
20.0000 meq | EXTENDED_RELEASE_TABLET | Freq: Every day | ORAL | Status: DC
  Administered 2011-04-28 – 2011-04-29 (×2): 20 meq via ORAL
  Filled 2011-04-28 (×2): qty 1

## 2011-04-28 MED ORDER — LIDOCAINE HCL (PF) 1 % IJ SOLN
INTRAMUSCULAR | Status: AC
  Filled 2011-04-28: qty 60

## 2011-04-28 MED ORDER — ACETAMINOPHEN 325 MG PO TABS: 325.0000 mg | ORAL_TABLET | ORAL | Status: DC | PRN

## 2011-04-28 MED ORDER — MUPIROCIN 2 % EX OINT
1.0000 "application " | TOPICAL_OINTMENT | Freq: Two times a day (BID) | CUTANEOUS | Status: DC
  Administered 2011-04-28 – 2011-04-29 (×2): 1 via NASAL

## 2011-04-28 MED ORDER — MUPIROCIN 2 % EX OINT
TOPICAL_OINTMENT | Freq: Two times a day (BID) | CUTANEOUS | Status: DC
  Administered 2011-04-28: 1 via NASAL

## 2011-04-28 MED ORDER — FAMOTIDINE 10 MG PO TABS
10.0000 mg | ORAL_TABLET | Freq: Every day | ORAL | Status: DC
  Administered 2011-04-28 – 2011-04-29 (×2): 10 mg via ORAL
  Filled 2011-04-28 (×2): qty 1

## 2011-04-28 MED ORDER — MUPIROCIN 2 % EX OINT
TOPICAL_OINTMENT | CUTANEOUS | Status: AC
  Filled 2011-04-28: qty 22

## 2011-04-28 MED ORDER — SODIUM CHLORIDE 0.45 % IV SOLN: INTRAVENOUS | Status: DC

## 2011-04-28 MED ORDER — CEFAZOLIN SODIUM 1-5 GM-% IV SOLN: 1.0000 g | Freq: Four times a day (QID) | INTRAVENOUS | Status: DC

## 2011-04-28 MED ORDER — FENTANYL CITRATE 0.05 MG/ML IJ SOLN: 25.0000 ug | INTRAMUSCULAR | Status: DC | PRN

## 2011-04-28 MED ORDER — HEPARIN (PORCINE) IN NACL 2-0.9 UNIT/ML-% IJ SOLN
INTRAMUSCULAR | Status: AC
  Filled 2011-04-28: qty 1000

## 2011-04-28 MED ORDER — CHLORHEXIDINE GLUCONATE CLOTH 2 % EX PADS
6.0000 | MEDICATED_PAD | Freq: Every day | CUTANEOUS | Status: DC
  Administered 2011-04-29: 6 via TOPICAL

## 2011-04-28 MED ORDER — SOTALOL HCL 120 MG PO TABS
120.0000 mg | ORAL_TABLET | Freq: Two times a day (BID) | ORAL | Status: DC
  Administered 2011-04-28 – 2011-04-29 (×2): 120 mg via ORAL
  Filled 2011-04-28 (×3): qty 1

## 2011-04-28 MED ORDER — PANTOPRAZOLE SODIUM 40 MG PO TBEC
40.0000 mg | DELAYED_RELEASE_TABLET | Freq: Two times a day (BID) | ORAL | Status: DC
  Administered 2011-04-28 – 2011-04-29 (×2): 40 mg via ORAL
  Filled 2011-04-28 (×2): qty 1

## 2011-04-28 MED ORDER — NITROGLYCERIN 0.4 MG SL SUBL: 0.4000 mg | SUBLINGUAL_TABLET | SUBLINGUAL | Status: DC | PRN

## 2011-04-28 NOTE — H&P (View-Only) (Signed)
HPI George Acosta returns today for followup. He is a very pleasant elderly man with a long-standing ischemic cardiomyopathy, chronic systolic heart failure, ventricular tachycardia, status post ICD implantation. He denies chest pain or syncope. He has class II congestive heart failure symptoms. He denies peripheral edema. He has received no recent ICD shocks. Allergies  Allergen Reactions  . Sulfonamide Derivatives      Current Outpatient Prescriptions  Medication Sig Dispense Refill  . aspirin 81 MG tablet Take 81 mg by mouth daily.        . furosemide (LASIX) 20 MG tablet Take 20 mg by mouth daily.       . LORazepam (ATIVAN) 0.5 MG tablet Take 0.5 mg by mouth every 8 (eight) hours. For anxiety. Twice a day & at bedtime      . Multiple Vitamin (MULTI VITAMIN MENS PO) Take by mouth.        . nitroGLYCERIN (NITROSTAT) 0.4 MG SL tablet Place 0.4 mg under the tongue every 5 (five) minutes as needed.        . pantoprazole (PROTONIX) 40 MG tablet Take 40 mg by mouth 2 (two) times daily.       . potassium chloride SA (K-DUR,KLOR-CON) 20 MEQ tablet Take 20 mEq by mouth daily.        . ranitidine (ZANTAC) 300 MG tablet Take 300 mg by mouth at bedtime.        . sotalol (BETAPACE) 120 MG tablet Take 1 tablet (120 mg total) by mouth 2 (two) times daily.  180 tablet  3     Past Medical History  Diagnosis Date  . Congestive heart failure, unspecified   . Coronary atherosclerosis of unspecified type of vessel, native or graft   . MI (myocardial infarction) 1985  . Unspecified essential hypertension   . Benign prostatic hypertrophy   . Acute myocardial infarction, unspecified site, episode of care unspecified   . Peptic ulcer disease   . Arrhythmia     paroxysmal afib  . Diverticulosis   . SOB (shortness of breath)   . Ventricular tachycardia      monomorphic VT  . GERD (gastroesophageal reflux disease)   . Ischemic cardiomyopathy     ROS:   All systems reviewed and negative except as noted  in the HPI.   Past Surgical History  Procedure Date  . Icd 1996    with a generator change in 2001  . Transthoracic echocardiogram 11/2005  . Laparotomy 11/2005    for small bowel obstruction,  . Cardiac catheterization 11/23/2005     Family History  Problem Relation Age of Onset  . Heart disease Mother   . Heart disease Daughter      History   Social History  . Marital Status: Widowed    Spouse Name: N/A    Number of Children: N/A  . Years of Education: N/A   Occupational History  . retired truck driver    Social History Main Topics  . Smoking status: Former Smoker  . Smokeless tobacco: Not on file  . Alcohol Use: Not on file  . Drug Use: Not on file  . Sexually Active: Not on file   Other Topics Concern  . Not on file   Social History Narrative   The patient lives in [place]. He is widowed. 2 boys 2girlsHe is a  retired truck driverPatient is former smoker no tobacco useCaffeine use - 3 cups     BP 112/64  Pulse 59  Ht 6'   1" (1.854 m)  Wt 69.908 kg (154 lb 1.9 oz)  BMI 20.33 kg/m2  Physical Exam:  elderly appearing man,  NAD HEENT: Unremarkable Neck:  No JVD, no thyromegally Lymphatics:  No adenopathy Back:  No CVA tenderness Lungs:  Clear with no wheezes, rales, or rhonchi. Well-healed ICD incision. HEART:  Regular rate rhythm, no murmurs, no rubs, no clicks Abd:  soft, positive bowel sounds, no organomegally, no rebound, no guarding Ext:  2 plus pulses, no edema, no cyanosis, no clubbing Skin:  No rashes no nodules Neuro:  CN II through XII intact, motor grossly intact  DEVICE  Normal device function.  See PaceArt for details. Electrical noise is present on the ICD lead. This results in oversensing.  Assess/Plan:   

## 2011-04-28 NOTE — Op Note (Signed)
ICD lead revision and generator removal/insertion DFT testing without immediate complication. [id number].

## 2011-04-28 NOTE — Interval H&P Note (Signed)
History and Physical Interval Note:  04/28/2011 2:04 PM  George Acosta  has presented today for surgery, with the diagnosis of lead noise  The various methods of treatment have been discussed with the patient and family. After consideration of risks, benefits and other options for treatment, the patient has consented to  Procedure(s): ICD GENERATOR CHANGE LEAD REVISION as a surgical intervention .  The patients' history has been reviewed, patient examined, no change in status, stable for surgery.  I have reviewed the patients' chart and labs.  Questions were answered to the patient's satisfaction.     Lewayne Bunting

## 2011-04-28 NOTE — Progress Notes (Signed)
Pt had vtacch  Episode intermittently  Via cardiac monitor , pt remain awake and alert, denies any discomfort, b/p stable 110/69mmhg  Dr [name] notified, stat ekg done, kept on close supervision

## 2011-04-29 ENCOUNTER — Other Ambulatory Visit: Payer: Self-pay

## 2011-04-29 ENCOUNTER — Ambulatory Visit (HOSPITAL_COMMUNITY): Payer: Medicare HMO

## 2011-04-29 DIAGNOSIS — T82110A Breakdown (mechanical) of cardiac electrode, initial encounter: Secondary | ICD-10-CM

## 2011-04-29 MED ORDER — YOU HAVE A PACEMAKER BOOK
Freq: Once | Status: AC
  Administered 2011-04-29
  Filled 2011-04-29: qty 1

## 2011-04-29 NOTE — Progress Notes (Signed)
Clinical Social Worker received referral for advanced directive request. CSW provided patient with advance directive packet and MOST form to be completed with his physician. CSW answered patient's questions regarding packet and informed patient where forms can get notarized outside of the hospital. CSW will sign off as social work intervention is no longer needed. Please consult [place] again if new needs arise.   Rozetta Nunnery MSW, [employer] [phone]

## 2011-04-29 NOTE — Progress Notes (Signed)
Patient ID: George Acosta, male   DOB: 1929-02-07, 78 y.o.   MRN: [id number] Subjective:  S/p ICD lead revision and insertion of a new device. No chest pain. Minimal incisional pain.  Objective:  Vital Signs in the last 24 hours: Temp:  [95.8 F (35.4 C)-98.7 F (37.1 C)] 96.4 F (35.8 C) (01/17 0744) Pulse Rate:  [50-132] 56  (01/17 0744) Resp:  [15-24] 18  (01/17 0744) BP: (86-146)/(38-83) 87/54 mmHg (01/17 0744) SpO2:  [95 %-100 %] 98 % (01/17 0744) Weight:  [71.215 kg (157 lb)] 71.215 kg (157 lb) (01/16 1001)  Intake/Output from previous day: 01/16 0701 - 01/17 0700 In: 240 [P.O.:240] Out: 1101 [Urine:1100; Stool:1] Intake/Output from this shift: Total I/O In: 240 [P.O.:240] Out: 0   Physical Exam: Well appearing NAD HEENT: Unremarkable Neck:  No JVD, no thyromegally Lungs:  Clear with no wheezes. No hematoma. HEART:  Regular rate rhythm, no murmurs, no rubs, no clicks Abd:  Flat, positive bowel sounds, no organomegally, no rebound, no guarding Ext:  2 plus pulses, no edema, no cyanosis, no clubbing Skin:  No rashes no nodules Neuro:  CN II through XII intact, motor grossly intact  Lab Results: No results found for this basename: WBC:2,HGB:2,PLT:2 in the last 72 hours No results found for this basename: NA:2,K:2,CL:2,CO2:2,GLUCOSE:2,BUN:2,CREATININE:2 in the last 72 hours No results found for this basename: TROPONINI:2,CK,MB:2 in the last 72 hours Hepatic Function Panel No results found for this basename: PROT,ALBUMIN,AST,ALT,ALKPHOS,BILITOT,BILIDIR,IBILI in the last 72 hours No results found for this basename: CHOL in the last 72 hours No results found for this basename: PROTIME in the last 72 hours  Imaging: Dg Chest 2 View  04/29/2011  *RADIOLOGY REPORT*  Clinical Data: Status post pacemaker placement; sore arm  CHEST - 2 VIEW  Comparison: April 28, 2011  Findings: The three pacemaker leads are intact with tips located over the right atrium and right ventricle.   No pneumothorax.  Mild cardiomegaly is unchanged.  The mediastinum and pulmonary vasculature are within normal limits.  There is stable left basilar atelectasis / scarring. There is anterior wedge compression of a lower thoracic / upper lumbar vertebral body, stable.  IMPRESSION: Three pacemaker leads are intact and in good position over the right atrium and right ventricle.  No pneumothorax.  Normal pulmonary vasculature.  Original Report Authenticated By: Brandon Melnick, M.D.   Dg Chest 2 View  04/28/2011  *RADIOLOGY REPORT*  Clinical Data: Patient for cardioverter generator change and left ventricular lead revision.  CHEST - 2 VIEW  Comparison: Single view of the chest 11/22/2005.  Findings: The chest is hyperexpanded compatible with COPD.  AICD is in place with the tip of the proximal lead projecting over the right atrium and distal lead in the apex of the right ventricle. Blunting of the left costophrenic angle is compatible with a small effusion or scar.  No right pleural effusion or pneumothorax identified.  No consolidative process.  IMPRESSION:  1.  Small left effusion versus pleural scar. 2.  Pulmonary hyperexpansion compatible with emphysema.  Original Report Authenticated By: Bernadene Bell. Maricela Curet, M.D.    Cardiac Studies: A. Pacing, NSVT Assessment/Plan:  1. ICD lead dysfunction- s/p ICD lead revision with a new active fixation lead placed and generator change. Stable. Plan discharge this morning. Usual followup.   LOS: 1 day    Lewayne Bunting 04/29/2011, 9:42 AM

## 2011-04-29 NOTE — Progress Notes (Deleted)
Patient ID: George Acosta, male   DOB: 1929-02-07, 78 y.o.   MRN: [id number] Subjective:  Stable overnight. No chest pain. C/o fever and HA  Objective:  Vital Signs in the last 24 hours: Temp:  [95.8 F (35.4 C)-98.7 F (37.1 C)] 96.4 F (35.8 C) (01/17 0744) Pulse Rate:  [50-132] 56  (01/17 0744) Resp:  [15-24] 18  (01/17 0744) BP: (86-146)/(38-83) 87/54 mmHg (01/17 0744) SpO2:  [95 %-100 %] 98 % (01/17 0744) Weight:  [71.215 kg (157 lb)] 71.215 kg (157 lb) (01/16 1001)  Intake/Output from previous day: 01/16 0701 - 01/17 0700 In: 240 [P.O.:240] Out: 1101 [Urine:1100; Stool:1] Intake/Output from this shift: Total I/O In: 240 [P.O.:240] Out: 0   Physical Exam: somnolent appearing NAD HEENT: Unremarkable Neck:  No JVD, no thyromegally Lymphatics:  No adenopathy Back:  No CVA tenderness Lungs:  Clear with no wheezes HEART:  Regular rate rhythm, no murmurs, no rubs, no clicks Abd:  Flat, positive bowel sounds, no organomegally, no rebound, no guarding Ext:  2 plus pulses, no edema, no cyanosis, no clubbing Skin:  No rashes no nodules Neuro:  CN II through XII intact, motor grossly intact  Lab Results: No results found for this basename: WBC:2,HGB:2,PLT:2 in the last 72 hours No results found for this basename: NA:2,K:2,CL:2,CO2:2,GLUCOSE:2,BUN:2,CREATININE:2 in the last 72 hours No results found for this basename: TROPONINI:2,CK,MB:2 in the last 72 hours Hepatic Function Panel No results found for this basename: PROT,ALBUMIN,AST,ALT,ALKPHOS,BILITOT,BILIDIR,IBILI in the last 72 hours No results found for this basename: CHOL in the last 72 hours No results found for this basename: PROTIME in the last 72 hours  Imaging: Dg Chest 2 View  04/29/2011  *RADIOLOGY REPORT*  Clinical Data: Status post pacemaker placement; sore arm  CHEST - 2 VIEW  Comparison: April 28, 2011  Findings: The three pacemaker leads are intact with tips located over the right atrium and right  ventricle.  No pneumothorax.  Mild cardiomegaly is unchanged.  The mediastinum and pulmonary vasculature are within normal limits.  There is stable left basilar atelectasis / scarring. There is anterior wedge compression of a lower thoracic / upper lumbar vertebral body, stable.  IMPRESSION: Three pacemaker leads are intact and in good position over the right atrium and right ventricle.  No pneumothorax.  Normal pulmonary vasculature.  Original Report Authenticated By: Brandon Melnick, M.D.   Dg Chest 2 View  04/28/2011  *RADIOLOGY REPORT*  Clinical Data: Patient for cardioverter generator change and left ventricular lead revision.  CHEST - 2 VIEW  Comparison: Single view of the chest 11/22/2005.  Findings: The chest is hyperexpanded compatible with COPD.  AICD is in place with the tip of the proximal lead projecting over the right atrium and distal lead in the apex of the right ventricle. Blunting of the left costophrenic angle is compatible with a small effusion or scar.  No right pleural effusion or pneumothorax identified.  No consolidative process.  IMPRESSION:  1.  Small left effusion versus pleural scar. 2.  Pulmonary hyperexpansion compatible with emphysema.  Original Report Authenticated By: Bernadene Bell. Maricela Curet, M.D.    Cardiac Studies: Tele - nsr Assessment/Plan:  1. Chest pain - his pain is resolved and cardiac markers are normal. No additional ischemic workup at this time. 2. Low grade fever/chills/headache - symptoms are concerning for influenza. Will check Influenza PCR   LOS: 1 day    Lewayne Bunting 04/29/2011, 9:35 AM

## 2011-04-29 NOTE — Op Note (Signed)
NAMEFRANCHOT, Acosta NO.:  [id number]  MEDICAL RECORD NO.:  [id number]  LOCATION:  2507                         FACILITY:  [facility]  PHYSICIAN:  George Canning. [name], George Acosta    DATE OF BIRTH:  18-May-1928  DATE OF PROCEDURE:  04/28/2011 DATE OF DISCHARGE:                              OPERATIVE REPORT   PROCEDURE PERFORMED:  Insertion of a new implantable cardioverter- defibrillator lead and removal of previously implanted implantable cardioverter-defibrillator which was approaching elective replacement and insertion of a new implantable cardioverter-defibrillator system, all by way of a subpectoral approach with left upper extremity contrast venography utilized along with defibrillation threshold testing.  INTRODUCTION:  The patient is an 78 year old man with a longstanding ischemic cardiomyopathy, chronic class 2 congestive heart failure, and ventricular tachycardia.  He has subsequently been found to have no waves on his ventricular defibrillator lead and he is approaching elective replacement with less than the year of his device remaining. He is now referred for lead revision and insertion of a new device.  PROCEDURE:  After informed consent was obtained, the patient was taken to the diagnostic EP lab in a fasting state.  Prior to the procedure being started, left upper extremity contrast venography was carried out to be certain that his subclavian vein was patent with his prior device implantation.  This was demonstrated to be the case and with this, 30 mL of lidocaine was infiltrated into the left infraclavicular region.  A 7- cm incision was carried out over this region, and electrocautery was utilized to dissect down to the pectoralis fascia.  The device was placed subpectorally.  The pectoralis major was dissected free and the pocket was entered and the generator was removed with gentle traction. At this point, the left subclavian vein was punctured and a new  [employer] Gore-Tex active fixation defibrillation lead, serial [id number] was advanced by way of the left subclavian vein into the right ventricle.  Mapping was carried out in the final site, the R-waves were greater than 10 mV and the pacing impedance was 600 ohms.  Threshold was less than a volt at 0.5 msec and 10-volt pacing did not stimulate the diaphragm.  With active fixation of the lead, there was a large injury current present.  With these satisfactory parameters, the lead was secured to the subpectoralis fascia with a figure-of-eight silk suture and the sewing sleeve was secured with silk suture.  At this point, the old defibrillator lead was capped.  The capped lead was placed back in the subpectoral pocket.  The old [employer] device was removed from the pocket and the [employer] ICD was connected to the need to the new defibrillation lead and the old atrial lead.  The new device serial number was [id number].  Having accomplished this, the device was placed back in the subpectoral pocket and the pectoralis major was reapproximated with a 2-0 Vicryl suture. Subcutaneous tissue was reapproximated with 2-0 Vicryl suture and 3-0 Vicryl suture was utilized to sew up the skin.  Having accomplished this, benzoin Steri-Strips were placed on the incision and pressure dressing held.  At this point, I scrubbed out the case  to assist with defibrillation threshold testing.  After the patient was more deeply sedated with intravenous fentanyl and Versed, VF/VT was induced with a T-wave induction shock.  A 17-joule shock was subsequently delivered, which terminated ventricular tachycardia/fibrillation and restored normal sinus rhythm.  At this point, no additional defibrillation threshold testing was carried out, and the patient was sent to the recovery room in satisfactory condition.  COMPLICATIONS:  There were no immediate procedure  complications.  RESULTS:  This demonstrates successful removal of a previously implanted dual-chamber ICD which was approaching elective replacement, successful insertion of a new active fixation single coil Gore-Tex defibrillation lead, and successful defibrillation threshold testing utilizing contrast venography.     George Canning. [name], George Acosta     GWT/MEDQ  D:  04/28/2011  T:  04/29/2011  Job:  [id number]

## 2011-04-29 NOTE — Discharge Summary (Signed)
Discharge Summary   Patient ID: George Acosta MRN: [id number], DOB/AGE: 08-21-1928 78 y.o.  Primary MD: Ebbie Ridge, MD Primary Cardiologist: Lewayne Bunting MD  Admit date: 04/28/2011 D/C date:     04/29/2011      Primary Discharge Diagnoses:  1. Ischemic cardiomyopathy with chronic systolic heart failure  - H/o VT with initial ICD placement in 1996 w/ generator change in 2001  - Lead dysfunction s/p ICD lead revision with a new active fixation lead placed and generator change on 04/28/11  Secondary Discharge Diagnoses:  1. CAD 2. HTN 3. Paroxysmal Atrial Fibrillation 4. GERD 5. PUD 6. Diverticulosis 7. BPH  Allergies Allergen  . Sulfonamide Derivatives   Diagnostic Studies/Procedures:  04/28/11 - Insertion of a new implantable cardioverter-defibrillator lead and removal of previously implanted implantable cardioverter-defibrillator which was approaching elective replacement and insertion of a new implantable cardioverter-defibrillator system. - A new [employer] Gore-Tex active fixation defibrillation lead, serial [id number] was placed - A new [employer] ICD, serial # [id number], was connected to the need to the new defibrillation lead and  the old atrial lead  Dg Chest 2 View 04/29/2011 Findings: The three pacemaker leads are intact with tips located over the right atrium and right ventricle.  No pneumothorax.  Mild cardiomegaly is unchanged.  The mediastinum and pulmonary vasculature are within normal limits.  There is stable left basilar atelectasis / scarring. There is anterior wedge compression of a lower thoracic / upper lumbar vertebral body, stable.  IMPRESSION: Three pacemaker leads are intact and in good position over the right atrium and right ventricle.  No pneumothorax.  Normal pulmonary vasculature.    History of Present Illness: 78 y.o. male w/ PMHx significant for Chronic Systolic CHF 2/2 Ischemic CM and Ventricular  tachycardia s/p ICD ('96), CAD, HTN, and Paroxysmal A. Fib who presented to [facility] on 04/28/11 for planned ICD lead revision and generator change.  Transmission of his ICD showed oversensing of noise on his v-lead in September 2012. He was seen in the office by Dr. [name] at that time and denied any ICD shocks. His device was reprogrammed to prevent ICD shocks due to noise and was to return in 3mos. He was again seen in the office on 04/21/11 and denied ICD shocks. He was noted to have evidence of ICD lead malfunction with ventricular oversensing of electrical noise and it was recommended that he undergo ICD lead revision and placement of a new ICD.   Hospital Course: [name] presented to [facility] on 04/28/11 for planned ICD lead revision and generator change. A new Marketing executive active fixation defibrillation lead was placed as was a [employer] ICD. He tolerated the procedure well without complication. His post op CXR was without pneumothorax. The afternoon following the procedure he was noted to have multiple episodes of NSVT on telemetry but didn't receive a shock, remained asymptomatic and in stable condition. Dr. [name] was notified and the patient was monitored closely.  Today he was seen and examined by Dr. [name] and found stable for discharge home with follow up as scheduled below.  Discharge Vitals: Blood pressure 87/54, pulse 56, temperature 96.4 F (35.8 C), temperature source Oral, resp. rate 18, height 5' 5.5" (1.664 m), weight 157 lb (71.215 kg), SpO2 98.00%.  Labs: None  Discharge Medications   Current Discharge Medication List    CONTINUE these medications which have NOT CHANGED   Details  aspirin 81 MG tablet  Take 81 mg by mouth daily.      furosemide (LASIX) 20 MG tablet Take 20 mg by mouth daily.     LORazepam (ATIVAN) 0.5 MG tablet Take 0.5 mg by mouth every 8 (eight) hours. For anxiety. Twice a day & at bedtime      Multiple Vitamin (MULTI VITAMIN MENS PO) Take by mouth.      pantoprazole (PROTONIX) 40 MG tablet Take 40 mg by mouth 2 (two) times daily.     potassium chloride SA (K-DUR,KLOR-CON) 20 MEQ tablet Take 20 mEq by mouth daily.      ranitidine (ZANTAC) 300 MG tablet Take 300 mg by mouth at bedtime.      sotalol (BETAPACE) 120 MG tablet Take 1 tablet (120 mg total) by mouth 2 (two) times daily. Qty: 180 tablet, Refills: 3    nitroGLYCERIN (NITROSTAT) 0.4 MG SL tablet Place 0.4 mg under the tongue every 5 (five) minutes as needed.         Disposition   Discharge Orders    Future Appointments: Provider: Department: Dept Phone: Center:   05/06/2011 12:00 PM Vella Kohler [facility] [phone] [facility]     Future Orders Please Complete By Expires   Diet - low sodium heart healthy      Increase activity slowly        Follow-up Information    Follow up with [facility] on 05/06/2011. (Device Clinic/Wound Check appointment @ 12:00p.)    Contact information:   [address] [place] [address] [phone]       Follow up with Lewayne Bunting, MD. (Our office will call you with your follow up appointment)    Contact information:   [facility] [address] [place] [address] [phone]           Outstanding Labs/Studies: None  Duration of Discharge Encounter: Greater than 30 minutes including physician and PA time.  Signed, Niclas Markell PA-C 04/29/2011, 10:57 AM

## 2011-04-30 ENCOUNTER — Other Ambulatory Visit: Payer: Self-pay

## 2011-04-30 ENCOUNTER — Encounter (HOSPITAL_COMMUNITY): Payer: Self-pay

## 2011-04-30 ENCOUNTER — Emergency Department (HOSPITAL_COMMUNITY): Payer: Medicare HMO

## 2011-04-30 ENCOUNTER — Inpatient Hospital Stay (HOSPITAL_COMMUNITY)
Admission: EM | Admit: 2011-04-30 | Discharge: 2011-05-02 | DRG: 309 | Disposition: A | Discharge status: Home / Self Care | Payer: Medicare HMO | Attending: Internal Medicine | Admitting: Internal Medicine

## 2011-04-30 DIAGNOSIS — I4891 Unspecified atrial fibrillation: Secondary | ICD-10-CM | POA: Diagnosis present

## 2011-04-30 DIAGNOSIS — I472 Ventricular tachycardia, unspecified: Principal | ICD-10-CM | POA: Diagnosis present

## 2011-04-30 DIAGNOSIS — K219 Gastro-esophageal reflux disease without esophagitis: Secondary | ICD-10-CM | POA: Diagnosis present

## 2011-04-30 DIAGNOSIS — I255 Ischemic cardiomyopathy: Secondary | ICD-10-CM

## 2011-04-30 DIAGNOSIS — I252 Old myocardial infarction: Secondary | ICD-10-CM

## 2011-04-30 DIAGNOSIS — I10 Essential (primary) hypertension: Secondary | ICD-10-CM | POA: Diagnosis present

## 2011-04-30 DIAGNOSIS — Z7982 Long term (current) use of aspirin: Secondary | ICD-10-CM

## 2011-04-30 DIAGNOSIS — I509 Heart failure, unspecified: Secondary | ICD-10-CM | POA: Diagnosis present

## 2011-04-30 DIAGNOSIS — F411 Generalized anxiety disorder: Secondary | ICD-10-CM | POA: Diagnosis present

## 2011-04-30 DIAGNOSIS — Z79899 Other long term (current) drug therapy: Secondary | ICD-10-CM

## 2011-04-30 DIAGNOSIS — I2510 Atherosclerotic heart disease of native coronary artery without angina pectoris: Secondary | ICD-10-CM | POA: Diagnosis present

## 2011-04-30 DIAGNOSIS — K5730 Diverticulosis of large intestine without perforation or abscess without bleeding: Secondary | ICD-10-CM | POA: Diagnosis present

## 2011-04-30 DIAGNOSIS — Z882 Allergy status to sulfonamides status: Secondary | ICD-10-CM

## 2011-04-30 DIAGNOSIS — Z95810 Presence of automatic (implantable) cardiac defibrillator: Secondary | ICD-10-CM

## 2011-04-30 DIAGNOSIS — I4729 Other ventricular tachycardia: Principal | ICD-10-CM | POA: Diagnosis present

## 2011-04-30 DIAGNOSIS — Z8249 Family history of ischemic heart disease and other diseases of the circulatory system: Secondary | ICD-10-CM

## 2011-04-30 DIAGNOSIS — I119 Hypertensive heart disease without heart failure: Secondary | ICD-10-CM | POA: Insufficient documentation

## 2011-04-30 DIAGNOSIS — I480 Paroxysmal atrial fibrillation: Secondary | ICD-10-CM | POA: Insufficient documentation

## 2011-04-30 DIAGNOSIS — I2589 Other forms of chronic ischemic heart disease: Secondary | ICD-10-CM | POA: Diagnosis present

## 2011-04-30 DIAGNOSIS — I5022 Chronic systolic (congestive) heart failure: Secondary | ICD-10-CM | POA: Diagnosis present

## 2011-04-30 DIAGNOSIS — Z87891 Personal history of nicotine dependence: Secondary | ICD-10-CM

## 2011-04-30 HISTORY — DX: Paroxysmal atrial fibrillation: I48.0

## 2011-04-30 LAB — URINALYSIS, ROUTINE W REFLEX MICROSCOPIC
Bilirubin Urine: NEGATIVE
Glucose, UA: NEGATIVE mg/dL
Ketones, ur: NEGATIVE mg/dL
Leukocytes, UA: NEGATIVE
Nitrite: NEGATIVE
Protein, ur: NEGATIVE mg/dL
pH: 5 (ref 5.0–8.0)

## 2011-04-30 LAB — COMPREHENSIVE METABOLIC PANEL
AST: 14 U/L (ref 0–37)
CO2: 27 mEq/L (ref 19–32)
Calcium: 8.9 mg/dL (ref 8.4–10.5)
Chloride: 102 mEq/L (ref 96–112)
Creatinine, Ser: 1 mg/dL (ref 0.50–1.35)
GFR calc Af Amer: 80 mL/min — ABNORMAL LOW (ref >90)
GFR calc non Af Amer: 69 mL/min — ABNORMAL LOW (ref >90)
Glucose, Bld: 93 mg/dL (ref 70–99)
Total Bilirubin: 0.7 mg/dL (ref 0.3–1.2)

## 2011-04-30 LAB — CBC
HCT: 38.1 % — ABNORMAL LOW (ref 39.0–52.0)
HCT: 35 % — ABNORMAL LOW (ref 39.0–52.0)
Hemoglobin: 13 g/dL (ref 13.0–17.0)
Hemoglobin: 11.7 g/dL — ABNORMAL LOW (ref 13.0–17.0)
MCV: 91.8 fL (ref 78.0–100.0)
RBC: 4.15 MIL/uL — ABNORMAL LOW (ref 4.22–5.81)
WBC: 5.7 10*3/uL (ref 4.0–10.5)
WBC: 5.1 10*3/uL (ref 4.0–10.5)

## 2011-04-30 LAB — CARDIAC PANEL(CRET KIN+CKTOT+MB+TROPI)
Relative Index: INVALID (ref 0.0–2.5)
Troponin I: <0.3 ng/mL (ref <0.30)

## 2011-04-30 LAB — PROTIME-INR: INR: 1.11 (ref 0.00–1.49)

## 2011-04-30 LAB — CREATININE, SERUM
GFR calc Af Amer: >90 mL/min (ref >90)
GFR calc non Af Amer: 79 mL/min — ABNORMAL LOW (ref >90)

## 2011-04-30 LAB — DIFFERENTIAL
Eosinophils Relative: 1 % (ref 0–5)
Lymphocytes Relative: 17 % (ref 12–46)
Lymphs Abs: 1 10*3/uL (ref 0.7–4.0)
Monocytes Absolute: 0.7 10*3/uL (ref 0.1–1.0)
Monocytes Relative: 13 % — ABNORMAL HIGH (ref 3–12)
Neutro Abs: 3.9 10*3/uL (ref 1.7–7.7)

## 2011-04-30 LAB — GLUCOSE, CAPILLARY

## 2011-04-30 LAB — TSH: TSH: 1.563 u[IU]/mL (ref 0.350–4.500)

## 2011-04-30 LAB — MAGNESIUM: Magnesium: 1.9 mg/dL (ref 1.5–2.5)

## 2011-04-30 LAB — MRSA PCR SCREENING: MRSA by PCR: POSITIVE — AB

## 2011-04-30 MED ORDER — ADULT MULTIVITAMIN W/MINERALS CH
1.0000 | ORAL_TABLET | Freq: Every day | ORAL | Status: DC
  Administered 2011-05-01 – 2011-05-02 (×2): 1 via ORAL
  Filled 2011-04-30 (×3): qty 1

## 2011-04-30 MED ORDER — PANTOPRAZOLE SODIUM 40 MG PO TBEC
40.0000 mg | DELAYED_RELEASE_TABLET | Freq: Two times a day (BID) | ORAL | Status: DC
  Administered 2011-04-30 – 2011-05-02 (×4): 40 mg via ORAL
  Filled 2011-04-30 (×4): qty 1

## 2011-04-30 MED ORDER — FUROSEMIDE 20 MG PO TABS
20.0000 mg | ORAL_TABLET | Freq: Every day | ORAL | Status: DC
  Filled 2011-04-30: qty 1

## 2011-04-30 MED ORDER — FAMOTIDINE 20 MG PO TABS
20.0000 mg | ORAL_TABLET | Freq: Every day | ORAL | Status: DC
  Administered 2011-05-01 – 2011-05-02 (×2): 20 mg via ORAL
  Filled 2011-04-30 (×2): qty 1

## 2011-04-30 MED ORDER — LORAZEPAM 0.5 MG PO TABS
0.5000 mg | ORAL_TABLET | Freq: Three times a day (TID) | ORAL | Status: DC
  Administered 2011-04-30 – 2011-05-02 (×5): 0.5 mg via ORAL
  Filled 2011-04-30 (×5): qty 1

## 2011-04-30 MED ORDER — POTASSIUM CHLORIDE CRYS ER 20 MEQ PO TBCR
20.0000 meq | EXTENDED_RELEASE_TABLET | Freq: Every day | ORAL | Status: DC
  Administered 2011-05-01: 20 meq via ORAL
  Filled 2011-04-30: qty 1

## 2011-04-30 MED ORDER — ALPRAZOLAM 0.25 MG PO TABS: 0.2500 mg | ORAL_TABLET | Freq: Two times a day (BID) | ORAL | Status: DC | PRN

## 2011-04-30 MED ORDER — SOTALOL HCL 80 MG PO TABS
160.0000 mg | ORAL_TABLET | Freq: Two times a day (BID) | ORAL | Status: DC
  Filled 2011-04-30: qty 2

## 2011-04-30 MED ORDER — NITROGLYCERIN 0.4 MG SL SUBL: 0.4000 mg | SUBLINGUAL_TABLET | SUBLINGUAL | Status: DC | PRN

## 2011-04-30 MED ORDER — CHLORHEXIDINE GLUCONATE CLOTH 2 % EX PADS
6.0000 | MEDICATED_PAD | Freq: Every day | CUTANEOUS | Status: DC
  Administered 2011-05-01 – 2011-05-02 (×2): 6 via TOPICAL

## 2011-04-30 MED ORDER — SODIUM CHLORIDE 0.9 % IV SOLN: 250.0000 mL | INTRAVENOUS | Status: DC | PRN

## 2011-04-30 MED ORDER — MULTI VITAMIN MENS PO TABS: 1.0000 | ORAL_TABLET | Freq: Every day | ORAL | Status: DC

## 2011-04-30 MED ORDER — POTASSIUM CHLORIDE CRYS ER 20 MEQ PO TBCR: 20.0000 meq | EXTENDED_RELEASE_TABLET | Freq: Every day | ORAL | Status: DC

## 2011-04-30 MED ORDER — SOTALOL HCL 80 MG PO TABS
160.0000 mg | ORAL_TABLET | Freq: Two times a day (BID) | ORAL | Status: DC
  Administered 2011-04-30 – 2011-05-02 (×4): 160 mg via ORAL
  Filled 2011-04-30 (×5): qty 2

## 2011-04-30 MED ORDER — FUROSEMIDE 20 MG PO TABS
20.0000 mg | ORAL_TABLET | Freq: Every day | ORAL | Status: DC
  Administered 2011-05-01 – 2011-05-02 (×2): 20 mg via ORAL
  Filled 2011-04-30 (×2): qty 1

## 2011-04-30 MED ORDER — ACETAMINOPHEN 325 MG PO TABS: 650.0000 mg | ORAL_TABLET | ORAL | Status: DC | PRN

## 2011-04-30 MED ORDER — SODIUM CHLORIDE 0.9 % IV BOLUS (SEPSIS)
500.0000 mL | Freq: Once | INTRAVENOUS | Status: DC
  Administered 2011-04-30: 500 mL via INTRAVENOUS

## 2011-04-30 MED ORDER — SODIUM CHLORIDE 0.9 % IV BOLUS (SEPSIS)
250.0000 mL | Freq: Once | INTRAVENOUS | Status: AC
  Administered 2011-04-30: 250 mL via INTRAVENOUS

## 2011-04-30 MED ORDER — MUPIROCIN 2 % EX OINT
1.0000 "application " | TOPICAL_OINTMENT | Freq: Two times a day (BID) | CUTANEOUS | Status: DC
  Administered 2011-04-30 – 2011-05-02 (×4): 1 via NASAL
  Filled 2011-04-30: qty 22

## 2011-04-30 MED ORDER — FAMOTIDINE 20 MG PO TABS
20.0000 mg | ORAL_TABLET | Freq: Every day | ORAL | Status: DC
  Filled 2011-04-30: qty 1

## 2011-04-30 MED ORDER — SODIUM CHLORIDE 0.9 % IJ SOLN: 3.0000 mL | INTRAMUSCULAR | Status: DC | PRN

## 2011-04-30 MED ORDER — HEPARIN SODIUM (PORCINE) 5000 UNIT/ML IJ SOLN
5000.0000 [IU] | Freq: Three times a day (TID) | INTRAMUSCULAR | Status: DC
  Administered 2011-04-30 – 2011-05-02 (×5): 5000 [IU] via SUBCUTANEOUS
  Filled 2011-04-30 (×8): qty 1

## 2011-04-30 MED ORDER — SODIUM CHLORIDE 0.9 % IJ SOLN
3.0000 mL | Freq: Two times a day (BID) | INTRAMUSCULAR | Status: DC
  Administered 2011-04-30 – 2011-05-02 (×3): 3 mL via INTRAVENOUS

## 2011-04-30 MED ORDER — ONDANSETRON HCL 4 MG/2ML IJ SOLN: 4.0000 mg | Freq: Four times a day (QID) | INTRAMUSCULAR | Status: DC | PRN

## 2011-04-30 MED ORDER — ZOLPIDEM TARTRATE 5 MG PO TABS: 5.0000 mg | ORAL_TABLET | Freq: Every evening | ORAL | Status: DC | PRN

## 2011-04-30 MED ORDER — ASPIRIN EC 81 MG PO TBEC
81.0000 mg | DELAYED_RELEASE_TABLET | Freq: Every day | ORAL | Status: DC
  Administered 2011-05-01 – 2011-05-02 (×2): 81 mg via ORAL
  Filled 2011-04-30 (×2): qty 1

## 2011-04-30 MED ORDER — ASPIRIN EC 81 MG PO TBEC
81.0000 mg | DELAYED_RELEASE_TABLET | Freq: Every day | ORAL | Status: DC
  Filled 2011-04-30: qty 1

## 2011-04-30 NOTE — H&P (Signed)
Patient ID: George Acosta MRN: [id number], DOB/AGE: 15-Feb-1929   Admit date: 04/30/2011   Primary Physician: Ebbie Ridge, MD, MD Primary Cardiologist: G. Taylor  Pt. Profile:   78 y/o male with h/o cad and icm s/p icd change-out and lead revision yesterday who presents 2/2 VT req defib in field.  Problem List: Past Medical History  Diagnosis Date  . Coronary atherosclerosis of unspecified type of vessel, native or graft   . Unspecified essential hypertension   . Benign prostatic hypertrophy   . Peptic ulcer disease   . Diverticulosis   . SOB (shortness of breath)   . GERD (gastroesophageal reflux disease)   . Ischemic cardiomyopathy   . Congestive heart failure, unspecified   . Arrhythmia     paroxysmal afib  . Ventricular tachycardia     A.   monomorphic VT  B. 04/2011 s/p generator change - [employer] ICD  [phone]  . MI (myocardial infarction) 1985    "big MI after I got to hospital"  . Acute myocardial infarction, unspecified site, episode of care unspecified 1985    "on way to hospital"  . Pneumonia 1980's  . Anxiety   . Pancreatitis 1980's  . Paroxysmal atrial fibrillation     Past Surgical History  Procedure Date  . Icd 2001; 2007; 04/28/11    generator change; complete replacement; lead replacement  . Transthoracic echocardiogram 11/2005  . Laparotomy 11/2005    for small bowel obstruction,  . Cardiac catheterization 11/23/2005  . Cardiac defibrillator placement 1996  . Cholecystectomy 1980's  . Cataract extraction 2012    left eye     Allergies:  Allergies  Allergen Reactions  . Sulfonamide Derivatives     HPI:   78 y/o male with the above problem list.  He was admitted 1/16 for elective generator change-out and lead revision 2/2 excessive noise on the lead.  He was discharged yesterday in good condition.  His peri-op course was uncomplicated.  This afternoon, pt was sitting in the recliner, dozing off, when he sat up suddenly  and told his son that he was having bilat arm pain and dyspnea.  He also noted a "numbness" across his chest.  Ss were similar to prior MI Ss.  His son called EMS and upon their arrival, he was found to be in a WCT in the 140's.  Pt denied tachypalps or presyncope, and had not experienced any syncope.  He was taken into the ambulance and en route, decision was made to cardiovert the pt 2/2 wct.  Pt was treated with versed 2mg  and then defib w/ 200J at which point he resumed an atrial paced rhythm.    Upon arrival to [facility], pt was w/o complaints.  His BP has been trending in the 70's - 80's and he was given a fluid bolus.  It is noted that his bp's typically trend in the 80's to 90's.  Device interrogation has been performed showing VT, which was treated with ATP.  Though VT did not initially break following ATP, it did break prior to req a shock. Interrogation showed additional VT and ATP as well as nsvt w/o atp.  Pt is currently w/o complaints - chest/arm pain and dyspnea have resolved.   Home Medications  Medications Prior to Admission  Medication Sig Dispense Refill  . aspirin 81 MG tablet Take 81 mg by mouth daily.        . furosemide (LASIX) 20 MG tablet Take 20 mg by mouth  daily.       Marland Kitchen LORazepam (ATIVAN) 0.5 MG tablet Take 0.5 mg by mouth every 8 (eight) hours. For anxiety. Twice a day & at bedtime      . Multiple Vitamin (MULTI VITAMIN MENS PO) Take by mouth.        . nitroGLYCERIN (NITROSTAT) 0.4 MG SL tablet Place 0.4 mg under the tongue every 5 (five) minutes as needed.        . pantoprazole (PROTONIX) 40 MG tablet Take 40 mg by mouth 2 (two) times daily.       . potassium chloride SA (K-DUR,KLOR-CON) 20 MEQ tablet Take 20 mEq by mouth daily.        . ranitidine (ZANTAC) 300 MG tablet Take 300 mg by mouth at bedtime.        . sotalol (BETAPACE) 120 MG tablet Take 1 tablet (120 mg total) by mouth 2 (two) times daily.  180 tablet  3     Family History  Problem Relation Age of Onset  .  Heart disease Mother   . Heart disease Daughter     History   Social History  . Marital Status: Widowed    Spouse Name: N/A    Number of Children: N/A  . Years of Education: N/A   Occupational History  . retired Naval architect    Social History Main Topics  . Smoking status: Former Smoker -- 2.0 packs/day for 40 years    Types: Cigarettes  . Smokeless tobacco: Former Neurosurgeon    Types: Chew, Snuff   Comment: quit smoking cigarettes 1985; quit smokeless tobacco ~ 2011  . Alcohol Use: No  . Drug Use: No  . Sexually Active: No   Other Topics Concern  . Not on file   Social History Narrative   The patient lives in [place]. He is widowed. 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  Cardiovascular:+++ chest pain/arm pain and dyspnea.  No edema, orthopnea, palpitations, paroxysmal nocturnal dyspnea. Dermatological: negative for rash Respiratory: negative for cough or wheezing Urologic: negative for hematuria Abdominal: negative for nausea, vomiting, diarrhea, bright red blood per rectum, melena, or hematemesis. Neurologic: negative for visual changes, syncope, or dizziness All other systems reviewed and are otherwise negative except as noted above.  Physical Exam: Blood pressure 86/54, pulse 64, temperature 99.6 F (37.6 C), temperature source Rectal, resp. rate 20, SpO2 100.00%.  General: pleasant.  No acute distress. Head: Normocephalic, atraumatic, sclera non-icteric, no xanthomas, nares are without discharge.  Neck: Supple without JVD. Lungs:  Resp regular and unlabored with bibasilar crackles. Heart: distant - RRR no s3, s4, or murmurs. Abdomen: Soft, non-tender, non-distended, BS + x 4.   Left Chest wall icd site is ecchymotic w/o bleeding/hematoma. Msk:  Strength and tone appears normal for age. Extremities: No clubbing,  cyanosis or edema. DP/PT/Radials 1+ and equal bilaterally. Neuro: Alert and oriented X 3. Moves all extremities spontaneously. Psych: Normal affect.   Labs:   Results for orders placed during the hospital encounter of 04/30/11 (from the past 72 hour(s))  CBC     Status: Abnormal   Collection Time   04/30/11  1:10 PM      Component Value Range Comment   WBC 5.7  4.0 - 10.5 (K/uL)    RBC 4.15 (*) 4.22 - 5.81 (MIL/uL)  Hemoglobin 13.0  13.0 - 17.0 (g/dL)    HCT 16.1 (*) 09.6 - 52.0 (%)    MCV 91.8  78.0 - 100.0 (fL)    MCH 31.3  26.0 - 34.0 (pg)    MCHC 34.1  30.0 - 36.0 (g/dL)    RDW 04.5  40.9 - 81.1 (%)    Platelets 156  150 - 400 (K/uL)   DIFFERENTIAL     Status: Abnormal   Collection Time   04/30/11  1:10 PM      Component Value Range Comment   Neutrophils Relative 69  43 - 77 (%)    Neutro Abs 3.9  1.7 - 7.7 (K/uL)    Lymphocytes Relative 17  12 - 46 (%)    Lymphs Abs 1.0  0.7 - 4.0 (K/uL)    Monocytes Relative 13 (*) 3 - 12 (%)    Monocytes Absolute 0.7  0.1 - 1.0 (K/uL)    Eosinophils Relative 1  0 - 5 (%)    Eosinophils Absolute 0.0  0.0 - 0.7 (K/uL)    Basophils Relative 1  0 - 1 (%)    Basophils Absolute 0.0  0.0 - 0.1 (K/uL)   COMPREHENSIVE METABOLIC PANEL     Status: Abnormal   Collection Time   04/30/11  1:10 PM      Component Value Range Comment   Sodium 139  135 - 145 (mEq/L)    Potassium 3.9  3.5 - 5.1 (mEq/L)    Chloride 102  96 - 112 (mEq/L)    CO2 27  19 - 32 (mEq/L)    Glucose, Bld 93  70 - 99 (mg/dL)    BUN 15  6 - 23 (mg/dL)    Creatinine, Ser 9.14  0.50 - 1.35 (mg/dL)    Calcium 8.9  8.4 - 10.5 (mg/dL)    Total Protein 6.8  6.0 - 8.3 (g/dL)    Albumin 3.5  3.5 - 5.2 (g/dL)    AST 14  0 - 37 (U/L)    ALT 6  0 - 53 (U/L)    Alkaline Phosphatase 85  39 - 117 (U/L)    Total Bilirubin 0.7  0.3 - 1.2 (mg/dL)    GFR calc non Af Amer 69 (*) >90 (mL/min)    GFR calc Af Amer 80 (*) >90 (mL/min)   PROTIME-INR     Status: Normal   Collection Time    04/30/11  1:10 PM      Component Value Range Comment   Prothrombin Time 14.5  11.6 - 15.2 (seconds)    INR 1.11  0.00 - 1.49    GLUCOSE, CAPILLARY     Status: Normal   Collection Time   04/30/11  1:13 PM      Component Value Range Comment   Glucose-Capillary 93  70 - 99 (mg/dL)    Comment 1 Documented in Chart      Comment 2 Notify RN     CARDIAC PANEL(CRET KIN+CKTOT+MB+TROPI)     Status: Normal   Collection Time   04/30/11  1:15 PM      Component Value Range Comment   Total CK 46  7 - 232 (U/L)    CK, MB 1.8  0.3 - 4.0 (ng/mL)    Troponin I <0.30  <0.30 (ng/mL)    Relative Index RELATIVE INDEX IS INVALID  0.0 - 2.5    URINALYSIS, ROUTINE W REFLEX MICROSCOPIC     Status: Normal   Collection Time  04/30/11  2:15 PM      Component Value Range Comment   Color, Urine YELLOW  YELLOW     APPearance CLEAR  CLEAR     Specific Gravity, Urine 1.009  1.005 - 1.030     pH 5.0  5.0 - 8.0     Glucose, UA NEGATIVE  NEGATIVE (mg/dL)    Hgb urine dipstick NEGATIVE  NEGATIVE     Bilirubin Urine NEGATIVE  NEGATIVE     Ketones, ur NEGATIVE  NEGATIVE (mg/dL)    Protein, ur NEGATIVE  NEGATIVE (mg/dL)    Urobilinogen, UA 0.2  0.0 - 1.0 (mg/dL)    Nitrite NEGATIVE  NEGATIVE     Leukocytes, UA NEGATIVE  NEGATIVE  MICROSCOPIC NOT DONE ON URINES WITH NEGATIVE PROTEIN, BLOOD, LEUKOCYTES, NITRITE, OR GLUCOSE <1000 mg/dL.     Radiology/Studies:   EKG:  A paced, 75, old inf infarct, lateral st dep/twi (v5-6) - old.  No acute changes.  ASSESSMENT AND PLAN:   1.  VT:  Pt with Vt and NSVT.  He was defib in field.  He did have chest pain with VT but no syncope.  Device interrogated and strips being reviewed by Dr. [name].  Lytes WNL.  Will plan to admit and cycle CE.  Cont home meds including sotalol (? Increase dose).  Pts wife says he was on amiodarone for 11 yrs spanning the mid 33's to mid 90's but suffered many side effects.  2.  ICM/Chronic Syst CHF:  Volume looks good - neck veins flat.  He does have  crackles on exam.  Will check cxr.  Last EF was 50% in 2007.  3.  CAD:  As above, pt had c/p in the setting of arrhythmia.  This has resolved.  Cycle CE.  Cont asa.  Check lipids (not on statin).   Signed, Nicolasa Ducking, NP 04/30/2011, 2:57 PM Cardiology Attending Patient seen and examined. I agree with the history, exam and plan as noted by Mr. Brion Aliment above. He has had recurrent VT after ICD generator change and lead revision. His device is working normally and has been reprogrammed to a lower detection rate of 140/min. ATP has been added. I would at this point increase his Sotalol to 160 bid, follow his QT duration and expect to discharge in 48 hours. If QT prolongs by more than 10%, then would drop his sotalol dose back to 120Bid.

## 2011-04-30 NOTE — ED Notes (Signed)
[id number] Ready

## 2011-04-30 NOTE — ED Provider Notes (Signed)
History     CSN: [id number]  Arrival date & time 04/30/11  1305   First MD Initiated Contact with Patient 04/30/11 1307      No chief complaint on file.   (Consider location/radiation/quality/duration/timing/severity/associated sxs/prior treatment) The history is provided by the patient and the EMS personnel.  pt called ems re palpitations, chest pain, felt weak, sob. ems noted hr in 150-160 range and pt w chest pain and hypotension - ems cardiverted pt, 100 J, versed 2 mg - note conversion to sinus rhythm w resolution cp and sob. On arrival to ED no cp or sob. Pt had recent ICD lead revision and generator removal/replacement 04/28/11. Pt also w hx afib. Pt notes symptoms started suddenly just pta. Denies other recent episodes cp. Symptoms improved post cardioversion, pt unaware of exacerbating or alleviating factors.   Past Medical History  Diagnosis Date  . Coronary atherosclerosis of unspecified type of vessel, native or graft   . Unspecified essential hypertension   . Benign prostatic hypertrophy   . Peptic ulcer disease   . Diverticulosis   . SOB (shortness of breath)   . GERD (gastroesophageal reflux disease)   . Ischemic cardiomyopathy   . Congestive heart failure, unspecified   . Arrhythmia     paroxysmal afib  . Ventricular tachycardia      monomorphic VT  . MI (myocardial infarction) 1985    "big MI after I got to hospital"  . Acute myocardial infarction, unspecified site, episode of care unspecified 1985    "on way to hospital"  . Pneumonia 1980's  . Anxiety   . Pancreatitis 1980's    Past Surgical History  Procedure Date  . Icd 2001; 2007; 04/28/11    generator change; complete replacement; lead replacement  . Transthoracic echocardiogram 11/2005  . Laparotomy 11/2005    for small bowel obstruction,  . Cardiac catheterization 11/23/2005  . Cardiac defibrillator placement 1996  . Cholecystectomy 1980's  . Cataract extraction 2012    left eye    Family  History  Problem Relation Age of Onset  . Heart disease Mother   . Heart disease Daughter     History  Substance Use Topics  . Smoking status: Former Smoker -- 2.0 packs/day for 40 years    Types: Cigarettes  . Smokeless tobacco: Former Neurosurgeon    Types: Chew, Snuff   Comment: quit smoking cigarettes 1985; quit smokeless tobacco ~ 2011  . Alcohol Use: No      Review of Systems  Constitutional: Negative for fever.  HENT: Negative for neck pain.   Eyes: Negative for visual disturbance.  Respiratory: Positive for shortness of breath.   Cardiovascular: Positive for chest pain and palpitations.  Gastrointestinal: Negative for abdominal pain.  Genitourinary: Negative for flank pain.  Musculoskeletal: Negative for back pain.  Skin: Negative for rash.  Neurological: Negative for headaches.  Hematological: Does not bruise/bleed easily.  Psychiatric/Behavioral: Negative for confusion.    Allergies  Sulfonamide derivatives  Home Medications   Current Outpatient Rx  Name Route Sig Dispense Refill  . ASPIRIN 81 MG PO TABS Oral Take 81 mg by mouth daily.      . FUROSEMIDE 20 MG PO TABS Oral Take 20 mg by mouth daily.     Marland Kitchen LORAZEPAM 0.5 MG PO TABS Oral Take 0.5 mg by mouth every 8 (eight) hours. For anxiety. Twice a day & at bedtime    . MULTI VITAMIN MENS PO Oral Take by mouth.      Marland Kitchen  NITROGLYCERIN 0.4 MG SL SUBL Sublingual Place 0.4 mg under the tongue every 5 (five) minutes as needed.      Marland Kitchen PANTOPRAZOLE SODIUM 40 MG PO TBEC Oral Take 40 mg by mouth 2 (two) times daily.     Marland Kitchen POTASSIUM CHLORIDE CRYS ER 20 MEQ PO TBCR Oral Take 20 mEq by mouth daily.      Marland Kitchen RANITIDINE HCL 300 MG PO TABS Oral Take 300 mg by mouth at bedtime.      Marland Kitchen SOTALOL HCL 120 MG PO TABS Oral Take 1 tablet (120 mg total) by mouth 2 (two) times daily. 180 tablet 3    There were no vitals taken for this visit.  Physical Exam  Nursing note and vitals reviewed. Constitutional: He is oriented to person, place,  and time. He appears well-developed and well-nourished. No distress.  HENT:  Head: Atraumatic.  Mouth/Throat: Oropharynx is clear and moist.  Eyes: Pupils are equal, round, and reactive to light.  Neck: Neck supple. No tracheal deviation present.  Cardiovascular: Normal rate, normal heart sounds and intact distal pulses.   Pulmonary/Chest: Effort normal and breath sounds normal. No accessory muscle usage. No respiratory distress.  Abdominal: Soft. Bowel sounds are normal. He exhibits no distension. There is no tenderness.  Musculoskeletal: Normal range of motion. He exhibits no edema and no tenderness.  Neurological: He is alert and oriented to person, place, and time.  Skin: Skin is warm and dry.  Psychiatric: He has a normal mood and affect.    ED Course  Procedures (including critical care time)   Labs Reviewed  GLUCOSE, CAPILLARY  CBC  DIFFERENTIAL  COMPREHENSIVE METABOLIC PANEL  I-STAT TROPONIN I  PROTIME-INR  URINALYSIS, ROUTINE W REFLEX MICROSCOPIC  CARDIAC PANEL(CRET KIN+CKTOT+MB+TROPI)   Dg Chest 2 View  04/29/2011  *RADIOLOGY REPORT*  Clinical Data: Status post pacemaker placement; sore arm  CHEST - 2 VIEW  Comparison: April 28, 2011  Findings: The three pacemaker leads are intact with tips located over the right atrium and right ventricle.  No pneumothorax.  Mild cardiomegaly is unchanged.  The mediastinum and pulmonary vasculature are within normal limits.  There is stable left basilar atelectasis / scarring. There is anterior wedge compression of a lower thoracic / upper lumbar vertebral body, stable.  IMPRESSION: Three pacemaker leads are intact and in good position over the right atrium and right ventricle.  No pneumothorax.  Normal pulmonary vasculature.  Original Report Authenticated By: Brandon Melnick, M.D.        MDM  bp low. Hx ishc cm/sys heeart failure - will give small quantity fluid bolus.   Reviewed prior chart, baseline bp appears in the 85-95  range.    Date: 04/30/2011  Rate: 75  Rhythm: atrial pacing  QRS Axis: normal  Intervals: atrial pacing  ST/T Wave abnormalities: nonspecific ST/T changes  Conduction Disutrbances:nonspecific intraventricular conduction delay  Narrative Interpretation:   Old EKG Reviewed: unchanged           Suzi Roots, MD 05/08/11 1234

## 2011-04-30 NOTE — ED Notes (Signed)
Pt presents from home, he had recent pacemaker replacement surgery. He states that today while sitting on the couch he had sudden onset of dizziness, weakness, and feeling like he was going to pass out. Upon ems arrival he had a wide complex tachycardia with a rate in the 150's. Pt had a diminishing loc and was hypotensive upon ems arrival. They started an iv and gave him 2mg  versed and cardioverted him at 100 joules where he returned to a brady/sinus rhythm. ems reported that patient was having some chest pain rated 4/10 prior to cardioversion. Protocols initiated upon arrival to dept. Pt is hypotensive. Iv bolus started along with additional lines.

## 2011-05-01 ENCOUNTER — Other Ambulatory Visit: Payer: Self-pay

## 2011-05-01 DIAGNOSIS — I4891 Unspecified atrial fibrillation: Secondary | ICD-10-CM

## 2011-05-01 LAB — LIPID PANEL
HDL: 42 mg/dL (ref >39)
Triglycerides: 80 mg/dL (ref <150)

## 2011-05-01 LAB — BASIC METABOLIC PANEL
BUN: 12 mg/dL (ref 6–23)
Calcium: 8.5 mg/dL (ref 8.4–10.5)
GFR calc Af Amer: >90 mL/min (ref >90)
GFR calc non Af Amer: 83 mL/min — ABNORMAL LOW (ref >90)
Glucose, Bld: 88 mg/dL (ref 70–99)
Potassium: 3.6 mEq/L (ref 3.5–5.1)
Sodium: 139 mEq/L (ref 135–145)

## 2011-05-01 LAB — CBC
MCH: 30.8 pg (ref 26.0–34.0)
MCHC: 33.4 g/dL (ref 30.0–36.0)
RDW: 13.9 % (ref 11.5–15.5)

## 2011-05-01 LAB — CARDIAC PANEL(CRET KIN+CKTOT+MB+TROPI)
CK, MB: 2 ng/mL (ref 0.3–4.0)
Total CK: 37 U/L (ref 7–232)
Troponin I: <0.3 ng/mL (ref <0.30)

## 2011-05-01 MED ORDER — POTASSIUM CHLORIDE 20 MEQ/15ML (10%) PO LIQD
20.0000 meq | Freq: Every day | ORAL | Status: DC
  Administered 2011-05-02: 20 meq via ORAL
  Filled 2011-05-01: qty 15

## 2011-05-01 NOTE — Progress Notes (Signed)
05/01/11 1958 Called report to floor  2000.  Pt stable assist to Br without problems.  Left message with daughter of pt transfer to floor. Sending to floor via wheelchair and CN . George Acosta

## 2011-05-01 NOTE — Progress Notes (Signed)
Subjective:   George Acosta is an 78 year old gentleman with a history of coronary artery disease, ventricular tachycardia-status post AICD placement, congestive heart failure. He was admitted yesterday with ventricular tachycardia requiring cardioversion.  George Acosta is doing very well. He has not had any episodes of chest pain. He's not had any further episodes of ventricular tachycardia.    Marland Kitchen aspirin EC  81 mg Oral Daily  . Chlorhexidine Gluconate Cloth  6 each Topical Q0600  . famotidine  20 mg Oral Daily  . furosemide  20 mg Oral Daily  . heparin  5,000 Units Subcutaneous Q8H  . LORazepam  0.5 mg Oral Q8H  . mulitivitamin with minerals  1 tablet Oral Daily  . mupirocin ointment  1 application Nasal BID  . pantoprazole  40 mg Oral BID  . potassium chloride SA  20 mEq Oral Daily  . sodium chloride  250 mL Intravenous Once  . sodium chloride  3 mL Intravenous Q12H  . sotalol  160 mg Oral BID  . DISCONTD: aspirin EC  81 mg Oral Daily  . DISCONTD: famotidine  20 mg Oral Daily  . DISCONTD: furosemide  20 mg Oral Daily  . DISCONTD: MULTI VITAMIN MENS  1 tablet Oral Daily  . DISCONTD: potassium chloride SA  20 mEq Oral Daily  . DISCONTD: sodium chloride  500 mL Intravenous Once  . DISCONTD: sotalol  160 mg Oral BID      Objective:  Vital Signs in the last 24 hours: Blood pressure 97/45, pulse 50, temperature 98.3 F (36.8 C), temperature source Oral, resp. rate 16, height 6' (1.829 m), weight 151 lb 0.2 oz (68.5 kg), SpO2 100.00%. Temp:  [97.7 F (36.5 C)-99.6 F (37.6 C)] 98.3 F (36.8 C) (01/19 0700) Pulse Rate:  [50-81] 50  (01/19 0700) Resp:  [14-20] 16  (01/19 0700) BP: (65-97)/(41-54) 97/45 mmHg (01/19 0700) SpO2:  [88 %-100 %] 100 % (01/19 0700) Weight:  [151 lb 0.2 oz (68.5 kg)-153 lb 7 oz (69.6 kg)] 151 lb 0.2 oz (68.5 kg) (01/19 0500)  Intake/Output from previous day: 01/18 0701 - 01/19 0700 In: 1670 [P.O.:420; I.V.:1250] Out: 875 [Urine:875] Intake/Output  from this shift:    Physical Exam: The patient is alert and oriented x 3.  The mood and affect are normal.   Skin: warm and dry.  Color is normal.    HEENT:   the sclera are nonicteric.  The mucous membranes are moist.  The carotids are 2+ without bruits.  There is no thyromegaly.  There is no JVD.    Lungs: clear.  The chest wall is non tender.  The pacemaker in his left subclavian area is clean and dry. Steri-Strips are in place.  Heart: regular rate with a normal S1 and S2.  There are no murmurs, gallops, or rubs. The PMI is not displaced.     Abdomen: good bowel sounds.  There is no guarding or rebound.  There is no hepatosplenomegaly or tenderness.  There are no masses.   Extremities:  no clubbing, cyanosis, or edema.  The legs are without rashes.  The distal pulses are intact.   Neuro:  Cranial nerves II - XII are intact.  Motor and sensory functions are intact.     Lab Results:  Basename 05/01/11 0500 04/30/11 1748  WBC 4.6 5.1  HGB 11.3* 11.7*  PLT 129* 166    Basename 05/01/11 0500 04/30/11 1748 04/30/11 1310  NA 139 -- 139  K 3.6 -- 3.9  CL 104 -- 102  CO2 29 -- 27  GLUCOSE 88 -- 93  BUN 12 -- 15  CREATININE 0.78 0.88 --    Basename 04/30/11 2348 04/30/11 1748  TROPONINI <0.30 <0.30   No results found for this basename: BNP in the last 72 hours Hepatic Function Panel  Basename 04/30/11 1310  PROT 6.8  ALBUMIN 3.5  AST 14  ALT 6  ALKPHOS 85  BILITOT 0.7  BILIDIR --  IBILI --   Lab Results  Component Value Date   CHOL 133 05/01/2011   HDL 42 05/01/2011   LDLCALC 75 05/01/2011   TRIG 80 05/01/2011   CHOLHDL 3.2 05/01/2011    Basename 04/30/11 1310  INR 1.11    Telemetry: atrial pacing.  Assessment/Plan:   VENTRICULAR TACHYCARDIA (09/24/2008)   Dr. [name] has increased his sotalol to 160 mg twice a day. He seems to be tolerating this fairly well. We'll move him to telemetry. We will anticipate him being discharged tomorrow if he remains stable.     CHF (09/24/2008)   AUTOMATIC IMPLANTABLE CARDIAC DEFIBRILLATOR SITU (09/24/2008)  Ischemic cardiomyopathy ()   Paroxysmal atrial fibrillation ()    HYPERTENSION (09/24/2008)   CORONARY ARTERY DISEASE (09/24/2008)   Disposition: Home on Sunday if he remains stable on the current dose of sotalol. We'll check an EKG tomorrow.  Vesta Mixer, Montez Hageman., MD, [facility] 05/01/2011, 8:21 AM LOS: Day 1

## 2011-05-01 NOTE — Plan of Care (Signed)
Problem: Phase I Progression Outcomes Goal: Initial discharge plan identified Outcome: Completed/Met Date Met:  05/01/11 Return home w/daughter

## 2011-05-01 NOTE — Progress Notes (Signed)
Contact precautions maintained; however, family refuses to wear PPE even after being encouraged to do so.

## 2011-05-02 ENCOUNTER — Other Ambulatory Visit: Payer: Self-pay

## 2011-05-02 MED ORDER — SOTALOL HCL 160 MG PO TABS: 160.0000 mg | ORAL_TABLET | Freq: Two times a day (BID) | ORAL | Status: DC

## 2011-05-02 MED ORDER — MUPIROCIN 2 % EX OINT: 1.0000 "application " | TOPICAL_OINTMENT | Freq: Two times a day (BID) | CUTANEOUS | Status: AC

## 2011-05-02 NOTE — Progress Notes (Addendum)
DC'd patient home alone.  RN reviewed discharge instructions and med rec with patient. Patient verbalized understanding of follow up appointment with MD. RN dc'd IV with catheter intact.  Patient lives with family. Daughter is primary caregiver.

## 2011-05-02 NOTE — Discharge Summary (Signed)
Patient ID: George Acosta,  MRN: [id number], DOB/AGE: May 06, 1928 78 y.o.  Admit date: 04/30/2011 Discharge date: 05/02/2011  Primary Care Provider: [place] Citizen Primary Cardiologist: Reece Agar. [facility]  Discharge Diagnoses Principal Problem:  *VENTRICULAR TACHYCARDIA Active Problems:  CORONARY ARTERY DISEASE  CHF  Ischemic cardiomyopathy  HYPERTENSION  AUTOMATIC IMPLANTABLE CARDIAC DEFIBRILLATOR SITU  Paroxysmal atrial fibrillation   Allergies Allergies  Allergen Reactions  . Sulfonamide Derivatives     Procedures  None  History of Present Illness  78 year old male with the above problem list who recently underwent elective ICD generator change out and lead revision secondary to excessive noise on the ventricular lead. He was discharged on January 17 for [place] following the procedure. His perioperative course was uncomplicated.  On the afternoon of January 18, patient was sitting at home and had sudden onset of bilateral arm pain and dyspnea associated with numbness across his chest. His son called EMS and patient was found to be in a wide-complex tachycardia in the 140s. Patient denied tachypalpitations or presyncope. Patient was placed in the ambulance and en route to [facility] cone he was defibrillated with 200 J and he was subsequently noted to be in an atrial paced rhythm. Patient never experienced syncope. In the emergency department he was mildly hypotensive but was otherwise asymptomatic. His device was interrogated and showed ventricular tachycardia as well as nonsustained ventricular tachycardia with antitachycardia pacing provided by his device. He was admitted for further evaluation and adjustment of his antiarrhythmic dosing.  Hospital Course   Following admission, the patient's sotalol dose was increased from 120 mg twice a day to 160 mg twice a day. His ECG has been followed closely and he has had no widening of his QTC. Further, he has had no recurrence of ventricular  arrhythmias. Because his VT in the field was below his device's previous lower detection rate of 150 beats per minute, the lower detection rate was reduced to 140 beats per minute during this admission. George Acosta will be discharged home today in good condition.  Discharge Vitals:  Blood pressure 112/70, pulse 51, temperature 97.7 F (36.5 C), temperature source Oral, resp. rate 18, height 6' (1.829 m), weight 151 lb 14.4 oz (68.9 kg), SpO2 93.00%.   Labs: CBC:  Basename 05/01/11 0500 04/30/11 1748 04/30/11 1310  WBC 4.6 5.1 --  NEUTROABS -- -- 3.9  HGB 11.3* 11.7* --  HCT 33.8* 35.0* --  MCV 92.1 92.3 --  PLT 129* 166 --   Basic Metabolic Panel:  Basename 05/01/11 0500 04/30/11 1748 04/30/11 1310  NA 139 -- 139  K 3.6 -- 3.9  CL 104 -- 102  CO2 29 -- 27  GLUCOSE 88 -- 93  BUN 12 -- 15  CREATININE 0.78 0.88 --  CALCIUM 8.5 -- 8.9  MG -- 1.9 --  PHOS -- -- --   Liver Function Tests:  Basename 04/30/11 1310  AST 14  ALT 6  ALKPHOS 85  BILITOT 0.7  PROT 6.8  ALBUMIN 3.5   Cardiac Enzymes:  Basename 04/30/11 2348 04/30/11 1748 04/30/11 1315  CKTOTAL 37 41 46  CKMB 2.0 2.0 1.8  CKMBINDEX -- -- --  TROPONINI <0.30 <0.30 <0.30   Fasting Lipid Panel:  Basename 05/01/11 0500  CHOL 133  HDL 42  LDLCALC 75  TRIG 80  CHOLHDL 3.2  LDLDIRECT --   Thyroid Function Tests:  Basename 04/30/11 1748  TSH 1.563  T4TOTAL --  T3FREE --  THYROIDAB --    Disposition:  Follow-up  Information    Follow up with [facility], MD.      Follow up with Lewayne Bunting, MD. (approx 2-4 wks - we will arrange)    Contact information:   [address] [place] [address] [phone]          Discharge Medications: Current Discharge Medication List    START taking these medications   Details  mupirocin ointment (BACTROBAN) 2 % Apply 1 application topically 2 (two) times daily. X 5 days. Qty: 22 g      CONTINUE  these medications which have CHANGED   Details  sotalol (BETAPACE) 160 MG tablet Take 1 tablet (160 mg total) by mouth 2 (two) times daily. Qty: 60 tablet, Refills: 6      CONTINUE these medications which have NOT CHANGED   Details  aspirin 81 MG tablet Take 81 mg by mouth daily.      furosemide (LASIX) 20 MG tablet Take 20 mg by mouth daily.     LORazepam (ATIVAN) 0.5 MG tablet Take 0.5 mg by mouth every 8 (eight) hours. For anxiety. Twice a day & at bedtime    Multiple Vitamin (MULTI VITAMIN MENS PO) Take by mouth.      nitroGLYCERIN (NITROSTAT) 0.4 MG SL tablet Place 0.4 mg under the tongue every 5 (five) minutes as needed.      pantoprazole (PROTONIX) 40 MG tablet Take 40 mg by mouth 2 (two) times daily.     potassium chloride SA (K-DUR,KLOR-CON) 20 MEQ tablet Take 20 mEq by mouth daily.      ranitidine (ZANTAC) 300 MG tablet Take 300 mg by mouth at bedtime.          Outstanding Labs/Studies  None  Duration of Discharge Encounter: Greater than 30 minutes including physician time.  Signed, Nicolasa Ducking NP 05/02/2011, 11:12 AM    Cardiology Attending Patient interviewed and examined. Discussed with Mr. Brion Aliment, NP.  Above note annotated and modified based upon my findings.  Wyocena Bing, MD 05/02/2011, 11:53 AM

## 2011-05-02 NOTE — Progress Notes (Signed)
George Acosta  78 y.o.  male  Subjective: No chest pain nor dyspnea.  Experienced weakness with VT.  Occassional episodes of lightheadedness as an outpatient that are brief and improve after he sits for a spell.  No syncope or falls.  Allergy: Sulfonamide derivatives  Objective: Vital signs in last 24 hours: Temp:  [97.5 F (36.4 C)-98.5 F (36.9 C)] 97.7 F (36.5 C) (01/20 0500) Pulse Rate:  [51-62] 51  (01/20 0500) Resp:  [15-18] 18  (01/20 0500) BP: (81-112)/(48-65) 97/61 mmHg (01/20 0500) SpO2:  [93 %-100 %] 93 % (01/20 0500) Weight:  [68 kg (149 lb 14.6 oz)-68.9 kg (151 lb 14.4 oz)] 68.9 kg (151 lb 14.4 oz) (01/20 0500)  68.9 kg (151 lb 14.4 oz) Body mass index is 20.60 kg/(m^2).  Weight change: -1.6 kg (-3 lb 8.4 oz) Last BM Date: 05/01/11  Intake/Output from previous day: 01/19 0701 - 01/20 0700 In: 810 [P.O.:810] Out: 800 [Urine:800]  General- Well developed; no acute distress  Neck- No JVD, no carotid bruits Lungs- clear lung fields; normal I:E ratio Cardiovascular- normal PMI; normal S1 and S2; George SEM Abdomen- normal bowel sounds; soft and non-tender without masses or organomegaly Skin- Warm, no significant lesions  No labs. EKG-no available for review. Telemetry: NSR and SB  Medications: I have reviewed the patient's current medications.    Marland Kitchen aspirin EC  81 mg Oral Daily  . Chlorhexidine Gluconate Cloth  6 each Topical Q0600  . famotidine  20 mg Oral Daily  . furosemide  20 mg Oral Daily  . heparin  5,000 Units Subcutaneous Q8H  . LORazepam  0.5 mg Oral Q8H  . mulitivitamin with minerals  1 tablet Oral Daily  . mupirocin ointment  1 application Nasal BID  . pantoprazole  40 mg Oral BID  . potassium chloride  20 mEq Oral Daily  . sodium chloride  3 mL Intravenous Q12H  . sotalol  160 mg Oral BID  . DISCONTD: potassium chloride SA  20 mEq Oral Daily    Assessment/Plan: Doing well with no recurrent arrhythmia.  Will proceed with discharge.  [name], [name], [name], perhaps orthostatic, but is being careful not to fall.  F/U with EP.  15 minutes MD time   LOS: 2 days   George Acosta 05/02/2011, 9:38 AM

## 2011-05-06 ENCOUNTER — Other Ambulatory Visit: Payer: Self-pay

## 2011-05-06 ENCOUNTER — Other Ambulatory Visit: Payer: Self-pay | Admitting: Internal Medicine

## 2011-05-06 ENCOUNTER — Encounter: Payer: Self-pay | Admitting: Internal Medicine

## 2011-05-06 ENCOUNTER — Ambulatory Visit (INDEPENDENT_AMBULATORY_CARE_PROVIDER_SITE_OTHER): Payer: Medicare HMO | Admitting: *Deleted

## 2011-05-06 DIAGNOSIS — I2589 Other forms of chronic ischemic heart disease: Secondary | ICD-10-CM

## 2011-05-06 DIAGNOSIS — I255 Ischemic cardiomyopathy: Secondary | ICD-10-CM

## 2011-05-06 LAB — ICD DEVICE OBSERVATION
AL IMPEDENCE ICD: 547 Ohm
CHARGE TIME: 8.6 s
DEV-0020ICD: NEGATIVE
TZON-0003FASTVT: 315.7 ms
TZON-0003SLOWVT: 428.5 ms

## 2011-05-06 MED ORDER — NITROGLYCERIN 0.4 MG SL SUBL: 0.4000 mg | SUBLINGUAL_TABLET | SUBLINGUAL | Status: DC | PRN

## 2011-05-06 NOTE — Progress Notes (Signed)
Wound check-ICD 

## 2011-08-05 ENCOUNTER — Ambulatory Visit (INDEPENDENT_AMBULATORY_CARE_PROVIDER_SITE_OTHER): Payer: Medicare HMO | Admitting: Internal Medicine

## 2011-08-05 ENCOUNTER — Encounter: Payer: Self-pay | Admitting: Internal Medicine

## 2011-08-05 VITALS — BP 114/70 | HR 55 | Ht 70.0 in | Wt 149.4 lb

## 2011-08-05 DIAGNOSIS — I472 Ventricular tachycardia: Secondary | ICD-10-CM

## 2011-08-05 DIAGNOSIS — I255 Ischemic cardiomyopathy: Secondary | ICD-10-CM

## 2011-08-05 DIAGNOSIS — I2589 Other forms of chronic ischemic heart disease: Secondary | ICD-10-CM

## 2011-08-05 DIAGNOSIS — Z95810 Presence of automatic (implantable) cardiac defibrillator: Secondary | ICD-10-CM

## 2011-08-05 DIAGNOSIS — T82110A Breakdown (mechanical) of cardiac electrode, initial encounter: Secondary | ICD-10-CM

## 2011-08-05 LAB — ICD DEVICE OBSERVATION
AL AMPLITUDE: 1.2 mv
AL IMPEDENCE ICD: 533 Ohm
DEVICE MODEL ICD: 102353
PACEART VT: 9
RV LEAD AMPLITUDE: 16.5 mv
TZON-0003SLOWVT: 428.5 ms
VENTRICULAR PACING ICD: 1 pct

## 2011-08-05 NOTE — Assessment & Plan Note (Signed)
His ventricular arrhythmias appear to be well-controlled. He will continue his current dose of sotalol. Ultimately, we may have to reduce his sotalol dose.

## 2011-08-05 NOTE — Assessment & Plan Note (Signed)
His device is working normally. We'll plan to recheck in several months. 

## 2011-08-05 NOTE — Assessment & Plan Note (Signed)
His device is working normally. We'll plan to recheck in several months. Incision is well-healed.

## 2011-08-05 NOTE — Patient Instructions (Signed)
Your physician wants you to follow-up in: January 2014 You will receive a reminder letter in the mail two months in advance. If you don't receive a letter, please call our office to schedule the follow-up appointment.  

## 2011-08-05 NOTE — Progress Notes (Signed)
HPI [name] he returns today for followup. He is a very pleasant 78 year old man with an ischemic cardiomyopathy, chronic systolic heart failure, ventricular tachycardia, status post ICD implantation. The patient denies chest pain, shortness of breath, peripheral edema, or syncope. He does admit to being sedentary. Allergies  Allergen Reactions  . Sulfonamide Derivatives      Current Outpatient Prescriptions  Medication Sig Dispense Refill  . aspirin 81 MG tablet Take 81 mg by mouth daily.        . furosemide (LASIX) 20 MG tablet Take 20 mg by mouth daily.       Marland Kitchen LORazepam (ATIVAN) 0.5 MG tablet Take 0.5 mg by mouth every 8 (eight) hours. For anxiety. Twice a day & at bedtime      . Multiple Vitamin (MULTI VITAMIN MENS PO) Take by mouth.        . nitroGLYCERIN (NITROSTAT) 0.4 MG SL tablet Place 1 tablet (0.4 mg total) under the tongue every 5 (five) minutes as needed.  25 tablet  2  . pantoprazole (PROTONIX) 40 MG tablet Take 40 mg by mouth daily.       . potassium chloride SA (K-DUR,KLOR-CON) 20 MEQ tablet Take 20 mEq by mouth daily.        . ranitidine (ZANTAC) 300 MG tablet Take 300 mg by mouth at bedtime.        . sotalol (BETAPACE) 160 MG tablet Take 1 tablet (160 mg total) by mouth 2 (two) times daily.  60 tablet  6     Past Medical History  Diagnosis Date  . Coronary atherosclerosis of unspecified type of vessel, native or graft   . Unspecified essential hypertension   . Benign prostatic hypertrophy   . Peptic ulcer disease   . Diverticulosis   . SOB (shortness of breath)   . GERD (gastroesophageal reflux disease)   . Ischemic cardiomyopathy   . Congestive heart failure, unspecified   . Arrhythmia     paroxysmal afib  . Ventricular tachycardia     A.   monomorphic VT  B. 04/2011 s/p generator change - [employer] ICD  [phone]  . MI (myocardial infarction) 1985    "big MI after I got to hospital"  . Acute myocardial infarction, unspecified site, episode  of care unspecified 1985    "on way to hospital"  . Pneumonia 1980's  . Anxiety   . Pancreatitis 1980's  . Paroxysmal atrial fibrillation     ROS:   All systems reviewed and negative except as noted in the HPI.   Past Surgical History  Procedure Date  . Icd 2001; 2007; 04/28/11    generator change; complete replacement; lead replacement  . Transthoracic echocardiogram 11/2005  . Laparotomy 11/2005    for small bowel obstruction,  . Cardiac catheterization 11/23/2005  . Cardiac defibrillator placement 1996  . Cholecystectomy 1980's  . Cataract extraction 2012    left eye     Family History  Problem Relation Age of Onset  . Heart disease Mother   . Heart disease Daughter      History   Social History  . Marital Status: Widowed    Spouse Name: N/A    Number of Children: N/A  . Years of Education: N/A   Occupational History  . retired Naval architect    Social History Main Topics  . Smoking status: Former Smoker -- 2.0 packs/day for 40 years    Types: Cigarettes  . Smokeless tobacco: Former Neurosurgeon  Types: Chew, Snuff   Comment: quit smoking cigarettes 1985; quit smokeless tobacco ~ 2011  . Alcohol Use: No  . Drug Use: No  . Sexually Active: No   Other Topics Concern  . Not on file   Social History Narrative   The patient lives in [place]. He is widowed. 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 Well-healed ICD incision. HEART:  Regular rate rhythm, no murmurs, no rubs, no clicks Abd:  soft, positive bowel sounds, no organomegally, no rebound, no guarding Ext:  2 plus pulses, no edema, no cyanosis, no clubbing Skin:  No rashes no nodules Neuro:  CN  II through XII intact, motor grossly intact   DEVICE  Normal device function.  See PaceArt for details.   Assess/Plan:

## 2011-11-11 ENCOUNTER — Ambulatory Visit (INDEPENDENT_AMBULATORY_CARE_PROVIDER_SITE_OTHER): Payer: Medicare HMO | Admitting: *Deleted

## 2011-11-11 ENCOUNTER — Encounter: Payer: Self-pay | Admitting: Internal Medicine

## 2011-11-11 DIAGNOSIS — I255 Ischemic cardiomyopathy: Secondary | ICD-10-CM

## 2011-11-11 DIAGNOSIS — I2589 Other forms of chronic ischemic heart disease: Secondary | ICD-10-CM

## 2011-11-15 LAB — REMOTE ICD DEVICE
AL AMPLITUDE: 1.2 mv
DEV-0020ICD: NEGATIVE
FVT: 0
HV IMPEDENCE: 84 Ohm
PACEART VT: 0
TZAT-0001FASTVT: 1
TZAT-0001SLOWVT: 1
TZAT-0013FASTVT: 1
TZAT-0013SLOWVT: 3
TZAT-0018FASTVT: NEGATIVE
TZAT-0018SLOWVT: NEGATIVE
TZAT-0018SLOWVT: NEGATIVE
TZST-0001FASTVT: 3
TZST-0001FASTVT: 4
TZST-0001FASTVT: 6
TZST-0001SLOWVT: 3
TZST-0001SLOWVT: 4
TZST-0001SLOWVT: 7
TZST-0003FASTVT: 26 J
TZST-0003FASTVT: 41 J
TZST-0003FASTVT: 41 J
TZST-0003SLOWVT: 41 J
TZST-0003SLOWVT: 41 J
VENTRICULAR PACING ICD: 2 pct
VF: 0

## 2011-11-24 ENCOUNTER — Other Ambulatory Visit: Payer: Self-pay | Admitting: Internal Medicine

## 2011-11-24 MED ORDER — SOTALOL HCL 160 MG PO TABS: 160.0000 mg | ORAL_TABLET | Freq: Two times a day (BID) | ORAL | Status: DC

## 2012-02-10 ENCOUNTER — Other Ambulatory Visit: Payer: Self-pay | Admitting: Internal Medicine

## 2012-02-10 ENCOUNTER — Encounter: Payer: Self-pay | Admitting: Internal Medicine

## 2012-02-10 ENCOUNTER — Ambulatory Visit (INDEPENDENT_AMBULATORY_CARE_PROVIDER_SITE_OTHER): Payer: Medicare HMO | Admitting: *Deleted

## 2012-02-10 DIAGNOSIS — I255 Ischemic cardiomyopathy: Secondary | ICD-10-CM

## 2012-02-10 DIAGNOSIS — Z95810 Presence of automatic (implantable) cardiac defibrillator: Secondary | ICD-10-CM

## 2012-02-10 DIAGNOSIS — I2589 Other forms of chronic ischemic heart disease: Secondary | ICD-10-CM

## 2012-02-11 LAB — REMOTE ICD DEVICE
AL AMPLITUDE: 1.3 mv
AL IMPEDENCE ICD: 534 Ohm
ATRIAL PACING ICD: 43 pct
CHARGE TIME: 8.9 s
DEV-0020ICD: NEGATIVE
RV LEAD AMPLITUDE: 14.1 mv
TOT-0006: 20130801000000
TZAT-0001FASTVT: 2
TZAT-0001SLOWVT: 2
TZAT-0002FASTVT: NEGATIVE
TZAT-0013FASTVT: 1
TZAT-0018FASTVT: NEGATIVE
TZAT-0018SLOWVT: NEGATIVE
TZON-0003SLOWVT: 428.6 ms
TZST-0001FASTVT: 3
TZST-0001FASTVT: 4
TZST-0001FASTVT: 8
TZST-0001SLOWVT: 5
TZST-0001SLOWVT: 7
TZST-0003FASTVT: 41 J
TZST-0003FASTVT: 41 J
TZST-0003FASTVT: 41 J
TZST-0003FASTVT: 41 J
TZST-0003SLOWVT: 26 J
TZST-0003SLOWVT: 41 J
VENTRICULAR PACING ICD: 2 pct
VF: 0

## 2012-03-01 ENCOUNTER — Encounter: Payer: Self-pay | Admitting: *Deleted

## 2012-03-12 ENCOUNTER — Encounter (HOSPITAL_COMMUNITY): Payer: Self-pay | Admitting: Cardiology

## 2012-03-12 ENCOUNTER — Observation Stay (HOSPITAL_COMMUNITY)
Admission: AD | Admit: 2012-03-12 | Discharge: 2012-03-13 | Disposition: A | Discharge status: Home / Self Care | Payer: Medicare HMO | Source: Other Acute Inpatient Hospital | Attending: Cardiology | Admitting: Cardiology

## 2012-03-12 DIAGNOSIS — I4891 Unspecified atrial fibrillation: Secondary | ICD-10-CM | POA: Insufficient documentation

## 2012-03-12 DIAGNOSIS — I255 Ischemic cardiomyopathy: Secondary | ICD-10-CM | POA: Diagnosis present

## 2012-03-12 DIAGNOSIS — I2589 Other forms of chronic ischemic heart disease: Secondary | ICD-10-CM | POA: Insufficient documentation

## 2012-03-12 DIAGNOSIS — I110 Hypertensive heart disease with heart failure: Secondary | ICD-10-CM | POA: Insufficient documentation

## 2012-03-12 DIAGNOSIS — Z7982 Long term (current) use of aspirin: Secondary | ICD-10-CM | POA: Insufficient documentation

## 2012-03-12 DIAGNOSIS — I2510 Atherosclerotic heart disease of native coronary artery without angina pectoris: Secondary | ICD-10-CM | POA: Insufficient documentation

## 2012-03-12 DIAGNOSIS — I119 Hypertensive heart disease without heart failure: Secondary | ICD-10-CM | POA: Diagnosis present

## 2012-03-12 DIAGNOSIS — K219 Gastro-esophageal reflux disease without esophagitis: Secondary | ICD-10-CM | POA: Insufficient documentation

## 2012-03-12 DIAGNOSIS — Z95810 Presence of automatic (implantable) cardiac defibrillator: Secondary | ICD-10-CM | POA: Insufficient documentation

## 2012-03-12 DIAGNOSIS — I472 Ventricular tachycardia: Secondary | ICD-10-CM

## 2012-03-12 DIAGNOSIS — I252 Old myocardial infarction: Secondary | ICD-10-CM | POA: Insufficient documentation

## 2012-03-12 DIAGNOSIS — I509 Heart failure, unspecified: Secondary | ICD-10-CM | POA: Insufficient documentation

## 2012-03-12 DIAGNOSIS — Z4502 Encounter for adjustment and management of automatic implantable cardiac defibrillator: Secondary | ICD-10-CM

## 2012-03-12 DIAGNOSIS — I480 Paroxysmal atrial fibrillation: Secondary | ICD-10-CM | POA: Diagnosis present

## 2012-03-12 DIAGNOSIS — R000 Tachycardia, unspecified: Principal | ICD-10-CM | POA: Insufficient documentation

## 2012-03-12 LAB — CBC WITH DIFFERENTIAL/PLATELET
Eosinophils Absolute: 0.1 10*3/uL (ref 0.0–0.7)
HCT: 35.1 % — ABNORMAL LOW (ref 39.0–52.0)
Hemoglobin: 12.2 g/dL — ABNORMAL LOW (ref 13.0–17.0)
Lymphs Abs: 1.5 10*3/uL (ref 0.7–4.0)
MCH: 31.9 pg (ref 26.0–34.0)
Monocytes Absolute: 0.8 10*3/uL (ref 0.1–1.0)
Monocytes Relative: 12 % (ref 3–12)
Neutrophils Relative %: 62 % (ref 43–77)
RBC: 3.83 MIL/uL — ABNORMAL LOW (ref 4.22–5.81)

## 2012-03-12 LAB — COMPREHENSIVE METABOLIC PANEL
ALT: 10 U/L (ref 0–53)
AST: 24 U/L (ref 0–37)
Albumin: 3.2 g/dL — ABNORMAL LOW (ref 3.5–5.2)
CO2: 25 mEq/L (ref 19–32)
Calcium: 9.2 mg/dL (ref 8.4–10.5)
Creatinine, Ser: 0.89 mg/dL (ref 0.50–1.35)
GFR calc non Af Amer: 78 mL/min — ABNORMAL LOW (ref >90)
Sodium: 137 mEq/L (ref 135–145)
Total Protein: 6.7 g/dL (ref 6.0–8.3)

## 2012-03-12 LAB — TROPONIN I: Troponin I: <0.3 ng/mL (ref <0.30)

## 2012-03-12 MED ORDER — ASPIRIN 81 MG PO TABS: 81.0000 mg | ORAL_TABLET | Freq: Every day | ORAL | Status: DC

## 2012-03-12 MED ORDER — ENOXAPARIN SODIUM 40 MG/0.4ML ~~LOC~~ SOLN
40.0000 mg | SUBCUTANEOUS | Status: DC
  Administered 2012-03-12: 40 mg via SUBCUTANEOUS
  Filled 2012-03-12 (×2): qty 0.4

## 2012-03-12 MED ORDER — SOTALOL HCL 80 MG PO TABS
160.0000 mg | ORAL_TABLET | Freq: Two times a day (BID) | ORAL | Status: DC
  Administered 2012-03-12 – 2012-03-13 (×2): 160 mg via ORAL
  Filled 2012-03-12 (×3): qty 2

## 2012-03-12 MED ORDER — SODIUM CHLORIDE 0.9 % IV SOLN: 250.0000 mL | INTRAVENOUS | Status: DC | PRN

## 2012-03-12 MED ORDER — SODIUM CHLORIDE 0.9 % IJ SOLN: 3.0000 mL | INTRAMUSCULAR | Status: DC | PRN

## 2012-03-12 MED ORDER — ASPIRIN EC 81 MG PO TBEC
81.0000 mg | DELAYED_RELEASE_TABLET | Freq: Every day | ORAL | Status: DC
  Administered 2012-03-13: 81 mg via ORAL
  Filled 2012-03-12: qty 1

## 2012-03-12 MED ORDER — NITROGLYCERIN 0.4 MG SL SUBL: 0.4000 mg | SUBLINGUAL_TABLET | SUBLINGUAL | Status: DC | PRN

## 2012-03-12 MED ORDER — ACETAMINOPHEN 325 MG PO TABS: 650.0000 mg | ORAL_TABLET | ORAL | Status: DC | PRN

## 2012-03-12 MED ORDER — ONDANSETRON HCL 4 MG/2ML IJ SOLN: 4.0000 mg | Freq: Four times a day (QID) | INTRAMUSCULAR | Status: DC | PRN

## 2012-03-12 MED ORDER — FUROSEMIDE 20 MG PO TABS
20.0000 mg | ORAL_TABLET | Freq: Every day | ORAL | Status: DC
  Administered 2012-03-13: 20 mg via ORAL
  Filled 2012-03-12: qty 1

## 2012-03-12 MED ORDER — PANTOPRAZOLE SODIUM 40 MG PO TBEC
40.0000 mg | DELAYED_RELEASE_TABLET | Freq: Every day | ORAL | Status: DC
  Administered 2012-03-13: 40 mg via ORAL
  Filled 2012-03-12: qty 1

## 2012-03-12 MED ORDER — SOTALOL HCL 80 MG PO TABS: 160.0000 mg | ORAL_TABLET | Freq: Two times a day (BID) | ORAL | Status: DC

## 2012-03-12 MED ORDER — POTASSIUM CHLORIDE CRYS ER 20 MEQ PO TBCR: 20.0000 meq | EXTENDED_RELEASE_TABLET | Freq: Every day | ORAL | Status: DC

## 2012-03-12 MED ORDER — POTASSIUM CHLORIDE CRYS ER 20 MEQ PO TBCR
20.0000 meq | EXTENDED_RELEASE_TABLET | Freq: Every day | ORAL | Status: DC
  Administered 2012-03-13: 20 meq via ORAL
  Filled 2012-03-12: qty 1

## 2012-03-12 MED ORDER — LORAZEPAM 0.5 MG PO TABS
0.5000 mg | ORAL_TABLET | Freq: Three times a day (TID) | ORAL | Status: DC
  Administered 2012-03-12 – 2012-03-13 (×2): 0.5 mg via ORAL
  Filled 2012-03-12 (×2): qty 1

## 2012-03-12 MED ORDER — SODIUM CHLORIDE 0.9 % IJ SOLN
3.0000 mL | Freq: Two times a day (BID) | INTRAMUSCULAR | Status: DC
  Administered 2012-03-12 – 2012-03-13 (×2): 3 mL via INTRAVENOUS

## 2012-03-12 NOTE — H&P (Signed)
History and Physical   Admit date: 03/12/2012 Name:  George Acosta Medical record number: [id number] DOB/Age:  1928/11/17  78 y.o. male  Primary Cardiologist:  Dr. Sharrell Ku  Chief complaint/reason for admission:  defibrillator went off  HPI:  This 78 year old male has a previous history of an inferior myocardial infarction and ischemic cardiomyopathy. He has had an implantable defibrillator since 1996. He had previous to her but discharges when he stopped taking beta blocker therapy and has since been on sotalol. He had a change out of his defibrillator lead and defibrillator earlier this year.  He has not had any recent chest discomfort or shortness of breath. Today he got up to take his dog out for a walk and after getting the dog leash could not open the door. He got somewhat agitated when he was trying to open the door then felt his defibrillator go off. He was taken to the [facility] emergency room and was checked there by the representative of [facility] scientific and found to have had an appropriate discharge for a ventricular arrhythmia. Prior to this the patient had not had any recent medication changes and does not have a history of chest pain or shortness of breath. He was transferred here for further evaluation.    Past Medical History  Diagnosis Date  . Coronary atherosclerosis of unspecified type of vessel, native or graft   . Unspecified essential hypertension   . Benign prostatic hypertrophy   . Peptic ulcer disease   . Diverticulosis   . SOB (shortness of breath)   . GERD (gastroesophageal reflux disease)   . Ischemic cardiomyopathy   . Congestive heart failure, unspecified   . Arrhythmia     paroxysmal afib  . Ventricular tachycardia     A.   monomorphic VT  B. 04/2011 s/p generator change - [employer] ICD  [phone]  . MI (myocardial infarction) 1985    "big MI after I got to hospital"  . Acute myocardial infarction, unspecified site, episode of care  unspecified 1985    "on way to hospital"  . Pneumonia 1980's  . Anxiety   . Pancreatitis 1980's  . Paroxysmal atrial fibrillation      Past Surgical History  Procedure Date  . Icd 2001; 2007; 04/28/11    generator change; complete replacement; lead replacement  . Laparotomy 11/2005    for small bowel obstruction,  . Cardiac catheterization 11/23/2005  . Cardiac defibrillator placement 1996  . Cholecystectomy 1980's  . Cataract extraction 2012    left eye   Allergies: is allergic to sulfonamide derivatives.   Medications: Prior to Admission medications   Medication Sig Start Date End Date Taking? Authorizing Provider  aspirin 81 MG tablet Take 81 mg by mouth daily.     Yes Historical Provider, MD  furosemide (LASIX) 20 MG tablet Take 20 mg by mouth daily.    Yes Historical Provider, MD  LORazepam (ATIVAN) 0.5 MG tablet Take 0.5 mg by mouth every 8 (eight) hours. For anxiety. Twice a day & at bedtime   Yes Historical Provider, MD  Multiple Vitamin (MULTI VITAMIN MENS PO) Take by mouth.     Yes Historical Provider, MD  pantoprazole (PROTONIX) 40 MG tablet Take 40 mg by mouth daily.    Yes Historical Provider, MD  potassium chloride SA (K-DUR,KLOR-CON) 20 MEQ tablet Take 20 mEq by mouth daily.     Yes Historical Provider, MD  ranitidine (ZANTAC) 300 MG tablet Take 300 mg by mouth at  bedtime.     Yes Historical Provider, MD  sotalol (BETAPACE) 160 MG tablet Take 1 tablet (160 mg total) by mouth 2 (two) times daily. 11/24/11 11/23/12 Yes Marinus Maw, MD  nitroGLYCERIN (NITROSTAT) 0.4 MG SL tablet Place 1 tablet (0.4 mg total) under the tongue every 5 (five) minutes as needed. 05/06/11   Marinus Maw, MD   Family History:  Family Status  Relation Status Death Age  . Mother Deceased 14's  . Father Deceased 92's    Social History:   reports that he has quit smoking. His smoking use included Cigarettes. He has a 80 pack-year smoking history. He has quit using smokeless tobacco. His  smokeless tobacco use included Chew and Snuff. He reports that he does not drink alcohol or use illicit drugs.   History   Social History Narrative   The patient lives in [place]. He is widowed. 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 No recent chest discomfort or shortness of breath. Other than as noted above, the remainder of the review of systems is normal  Physical Exam: BP 96/62  Pulse 80  Temp 97.7 F (36.5 C)  Resp 20  Ht 5\' 9"  (1.753 m)  Wt 63.866 kg (140 lb 12.8 oz)  BMI 20.79 kg/m2  SpO2 96% General appearance: alert, cooperative, appears stated age and no distress Head: Normocephalic, without obvious abnormality, atraumatic, Balding male hair pattern Eyes: conjunctivae/corneas clear. PERRL, EOM's intact. Fundi benign. Neck: no adenopathy, no carotid bruit, no JVD and supple, symmetrical, trachea midline Lungs: clear to auscultation bilaterally Heart: regular rate and rhythm, S1, S2 normal, no murmur, click, rub or gallop Abdomen: soft, non-tender; bowel sounds normal; no masses,  no organomegaly Rectal: deferred Extremities: extremities normal, atraumatic, no cyanosis or edema Pulses: 1+ bilaterally Skin: Skin color, texture, turgor normal. No rashes or lesions Neurologic: Grossly normal  EKG: Poor R wave progression, previous inferior infarction   IMPRESSIONS: 1. Appropriate defibrillator discharge for ventricular tachyarrhythmia 2. Coronary artery disease with ischemic cardiomyopathy 3. Hypertensive heart disease 4. History of reflux  PLAN: Review of labs done at outside hospital revealed no apparent cause for defibrillator discharge. He will be evaluated in the morning by Dr. [name] and associates. Keep n.p.o. in case they want to do further testing.  Signed: Darden Palmer MD  [facility]  03/12/2012, 9:31 PM

## 2012-03-13 ENCOUNTER — Encounter (HOSPITAL_COMMUNITY): Payer: Self-pay | Admitting: *Deleted

## 2012-03-13 DIAGNOSIS — I472 Ventricular tachycardia: Secondary | ICD-10-CM

## 2012-03-13 DIAGNOSIS — I2589 Other forms of chronic ischemic heart disease: Secondary | ICD-10-CM

## 2012-03-13 DIAGNOSIS — Z4502 Encounter for adjustment and management of automatic implantable cardiac defibrillator: Secondary | ICD-10-CM

## 2012-03-13 DIAGNOSIS — I2510 Atherosclerotic heart disease of native coronary artery without angina pectoris: Secondary | ICD-10-CM

## 2012-03-13 DIAGNOSIS — Z95810 Presence of automatic (implantable) cardiac defibrillator: Secondary | ICD-10-CM

## 2012-03-13 LAB — TROPONIN I: Troponin I: <0.3 ng/mL (ref <0.30)

## 2012-03-13 MED ORDER — METOPROLOL SUCCINATE ER 25 MG PO TB24
25.0000 mg | ORAL_TABLET | Freq: Every day | ORAL | Status: DC
  Administered 2012-03-13: 25 mg via ORAL
  Filled 2012-03-13: qty 1

## 2012-03-13 MED ORDER — METOPROLOL SUCCINATE ER 25 MG PO TB24: 25.0000 mg | ORAL_TABLET | Freq: Every day | ORAL | Status: DC

## 2012-03-13 NOTE — Progress Notes (Signed)
UR Completed Leandria Thier Graves-Bigelow, RN,BSN [phone]  

## 2012-03-13 NOTE — Progress Notes (Signed)
SUBJECTIVE: The patient is doing well today.  Denies further ICD shocks.  At this time, he denies chest pain, shortness of breath, or any new concerns.  He wants to go home.     Marland Kitchen aspirin EC  81 mg Oral Daily  . enoxaparin (LOVENOX) injection  40 mg Subcutaneous Q24H  . furosemide  20 mg Oral Daily  . LORazepam  0.5 mg Oral Q8H  . pantoprazole  40 mg Oral Daily  . potassium chloride SA  20 mEq Oral Daily  . sodium chloride  3 mL Intravenous Q12H  . sotalol  160 mg Oral Q12H  . [DISCONTINUED] aspirin  81 mg Oral Daily  . [DISCONTINUED] potassium chloride SA  20 mEq Oral Daily  . [DISCONTINUED] sotalol  160 mg Oral BID      OBJECTIVE: Physical Exam: Filed Vitals:   03/12/12 2000 03/13/12 0500  BP: 96/62 92/54  Pulse: 80 65  Temp: 97.7 F (36.5 C) 99 F (37.2 C)  Resp: 20 16  Height: 5\' 9"  (1.753 m)   Weight: 140 lb 12.8 oz (63.866 kg) 142 lb 9.6 oz (64.683 kg)  SpO2: 96% 92%    Intake/Output Summary (Last 24 hours) at 03/13/12 0859 Last data filed at 03/13/12 4098  Gross per 24 hour  Intake    300 ml  Output    300 ml  Net      0 ml    Telemetry reveals sinus rhythm with frequent PVCs  GEN- The patient is elderly appearing, alert and oriented x 3 today.   Head- normocephalic, atraumatic Eyes-  Sclera clear, conjunctiva pink Ears- hearing intact Oropharynx- clear Neck- supple,  Lungs- Clear to ausculation bilaterally, normal work of breathing Heart- Regular rate and rhythm with frequent ectopy, decreased HS  GI- soft, NT, ND, + BS Extremities- no clubbing, cyanosis, or edema Skin- no rash or lesion Psych- euthymic mood, full affect Neuro- strength and sensation are intact  LABS: Basic Metabolic Panel:  Basename 03/12/12 2150  NA 137  K 4.0  CL 101  CO2 25  GLUCOSE 87  BUN 11  CREATININE 0.89  CALCIUM 9.2  MG 2.1  PHOS --   Liver Function Tests:  Basename 03/12/12 2150  AST 24  ALT 10  ALKPHOS 94  BILITOT 0.6  PROT 6.7  ALBUMIN 3.2*    No results found for this basename: LIPASE:2,AMYLASE:2 in the last 72 hours CBC:  Basename 03/12/12 2150  WBC 6.2  NEUTROABS 3.8  HGB 12.2*  HCT 35.1*  MCV 91.6  PLT 186   Cardiac Enzymes:  Basename 03/13/12 0319 03/12/12 2149  CKTOTAL -- --  CKMB -- --  CKMBINDEX -- --  TROPONINI <0.30 <0.30   ASSESSMENT AND PLAN:  Active Problems:  Hypertensive heart disease  Coronary artery disease  Ventricular tachycardia  AUTOMATIC IMPLANTABLE CARDIAC DEFIBRILLATOR SITU  Ischemic cardiomyopathy  1. VT- pt with VT (CL 270s msec) successfully defibrillated with his ICD yesterday.  The episode occurred when he became agitated while walking his dog.  He denies any ischemic symptoms prior to the event or since.  ICD interrogation is reviewed my me and normal with above episode observed.  ATP was unsuccessfuly however shock was successful in terminating the event.  His QT appears stable on sotalol.  Electrolytes are normal and CMs are negative. In the absence of ischemic symptoms, I would not favor additional ischemic workup as an inpatient at this time.  Will add toprol 25mg  daily though with low BP,  we may be limited with additional medical therapy.  If he has future ICD shocks, then amiodarone would be a good alternative.  He does not drive.  2. CAD/ ischemic CM- as above  Will ambulate with cardiac rehab.  If stable, will DC later today.  Will need to follow-up with Dr [name] in 2 weeks.    Hillis Range, MD 03/13/2012 8:59 AM

## 2012-03-13 NOTE — Progress Notes (Signed)
Pt discharged to home per MD order.  Pt and family received and reviewed all discharge instructions and medication information including follow-up appointments and prescriptions.  Pt alert and oriented at discharge with no complaints of pain.  Pt IV removed prior to discharge with catheter tip intact.  Pt escorted to private vehicle via wheelchair by guest services.  George Acosta

## 2012-03-13 NOTE — Progress Notes (Signed)
CARDIAC REHAB PHASE I   PRE:  Rate/Rhythm: paced 67  BP:  Supine: 93/58  Sitting: 100/63  Standing:    SaO2: 97%RA  MODE:  Ambulation: 550 ft   POST:  Rate/Rhythem: 65  BP:  Supine:   Sitting: 95/42  Standing:    SaO2: 97%RA 0940-1020 Could not hear BP manually. Got dinamapp. BP as documented. Pt walked 550 ft with rolling walker with steady gait. Uses cane at home if needed. Only used walker since pt had not been OOB. Tolerated walk well. C/o feeling a little lightheaded during walk but not dizziness. To recliner after walk with call bell. Family in room. Notified pt's RN of low BP.  Duanne Limerick

## 2012-03-13 NOTE — Care Management Note (Signed)
    Page 1 of 1   03/13/2012     12:38:31 PM   CARE MANAGEMENT NOTE 03/13/2012  Patient:  DANTAVIUS, BOTTA A   Account Number:  [id number]  Date Initiated:  03/13/2012  Documentation initiated by:  GRAVES-BIGELOW,Tiffney Haughton  Subjective/Objective Assessment:   Pt admitted as trasnfer from [facility] due to VT- ICD interrogated at [facility] and plan for home today and f/u in EP clinic in 2 wks.     Action/Plan:   No needs from CM.   Anticipated DC Date:  03/13/2012   Anticipated DC Plan:  HOME/SELF CARE      DC Planning Services  CM consult      Choice offered to / List presented to:             Status of service:  Completed, signed off Medicare Important Message given?   (If response is "NO", the following Medicare IM given date fields will be blank) Date Medicare IM given:   Date Additional Medicare IM given:    Discharge Disposition:  HOME/SELF CARE  Per UR Regulation:  Reviewed for med. necessity/level of care/duration of stay  If discussed at Long Length of Stay Meetings, dates discussed:    Comments:

## 2012-03-13 NOTE — Discharge Summary (Signed)
Patient ID: George Acosta,  MRN: [id number], DOB/AGE: 1928/08/10 78 y.o.  Admit date: 03/12/2012 Discharge date: 03/13/2012  Primary Care Provider: [facility] Primary Cardiologist: G. [name], MD  Discharge Diagnoses Principal Problem:  *Ventricular tachycardia Active Problems:  Ischemic cardiomyopathy  Defibrillator discharge  Coronary artery disease  Hypertensive heart disease  GERD  Paroxysmal atrial fibrillation  Allergies Allergies  Allergen Reactions  . Sulfonamide Derivatives Hives   Procedures  ICD Interrogation 03/12/2012  Ventricular tachycardia with a cycle length of 270 msec->unsuccessful ATP->1 successful defibrillation.  History of Present Illness  78 y/o male with prior h/o CAD and ICM s/p ICD placement.  He was in his USOH until the day of admission, when he got up to walk his dog and was having trouble opening the door of his home.  He became agitated and then noted and ICD discharge.  This was neither preceded nor followed by chest pain, dyspnea, or presyncope/syncope.  He presented to [facility] where his device was interrogated and showed appropriate ICD shock for VT.  He was transferred to [facility] for further evaluation.  Hospital Course  Pt r/o for MI.  He was maintained on sotalol and placed on additional low-dose beta blocker therapy in the form of Toprol XL.  His electrolytes have been normal.  He has ambulated without difficulty and we plan to discharge him home today.  We will not pursue ischemic evaluation at this time secondary to the absence of symptoms and objective evidence of ischemia.  If he has recurrent VT, we will consider discontinuation of sotalol therapy and initiation of amiodarone.  He has f/u with Dr. [name] in EP clinic in roughly 2 weeks.  Discharge Vitals Blood pressure 95/42, pulse 66, temperature 99 F (37.2 C), resp. rate 16, height 5\' 9"  (1.753 m), weight 142 lb 9.6 oz (64.683 kg), SpO2 92.00%.  Filed Weights   03/12/12 2000 03/13/12 0500  Weight: 140 lb 12.8 oz (63.866 kg) 142 lb 9.6 oz (64.683 kg)   Labs  CBC  Basename 03/12/12 2150  WBC 6.2  NEUTROABS 3.8  HGB 12.2*  HCT 35.1*  MCV 91.6  PLT 186   Basic Metabolic Panel  Basename 03/12/12 2150  NA 137  K 4.0  CL 101  CO2 25  GLUCOSE 87  BUN 11  CREATININE 0.89  CALCIUM 9.2  MG 2.1  PHOS --   Liver Function Tests  Basename 03/12/12 2150  AST 24  ALT 10  ALKPHOS 94  BILITOT 0.6  PROT 6.7  ALBUMIN 3.2*   Cardiac Enzymes  Basename 03/13/12 0319 03/12/12 2149  CKTOTAL -- --  CKMB -- --  CKMBINDEX -- --  TROPONINI <0.30 <0.30   Disposition  Pt is being discharged home today in good condition.  Follow-up Plans & Appointments  Follow-up Information    Follow up with Lewayne Bunting, MD. On 03/21/2012. (2:45 PM)    Contact information:   [address] [place] [address] [phone]        Discharge Medications    Medication List     As of 03/13/2012 10:54 AM    TAKE these medications         aspirin 81 MG tablet   Take 81 mg by mouth daily.      furosemide 20 MG tablet   Commonly known as: LASIX   Take 20 mg by mouth daily.      LORazepam 0.5 MG tablet   Commonly known as: ATIVAN  Take 0.25 mg by mouth 2 (two) times daily.      metoprolol succinate 25 MG 24 hr tablet   Commonly known as: TOPROL-XL   Take 1 tablet (25 mg total) by mouth daily.      multivitamin with minerals Tabs   Take 1 tablet by mouth daily.      nitroGLYCERIN 0.4 MG SL tablet   Commonly known as: NITROSTAT   Place 0.4 mg under the tongue every 5 (five) minutes as needed. For chest pain      pantoprazole 40 MG tablet   Commonly known as: PROTONIX   Take 40 mg by mouth daily.      potassium chloride SA 20 MEQ tablet   Commonly known as: K-DUR,KLOR-CON   Take 20 mEq by mouth daily.      ranitidine 150 MG tablet   Commonly known as: ZANTAC   Take 150 mg by mouth at bedtime.      sotalol 160 MG  tablet   Commonly known as: BETAPACE   Take 1 tablet (160 mg total) by mouth 2 (two) times daily.      Tamsulosin HCl 0.4 MG Caps   Commonly known as: FLOMAX   Take 0.4 mg by mouth Daily.      Outstanding Labs/Studies  None  Duration of Discharge Encounter   Greater than 30 minutes including physician time.  Signed, Nicolasa Ducking NP 03/13/2012, 10:54 AM    Hillis Range, MD

## 2012-03-16 ENCOUNTER — Encounter: Payer: Self-pay | Admitting: *Deleted

## 2012-03-21 ENCOUNTER — Encounter: Payer: Self-pay | Admitting: Internal Medicine

## 2012-03-21 ENCOUNTER — Ambulatory Visit (INDEPENDENT_AMBULATORY_CARE_PROVIDER_SITE_OTHER): Payer: Medicare HMO | Admitting: Internal Medicine

## 2012-03-21 VITALS — BP 98/64 | HR 53 | Resp 18 | Ht 70.0 in | Wt 145.4 lb

## 2012-03-21 DIAGNOSIS — I2589 Other forms of chronic ischemic heart disease: Secondary | ICD-10-CM

## 2012-03-21 DIAGNOSIS — Z95810 Presence of automatic (implantable) cardiac defibrillator: Secondary | ICD-10-CM

## 2012-03-21 DIAGNOSIS — Z4502 Encounter for adjustment and management of automatic implantable cardiac defibrillator: Secondary | ICD-10-CM

## 2012-03-21 DIAGNOSIS — Z0389 Encounter for observation for other suspected diseases and conditions ruled out: Secondary | ICD-10-CM

## 2012-03-21 DIAGNOSIS — I255 Ischemic cardiomyopathy: Secondary | ICD-10-CM

## 2012-03-21 DIAGNOSIS — I472 Ventricular tachycardia: Secondary | ICD-10-CM

## 2012-03-21 LAB — ICD DEVICE OBSERVATION
AL AMPLITUDE: 1.1 mv
ATRIAL PACING ICD: 40 pct
DEVICE MODEL ICD: 102353
HV IMPEDENCE: 82 Ohm
RV LEAD IMPEDENCE ICD: 572 Ohm
TZAT-0001SLOWVT: 2
TZAT-0013FASTVT: 1
TZAT-0013SLOWVT: 3
TZAT-0018FASTVT: NEGATIVE
TZON-0003SLOWVT: 428.6 ms
TZST-0001FASTVT: 3
TZST-0001FASTVT: 4
TZST-0001FASTVT: 7
TZST-0001FASTVT: 8
TZST-0001SLOWVT: 6
TZST-0001SLOWVT: 7
TZST-0003FASTVT: 26 J
TZST-0003FASTVT: 41 J
TZST-0003FASTVT: 41 J
TZST-0003FASTVT: 41 J
TZST-0003SLOWVT: 26 J
TZST-0003SLOWVT: 41 J

## 2012-03-21 NOTE — Progress Notes (Signed)
HPI George Acosta returns today for followup. He is a very pleasant 78 year old man with chronic systolic heart failure, and ischemic cardiomyopathy, ventricular tachycardia, status post ICD implantation. The patient denies chest pain. He has chronic class II dyspnea. He received an ICD shock several weeks ago. He has had intermittent heart failure symptoms. He denies peripheral edema. Allergies  Allergen Reactions  . Sulfonamide Derivatives Hives     Current Outpatient Prescriptions  Medication Sig Dispense Refill  . aspirin 81 MG tablet Take 81 mg by mouth daily.        . furosemide (LASIX) 20 MG tablet Take 20 mg by mouth daily.       Marland Kitchen LORazepam (ATIVAN) 0.5 MG tablet Take 0.25 mg by mouth 2 (two) times daily.      . metoprolol succinate (TOPROL-XL) 25 MG 24 hr tablet Take 1 tablet (25 mg total) by mouth daily.  30 tablet  6  . Multiple Vitamin (MULTIVITAMIN WITH MINERALS) TABS Take 1 tablet by mouth daily.      . nitroGLYCERIN (NITROSTAT) 0.4 MG SL tablet Place 0.4 mg under the tongue every 5 (five) minutes as needed. For chest pain      . pantoprazole (PROTONIX) 40 MG tablet Take 40 mg by mouth daily.       . potassium chloride SA (K-DUR,KLOR-CON) 20 MEQ tablet Take 20 mEq by mouth daily.        . ranitidine (ZANTAC) 150 MG tablet Take 150 mg by mouth at bedtime.      . sotalol (BETAPACE) 160 MG tablet Take 1 tablet (160 mg total) by mouth 2 (two) times daily.  60 tablet  5  . Tamsulosin HCl (FLOMAX) 0.4 MG CAPS Take 0.4 mg by mouth Daily.         Past Medical History  Diagnosis Date  . Coronary atherosclerosis of unspecified type of vessel, native or graft   . Unspecified essential hypertension   . Benign prostatic hypertrophy   . Peptic ulcer disease   . Diverticulosis   . SOB (shortness of breath)   . GERD (gastroesophageal reflux disease)   . Ischemic cardiomyopathy   . Congestive heart failure, unspecified   . Arrhythmia     paroxysmal afib  . Ventricular tachycardia    A.   monomorphic VT  B. 04/2011 s/p generator change - [employer] ICD  [phone]  . MI (myocardial infarction) 1985    "big MI after I got to hospital"  . Acute myocardial infarction, unspecified site, episode of care unspecified 1985    "on way to hospital"  . Pneumonia 1980's  . Anxiety   . Pancreatitis 1980's  . Paroxysmal atrial fibrillation     ROS:   All systems reviewed and negative except as noted in the HPI.   Past Surgical History  Procedure Date  . Icd 2001; 2007; 04/28/11    generator change; complete replacement; lead replacement  . Laparotomy 11/2005    for small bowel obstruction,  . Cardiac catheterization 11/23/2005  . Cardiac defibrillator placement 1996  . Cholecystectomy 1980's  . Cataract extraction 2012    left eye     Family History  Problem Relation Age of Onset  . Heart disease Mother   . Heart disease Daughter      History   Social History  . Marital Status: Widowed    Spouse Name: N/A    Number of Children: N/A  . Years of Education: N/A   Occupational History  .  retired Naval architect    Social History Main Topics  . Smoking status: Former Smoker -- 2.0 packs/day for 40 years    Types: Cigarettes  . Smokeless tobacco: Former Neurosurgeon    Types: Chew, Snuff     Comment: quit smoking cigarettes 1985; quit smokeless tobacco ~ 2011  . Alcohol Use: No  . Drug Use: No  . Sexually Active: No   Other Topics Concern  . Not on file   Social History Narrative   The patient lives in [place]. He is widowed. 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 HEART:  Regular rate rhythm, no murmurs, no rubs, no  clicks Abd:  soft, positive bowel sounds, no organomegally, no rebound, no guarding Ext:  2 plus pulses, no edema, no cyanosis, no clubbing Skin:  No rashes no nodules Neuro:  CN II through XII intact, motor grossly intact  DEVICE  Normal device function.  See PaceArt for details.   Assess/Plan:

## 2012-03-21 NOTE — Assessment & Plan Note (Signed)
He's had one episode of ventricular tachycardia which did not terminate with anti-tachycardic pacing, but didn't terminate with ICD shock. No changes in medications at this time.

## 2012-03-21 NOTE — Patient Instructions (Signed)
Your physician wants you to follow-up in: 12 months with Dr Taylor You will receive a reminder letter in the mail two months in advance. If you don't receive a letter, please call our office to schedule the follow-up appointment.  Remote monitoring is used to monitor your Pacemaker of ICD from home. This monitoring reduces the number of office visits required to check your device to one time per year. It allows us to keep an eye on the functioning of your device to ensure it is working properly. You are scheduled for a device check from home on 06/29/12. You may send your transmission at any time that day. If you have a wireless device, the transmission will be sent automatically. After your physician reviews your transmission, you will receive a postcard with your next transmission date.   

## 2012-03-21 NOTE — Assessment & Plan Note (Signed)
His current device is working normally. We'll plan to recheck in several months.

## 2012-03-21 NOTE — Assessment & Plan Note (Signed)
He has chronic stable anginal symptoms, class II at worst. He is fairly sedentary. He will continue his current medical therapy.

## 2012-05-23 ENCOUNTER — Other Ambulatory Visit: Payer: Self-pay | Admitting: Internal Medicine

## 2012-06-29 ENCOUNTER — Ambulatory Visit (INDEPENDENT_AMBULATORY_CARE_PROVIDER_SITE_OTHER): Payer: Medicare HMO | Admitting: *Deleted

## 2012-06-29 ENCOUNTER — Other Ambulatory Visit: Payer: Self-pay | Admitting: Internal Medicine

## 2012-06-29 DIAGNOSIS — I2589 Other forms of chronic ischemic heart disease: Secondary | ICD-10-CM

## 2012-06-29 DIAGNOSIS — I255 Ischemic cardiomyopathy: Secondary | ICD-10-CM

## 2012-06-29 DIAGNOSIS — Z95810 Presence of automatic (implantable) cardiac defibrillator: Secondary | ICD-10-CM

## 2012-07-03 LAB — REMOTE ICD DEVICE
AL IMPEDENCE ICD: 532 Ohm
DEVICE MODEL ICD: 102353
HV IMPEDENCE: 73 Ohm
RV LEAD IMPEDENCE ICD: 550 Ohm
TZAT-0001FASTVT: 1
TZAT-0001SLOWVT: 1
TZAT-0013FASTVT: 1
TZAT-0018FASTVT: NEGATIVE
TZAT-0018SLOWVT: NEGATIVE
TZAT-0018SLOWVT: NEGATIVE
TZST-0001FASTVT: 3
TZST-0001FASTVT: 4
TZST-0001FASTVT: 6
TZST-0001SLOWVT: 3
TZST-0001SLOWVT: 6
TZST-0001SLOWVT: 7
TZST-0003FASTVT: 26 J
TZST-0003FASTVT: 41 J
TZST-0003FASTVT: 41 J
TZST-0003SLOWVT: 41 J
TZST-0003SLOWVT: 41 J

## 2012-07-07 ENCOUNTER — Encounter: Payer: Self-pay | Admitting: *Deleted

## 2012-07-31 ENCOUNTER — Encounter: Payer: Self-pay | Admitting: Internal Medicine

## 2012-09-28 ENCOUNTER — Ambulatory Visit (INDEPENDENT_AMBULATORY_CARE_PROVIDER_SITE_OTHER): Payer: Medicare HMO | Admitting: *Deleted

## 2012-09-28 DIAGNOSIS — Z95810 Presence of automatic (implantable) cardiac defibrillator: Secondary | ICD-10-CM

## 2012-09-28 DIAGNOSIS — I2589 Other forms of chronic ischemic heart disease: Secondary | ICD-10-CM

## 2012-09-28 DIAGNOSIS — I255 Ischemic cardiomyopathy: Secondary | ICD-10-CM

## 2012-10-05 ENCOUNTER — Encounter: Payer: Self-pay | Admitting: Internal Medicine

## 2012-10-16 LAB — REMOTE ICD DEVICE
DEVICE MODEL ICD: 102353
FVT: 0
HV IMPEDENCE: 84 Ohm
PACEART VT: 0
RV LEAD IMPEDENCE ICD: 576 Ohm
TZAT-0001FASTVT: 1
TZAT-0001SLOWVT: 1
TZAT-0002FASTVT: NEGATIVE
TZAT-0013SLOWVT: 3
TZAT-0013SLOWVT: 3
TZAT-0018FASTVT: NEGATIVE
TZAT-0018SLOWVT: NEGATIVE
TZON-0003FASTVT: 315.8 ms
TZON-0003SLOWVT: 428.6 ms
TZST-0001FASTVT: 5
TZST-0001FASTVT: 6
TZST-0001SLOWVT: 3
TZST-0001SLOWVT: 6
TZST-0003FASTVT: 26 J
TZST-0003FASTVT: 41 J
TZST-0003SLOWVT: 41 J
TZST-0003SLOWVT: 41 J
VF: 0

## 2012-10-18 ENCOUNTER — Other Ambulatory Visit: Payer: Self-pay | Admitting: *Deleted

## 2012-10-18 MED ORDER — METOPROLOL SUCCINATE ER 25 MG PO TB24: 25.0000 mg | ORAL_TABLET | Freq: Every day | ORAL | Status: DC

## 2012-11-17 ENCOUNTER — Other Ambulatory Visit: Payer: Self-pay | Admitting: Internal Medicine

## 2013-01-04 ENCOUNTER — Ambulatory Visit (INDEPENDENT_AMBULATORY_CARE_PROVIDER_SITE_OTHER): Payer: Medicare HMO | Admitting: *Deleted

## 2013-01-04 DIAGNOSIS — Z95810 Presence of automatic (implantable) cardiac defibrillator: Secondary | ICD-10-CM

## 2013-01-04 DIAGNOSIS — Z4502 Encounter for adjustment and management of automatic implantable cardiac defibrillator: Secondary | ICD-10-CM

## 2013-01-04 DIAGNOSIS — Z0389 Encounter for observation for other suspected diseases and conditions ruled out: Secondary | ICD-10-CM

## 2013-01-08 LAB — REMOTE ICD DEVICE
AL AMPLITUDE: 0.6 mv
CHARGE TIME: 9.1 s
DEV-0020ICD: NEGATIVE
RV LEAD AMPLITUDE: 13.2 mv
TZAT-0001FASTVT: 1
TZAT-0001SLOWVT: 2
TZAT-0002FASTVT: NEGATIVE
TZAT-0013SLOWVT: 3
TZAT-0018FASTVT: NEGATIVE
TZAT-0018SLOWVT: NEGATIVE
TZON-0003SLOWVT: 428.6 ms
TZST-0001FASTVT: 3
TZST-0001FASTVT: 5
TZST-0001FASTVT: 8
TZST-0001SLOWVT: 5
TZST-0001SLOWVT: 6
TZST-0001SLOWVT: 7
TZST-0003FASTVT: 41 J
TZST-0003FASTVT: 41 J
TZST-0003FASTVT: 41 J
TZST-0003FASTVT: 41 J
TZST-0003SLOWVT: 41 J
TZST-0003SLOWVT: 41 J

## 2013-01-09 ENCOUNTER — Encounter: Payer: Self-pay | Admitting: *Deleted

## 2013-01-13 ENCOUNTER — Encounter: Payer: Self-pay | Admitting: Internal Medicine

## 2013-01-17 ENCOUNTER — Other Ambulatory Visit: Payer: Self-pay

## 2013-01-17 MED ORDER — METOPROLOL SUCCINATE ER 25 MG PO TB24: 25.0000 mg | ORAL_TABLET | Freq: Every day | ORAL | Status: DC

## 2013-03-16 ENCOUNTER — Encounter: Payer: Self-pay | Admitting: Internal Medicine

## 2013-03-16 ENCOUNTER — Ambulatory Visit (INDEPENDENT_AMBULATORY_CARE_PROVIDER_SITE_OTHER): Payer: Medicare HMO | Admitting: Internal Medicine

## 2013-03-16 VITALS — BP 105/59 | HR 56 | Ht 70.0 in | Wt 152.0 lb

## 2013-03-16 DIAGNOSIS — Z95810 Presence of automatic (implantable) cardiac defibrillator: Secondary | ICD-10-CM

## 2013-03-16 DIAGNOSIS — I472 Ventricular tachycardia: Secondary | ICD-10-CM

## 2013-03-16 DIAGNOSIS — Z23 Encounter for immunization: Secondary | ICD-10-CM

## 2013-03-16 DIAGNOSIS — I255 Ischemic cardiomyopathy: Secondary | ICD-10-CM

## 2013-03-16 DIAGNOSIS — I2589 Other forms of chronic ischemic heart disease: Secondary | ICD-10-CM

## 2013-03-16 DIAGNOSIS — I480 Paroxysmal atrial fibrillation: Secondary | ICD-10-CM

## 2013-03-16 DIAGNOSIS — I4891 Unspecified atrial fibrillation: Secondary | ICD-10-CM

## 2013-03-16 LAB — MDC_IDC_ENUM_SESS_TYPE_INCLINIC
Date Time Interrogation Session: 20141205050000
HighPow Impedance: 84 Ohm
Implantable Pulse Generator Serial Number: 102353
Lead Channel Impedance Value: 512 Ohm
Lead Channel Impedance Value: 555 Ohm
Lead Channel Pacing Threshold Amplitude: 0.7 V
Lead Channel Pacing Threshold Pulse Width: 0.4 ms
Lead Channel Pacing Threshold Pulse Width: 0.4 ms
Lead Channel Setting Pacing Pulse Width: 0.4 ms
Lead Channel Setting Sensing Sensitivity: 0.5 mV
Zone Setting Detection Interval: 273 ms
Zone Setting Detection Interval: 316 ms
Zone Setting Detection Interval: 429 ms

## 2013-03-16 MED ORDER — NITROGLYCERIN 0.4 MG SL SUBL: 0.4000 mg | SUBLINGUAL_TABLET | SUBLINGUAL | Status: DC | PRN

## 2013-03-16 NOTE — Patient Instructions (Signed)
Remote monitoring is used to monitor your ICD from home. This monitoring reduces the number of office visits required to check your device to one time per year. It allows [place] to keep an eye on the functioning of your device to ensure it is working properly. You are scheduled for a device check from home on 06-18-2013. You may send your transmission at any time that day. If you have a wireless device, the transmission will be sent automatically. After your physician reviews your transmission, you will receive a postcard with your next transmission date.  Your physician recommends that you schedule a follow-up appointment in: 12 months with Dr.Taylor

## 2013-03-16 NOTE — Assessment & Plan Note (Signed)
He has had no sustained ventricular arrhythmias since last ICD check. Continue current medical therapy.

## 2013-03-16 NOTE — Progress Notes (Signed)
HPI George Acosta returns today for followup. He is a very pleasant 79 year old man with chronic systolic heart failure, and ischemic cardiomyopathy, ventricular tachycardia, status post ICD implantation. The patient denies chest pain. He has chronic class II dyspnea. He has had intermittent heart failure symptoms. He denies peripheral edema.  He notes mild nasal congestion but no fevers or chills. Allergies  Allergen Reactions  . Sulfonamide Derivatives Hives     Current Outpatient Prescriptions  Medication Sig Dispense Refill  . aspirin 81 MG tablet Take 81 mg by mouth daily.        . furosemide (LASIX) 20 MG tablet Take 20 mg by mouth daily.       Marland Kitchen LORazepam (ATIVAN) 0.5 MG tablet Take 0.25 mg by mouth 2 (two) times daily.      . metoprolol succinate (TOPROL-XL) 25 MG 24 hr tablet Take 1 tablet (25 mg total) by mouth daily.  30 tablet  4  . Multiple Vitamin (MULTIVITAMIN WITH MINERALS) TABS Take 1 tablet by mouth daily.      . nitroGLYCERIN (NITROSTAT) 0.4 MG SL tablet Place 1 tablet (0.4 mg total) under the tongue every 5 (five) minutes as needed. For chest pain  30 tablet  11  . pantoprazole (PROTONIX) 40 MG tablet Take 40 mg by mouth daily.       . potassium chloride SA (K-DUR,KLOR-CON) 20 MEQ tablet Take 20 mEq by mouth daily.        . ranitidine (ZANTAC) 150 MG tablet Take 150 mg by mouth at bedtime.      . sotalol (BETAPACE) 80 MG tablet TAKE 2 TABLETS TWICE DAILY.  120 tablet  5  . Tamsulosin HCl (FLOMAX) 0.4 MG CAPS Take 0.4 mg by mouth Daily.       No current facility-administered medications for this visit.     Past Medical History  Diagnosis Date  . Coronary atherosclerosis of unspecified type of vessel, native or graft   . Unspecified essential hypertension   . Benign prostatic hypertrophy   . Peptic ulcer disease   . Diverticulosis   . SOB (shortness of breath)   . GERD (gastroesophageal reflux disease)   . Ischemic cardiomyopathy   . Congestive heart failure,  unspecified   . Arrhythmia     paroxysmal afib  . Ventricular tachycardia     A.   monomorphic VT  B. 04/2011 s/p generator change - [employer] ICD  [phone]  . MI (myocardial infarction) 1985    "big MI after I got to hospital"  . Acute myocardial infarction, unspecified site, episode of care unspecified 1985    "on way to hospital"  . Pneumonia 1980's  . Anxiety   . Pancreatitis 1980's  . Paroxysmal atrial fibrillation     ROS:   All systems reviewed and negative except as noted in the HPI.   Past Surgical History  Procedure Laterality Date  . Icd  2001; 2007; 04/28/11    generator change; complete replacement; lead replacement  . Laparotomy  11/2005    for small bowel obstruction,  . Cardiac catheterization  11/23/2005  . Cardiac defibrillator placement  1996  . Cholecystectomy  1980's  . Cataract extraction  2012    left eye     Family History  Problem Relation Age of Onset  . Heart disease Mother   . Heart disease Daughter      History   Social History  . Marital Status: Widowed    Spouse Name: N/A  Number of Children: N/A  . Years of Education: N/A   Occupational History  . retired Naval architect    Social History Main Topics  . Smoking status: Former Smoker -- 2.00 packs/day for 40 years    Types: Cigarettes  . Smokeless tobacco: Former Neurosurgeon    Types: Chew, Snuff     Comment: quit smoking cigarettes 1985; quit smokeless tobacco ~ 2011  . Alcohol Use: No  . Drug Use: No  . Sexual Activity: No   Other Topics Concern  . Not on file   Social History Narrative   The patient lives in [place]. He is widowed. 2 boys 2girls   He is a  retired Naval architect   Patient is former smoker no tobacco use   Caffeine use - 3 cups     BP 105/59  Pulse 56  Ht 5\' 10"  (1.778 m)  Wt 152 lb (68.947 kg)  BMI 21.81 kg/m2  Physical Exam:  Well appearing elderly man, NAD HEENT: Unremarkable Neck:  7 cm JVD, no thyromegally Lungs:  Clear with  no wheezes, rales, or rhonchi. HEART:  Regular rate rhythm, no murmurs, no rubs, no clicks Abd:  soft, positive bowel sounds, no organomegally, no rebound, no guarding Ext:  2 plus pulses, no edema, no cyanosis, no clubbing Skin:  No rashes no nodules Neuro:  CN II through XII intact, motor grossly intact  DEVICE  Normal device function.  See PaceArt for details.   Assess/Plan:

## 2013-03-16 NOTE — Assessment & Plan Note (Signed)
His [employer] ICD is working normally. We'll plan to recheck in several months. 

## 2013-03-16 NOTE — Assessment & Plan Note (Signed)
He denies anginal symptoms. No change in medical therapy. 

## 2013-05-16 ENCOUNTER — Other Ambulatory Visit: Payer: Self-pay | Admitting: Internal Medicine

## 2013-06-18 ENCOUNTER — Ambulatory Visit: Payer: Medicare HMO | Admitting: *Deleted

## 2013-06-18 DIAGNOSIS — I428 Other cardiomyopathies: Secondary | ICD-10-CM

## 2013-06-18 LAB — MDC_IDC_ENUM_SESS_TYPE_REMOTE
Date Time Interrogation Session: 20150309144200
HIGH POWER IMPEDANCE MEASURED VALUE: 81 Ohm
Implantable Pulse Generator Serial Number: 102353
Lead Channel Impedance Value: 512 Ohm
Lead Channel Impedance Value: 513 Ohm
Lead Channel Sensing Intrinsic Amplitude: 1.3 mV
Lead Channel Setting Pacing Amplitude: 2 V
MDC IDC MSMT LEADCHNL RV SENSING INTR AMPL: 12.6 mV
MDC IDC SET LEADCHNL RV PACING AMPLITUDE: 2.4 V
MDC IDC SET LEADCHNL RV PACING PULSEWIDTH: 0.4 ms
MDC IDC SET ZONE DETECTION INTERVAL: 273 ms
MDC IDC STAT BRADY RA PERCENT PACED: 21 %
MDC IDC STAT BRADY RV PERCENT PACED: 2 %
Zone Setting Detection Interval: 316 ms
Zone Setting Detection Interval: 429 ms

## 2013-06-25 ENCOUNTER — Other Ambulatory Visit: Payer: Self-pay | Admitting: Internal Medicine

## 2013-07-03 ENCOUNTER — Encounter: Payer: Self-pay | Admitting: *Deleted

## 2013-07-06 ENCOUNTER — Telehealth: Payer: Self-pay | Admitting: Internal Medicine

## 2013-07-06 NOTE — Telephone Encounter (Signed)
New message          Pt would like to know if you received his transmission for 3/9. Please call pt about letter sent to sched appt. Dr Lovena Le is recalled for December 2015.

## 2013-07-09 NOTE — Telephone Encounter (Signed)
LMOM to let know transmission was received.

## 2013-08-24 ENCOUNTER — Ambulatory Visit: Payer: Medicare HMO | Admitting: *Deleted

## 2013-08-24 DIAGNOSIS — I480 Paroxysmal atrial fibrillation: Secondary | ICD-10-CM

## 2013-08-24 DIAGNOSIS — I4729 Other ventricular tachycardia: Secondary | ICD-10-CM

## 2013-08-24 DIAGNOSIS — I472 Ventricular tachycardia: Secondary | ICD-10-CM

## 2013-08-24 NOTE — Progress Notes (Signed)
Remote ICD transmission.   

## 2013-09-20 ENCOUNTER — Ambulatory Visit (INDEPENDENT_AMBULATORY_CARE_PROVIDER_SITE_OTHER): Payer: Medicare HMO | Admitting: *Deleted

## 2013-09-20 DIAGNOSIS — I480 Paroxysmal atrial fibrillation: Secondary | ICD-10-CM

## 2013-09-20 DIAGNOSIS — I4729 Other ventricular tachycardia: Secondary | ICD-10-CM

## 2013-09-20 DIAGNOSIS — I472 Ventricular tachycardia: Secondary | ICD-10-CM

## 2013-09-20 DIAGNOSIS — I4891 Unspecified atrial fibrillation: Secondary | ICD-10-CM

## 2013-09-20 NOTE — Progress Notes (Signed)
Remote ICD transmission.   

## 2013-09-20 NOTE — Progress Notes (Deleted)
Remote ICD transmission.   

## 2013-10-02 LAB — MDC_IDC_ENUM_SESS_TYPE_REMOTE
Battery Remaining Longevity: 10.5
Brady Statistic RA Percent Paced: 22 %
HighPow Impedance: 89 Ohm
Lead Channel Sensing Intrinsic Amplitude: 1.1 mV
Lead Channel Setting Pacing Amplitude: 2.4 V
MDC IDC MSMT LEADCHNL RA IMPEDANCE VALUE: 538 Ohm
MDC IDC MSMT LEADCHNL RV IMPEDANCE VALUE: 572 Ohm
MDC IDC MSMT LEADCHNL RV SENSING INTR AMPL: 13.3 mV
MDC IDC PG SERIAL: 102353
MDC IDC SET LEADCHNL RA PACING AMPLITUDE: 2 V
MDC IDC SET LEADCHNL RV PACING PULSEWIDTH: 0.4 ms
MDC IDC SET ZONE DETECTION INTERVAL: 429 ms
MDC IDC STAT BRADY RV PERCENT PACED: 0 %
Zone Setting Detection Interval: 273 ms
Zone Setting Detection Interval: 316 ms

## 2013-10-17 ENCOUNTER — Encounter: Payer: Self-pay | Admitting: Cardiology

## 2013-10-22 ENCOUNTER — Encounter: Payer: Self-pay | Admitting: Internal Medicine

## 2013-11-21 ENCOUNTER — Other Ambulatory Visit: Payer: Self-pay | Admitting: Internal Medicine

## 2013-11-23 ENCOUNTER — Telehealth: Payer: Self-pay | Admitting: Internal Medicine

## 2013-11-23 NOTE — Telephone Encounter (Signed)
Spoke with Angela Nevin at [facility] and advised her that Dr. Lovena Le and his primary nurse, Janan Halter, RN are not in the office today and will return next week.  Angela Nevin asked me to please note that patient was hospitalized at [facility] for pneumonia 7/16 through 7/23.  She states patient has been off Metoprolol due to hypotension and they would like a recent ICD transmission.  I advised Angela Nevin that I am noting this in the message and Claiborne Billings will f/u with her next week.  Angela Nevin thanked me for the call.

## 2013-11-23 NOTE — Telephone Encounter (Signed)
New problem   Pt is having a excision of mass 12/11/13 and need clearance. Please advise.

## 2013-11-27 ENCOUNTER — Telehealth: Payer: Self-pay | Admitting: Internal Medicine

## 2013-11-27 NOTE — Telephone Encounter (Signed)
New message          Fax number [phone] / ICD-9 Report

## 2013-11-27 NOTE — Telephone Encounter (Signed)
LMOVM for George Acosta to return call/kwm

## 2013-11-27 NOTE — Telephone Encounter (Signed)
Device checks faxed to Carla/kwm

## 2013-11-27 NOTE — Telephone Encounter (Signed)
Device checks faxed to [phone]/YBO

## 2013-11-30 ENCOUNTER — Other Ambulatory Visit: Payer: Self-pay | Admitting: Internal Medicine

## 2013-12-24 ENCOUNTER — Ambulatory Visit (INDEPENDENT_AMBULATORY_CARE_PROVIDER_SITE_OTHER): Payer: Medicare HMO | Admitting: *Deleted

## 2013-12-24 DIAGNOSIS — I4729 Other ventricular tachycardia: Secondary | ICD-10-CM

## 2013-12-24 DIAGNOSIS — I472 Ventricular tachycardia: Secondary | ICD-10-CM

## 2013-12-24 LAB — MDC_IDC_ENUM_SESS_TYPE_REMOTE
HighPow Impedance: 68 Ohm
Lead Channel Impedance Value: 518 Ohm
Lead Channel Sensing Intrinsic Amplitude: 1.1 mV
Lead Channel Setting Pacing Amplitude: 2.4 V
Lead Channel Setting Sensing Sensitivity: 0.5 mV
MDC IDC MSMT LEADCHNL RV IMPEDANCE VALUE: 490 Ohm
MDC IDC MSMT LEADCHNL RV SENSING INTR AMPL: 13 mV
MDC IDC PG SERIAL: 102353
MDC IDC SET LEADCHNL RA PACING AMPLITUDE: 2 V
MDC IDC SET LEADCHNL RV PACING PULSEWIDTH: 0.4 ms
MDC IDC SET ZONE DETECTION INTERVAL: 273 ms
MDC IDC SET ZONE DETECTION INTERVAL: 429 ms
MDC IDC STAT BRADY RA PERCENT PACED: 15 %
MDC IDC STAT BRADY RV PERCENT PACED: 1 %
Zone Setting Detection Interval: 316 ms

## 2013-12-24 NOTE — Progress Notes (Signed)
Remote ICD transmission.   

## 2014-01-01 ENCOUNTER — Encounter: Payer: Self-pay | Admitting: Cardiology

## 2014-01-02 ENCOUNTER — Encounter: Payer: Self-pay | Admitting: Internal Medicine

## 2014-01-15 ENCOUNTER — Encounter: Payer: Self-pay | Admitting: Critical Care Medicine

## 2014-01-15 ENCOUNTER — Ambulatory Visit (INDEPENDENT_AMBULATORY_CARE_PROVIDER_SITE_OTHER): Payer: Medicare HMO | Admitting: Critical Care Medicine

## 2014-01-15 VITALS — BP 92/60 | HR 56 | Temp 96.2°F | Ht 69.0 in | Wt 142.0 lb

## 2014-01-15 DIAGNOSIS — J432 Centrilobular emphysema: Secondary | ICD-10-CM

## 2014-01-15 DIAGNOSIS — R918 Other nonspecific abnormal finding of lung field: Secondary | ICD-10-CM

## 2014-01-15 NOTE — Patient Instructions (Signed)
A PET scan will be scheduled We will call results and next steps

## 2014-01-15 NOTE — Progress Notes (Signed)
Subjective:    Patient ID: George Acosta, male    DOB: [birth year], 80 y.o.   MRN: [id number]  HPI 01/15/2014 Chief Complaint  Patient presents with  . Pulmonary Consult    Lung mass-ref. by Dr. Grace Isaac [facility]. 10/2013.no sob,cough-yellow and white,denies cp or tightness,no wheezing  This patient is referred for evaluation of right upper lobe lung mass. The patient was in the hospital with diagnosed pneumonia in July 2015. During this hospitalization a CT scan was performed and demonstrated a right upper lobe lung mass 1.1 x 1.2 cm in diameter with irregular borders. The patient received a course of antibiotics and a followup CT scan in August 2015 revealed persistent right upper lobe lesion. The patient's level of dyspnea is improved. There is minimal cough. There is no chest pain. There is no hemoptysis. No weight loss. Is no fever chills or sweats. Is occasional dizziness. The patient's not had frequent falls. Is not on oxygen therapy. He has never been diagnosed recently and treated for chronic lung disease. The patient is an ex-smoker.  The patient smoked 80 pack years quitting in 1985  Past Medical History  Diagnosis Date  . Coronary atherosclerosis of unspecified type of vessel, native or graft   . Unspecified essential hypertension   . Benign prostatic hypertrophy   . Peptic ulcer disease   . Diverticulosis   . SOB (shortness of breath)   . GERD (gastroesophageal reflux disease)   . Ischemic cardiomyopathy   . Congestive heart failure, unspecified   . Arrhythmia     paroxysmal afib  . Ventricular tachycardia     A.   monomorphic VT  B. 04/2011 s/p generator change - [employer] ICD  [phone]  . MI (myocardial infarction) 1985    "big MI after I got to hospital"  . Acute myocardial infarction, unspecified site, episode of care unspecified 1985    "on way to hospital"  . Pneumonia 1980's  . Anxiety   . Pancreatitis 1980's  . Paroxysmal atrial  fibrillation      Family History  Problem Relation Age of Onset  . Heart disease Mother   . Heart disease Daughter   . Cancer Daughter      History   Social History  . Marital Status: Widowed    Spouse Name: N/A    Number of Children: N/A  . Years of Education: N/A   Occupational History  . retired Administrator    Social History Main Topics  . Smoking status: Former Smoker -- 2.00 packs/day for 40 years    Types: Cigarettes    Quit date: 04/18/1983  . Smokeless tobacco: Former Systems developer    Types: Snuff, Chew     Comment: quit smoking cigarettes 1985; quit smokeless tobacco ~ 2011  . Alcohol Use: No  . Drug Use: No  . Sexual Activity: No   Other Topics Concern  . Not on file   Social History Narrative   The patient lives in [place]. He is widowed. 2 boys 2girls   He is a  retired Administrator   Patient is former smoker no tobacco use   Caffeine use - 3 cups     Allergies  Allergen Reactions  . Sulfonamide Derivatives Hives     Outpatient Prescriptions Prior to Visit  Medication Sig Dispense Refill  . furosemide (LASIX) 20 MG tablet Take 20 mg by mouth daily.       Marland Kitchen LORazepam (ATIVAN) 0.5 MG tablet Take 0.25  mg by mouth 2 (two) times daily.      . Multiple Vitamin (MULTIVITAMIN WITH MINERALS) TABS Take 1 tablet by mouth daily.      . nitroGLYCERIN (NITROSTAT) 0.4 MG SL tablet Place 1 tablet (0.4 mg total) under the tongue every 5 (five) minutes as needed. For chest pain  30 tablet  11  . pantoprazole (PROTONIX) 40 MG tablet Take 40 mg by mouth daily.       . potassium chloride SA (K-DUR,KLOR-CON) 20 MEQ tablet Take 20 mEq by mouth daily.        . ranitidine (ZANTAC) 150 MG tablet Take 150 mg by mouth. Take 1/2 tablet at bedtime by mouth.      . sotalol (BETAPACE) 80 MG tablet TAKE 2 TABLETS TWICE DAILY.  120 tablet  3  . Tamsulosin HCl (FLOMAX) 0.4 MG CAPS Take 0.4 mg by mouth Daily.      Marland Kitchen aspirin 81 MG tablet Take 81 mg by mouth daily.        . metoprolol  succinate (TOPROL-XL) 25 MG 24 hr tablet TAKE 1 TABLET ONCE DAILY.  30 tablet  3   No facility-administered medications prior to visit.   Review of Systems Constitutional:   No  weight loss, night sweats,  Fevers, chills, fatigue, lassitude. HEENT:   No headaches,  Difficulty swallowing,  Tooth/dental problems,  Sore throat,                No sneezing, itching, ear ache, nasal congestion, post nasal drip,   CV:  No chest pain,  Orthopnea, PND, swelling in lower extremities, anasarca, dizziness, palpitations  GI  No heartburn, indigestion, abdominal pain, nausea, vomiting, diarrhea, change in bowel habits, loss of appetite  Resp: Notes shortness of breath with exertion not  at rest.  No excess mucus, no productive cough,  No non-productive cough,  No coughing up of blood.  No change in color of mucus.  No wheezing.  No chest wall deformity  Skin: no rash or lesions.  GU: no dysuria, change in color of urine, no urgency or frequency.  No flank pain.  MS:  No joint pain or swelling.  No decreased range of motion.  No back pain.  Psych:  No change in mood or affect. No depression or anxiety.  No memory loss.     Objective:   Physical Exam Filed Vitals:   01/15/14 1138  BP: 92/60  Pulse: 56  Temp: 96.2 F (35.7 C)  TempSrc: Oral  Height: 5\' 9"  (1.753 m)  SpO2: 97%    Gen: Pleasant, well-nourished, in no distress,  normal affect  ENT: No lesions,  mouth clear,  oropharynx clear, no postnasal drip  Neck: No JVD, no TMG, no carotid bruits  Lungs: No use of accessory muscles, no dullness to percussion, distant breath sounds  Cardiovascular: RRR, heart sounds normal, no murmur or gallops, no peripheral edema  Abdomen: soft and NT, no HSM,  BS normal  Musculoskeletal: No deformities, no cyanosis or clubbing  Neuro: alert, non focal  Skin: Warm, no lesions or rashes  No results found.  CT Chest 11/2013: Irregular right upper lobe 1.1 x 1.2 cm mass reidentified image  8, not significantly changed when allowing for differences in measurement technique and slice selection. Mild central bronchial wall thickening is evident. There are areas of subpleural tree-in-bud type nodular airspace opacity bilaterally. Curvilinear left lower lobe atelectasis has improved. Emphysematous changes are reidentified.     Assessment & Plan:  Lung mass RUL 1.1x 1.2 cm  Right upper lobe solid mass with irregular border 1.1 x 1.2 cm in diameter. Likely represents malignancy. No significant mediastinal or hilar nodes seen. No other nodules are seen. There has been no change in this when compared to prior scans in July 2015. No other imaging seen prior this date. At this point approaches could include an navigational bronchoscopy versus transthoracic needle biopsy. This patient likely does not have sufficient pulmonary reserve to undergo resection but may benefit from stereotactic radiation to the right upper lobe. Plan We'll obtain a PET scan first and from there determine whether navigational bronchoscopy versus needle biopsy is warranted.  COPD with emphysema Chronic obstructive lung disease with emphysematous component. Most of the emphysema seen is in the upper lobes lung zones. At this point the patient is relatively asymptomatic a pulmonary perspective therefore no inhaled medications have been prescribed. If resection of the lesion is consider pulmonary function testing will be warranted   Updated Medication List Outpatient Encounter Prescriptions as of 01/15/2014  Medication Sig  . aspirin 325 MG tablet Take 325 mg by mouth daily.  . furosemide (LASIX) 20 MG tablet Take 20 mg by mouth daily.   Marland Kitchen loratadine (CLARITIN) 10 MG tablet Take 10 mg by mouth daily.  Marland Kitchen LORazepam (ATIVAN) 0.5 MG tablet Take 0.25 mg by mouth 2 (two) times daily.  . Multiple Vitamin (MULTIVITAMIN WITH MINERALS) TABS Take 1 tablet by mouth daily.  . nitroGLYCERIN (NITROSTAT) 0.4 MG SL tablet Place  1 tablet (0.4 mg total) under the tongue every 5 (five) minutes as needed. For chest pain  . pantoprazole (PROTONIX) 40 MG tablet Take 40 mg by mouth daily.   . potassium chloride SA (K-DUR,KLOR-CON) 20 MEQ tablet Take 20 mEq by mouth daily.    . ranitidine (ZANTAC) 150 MG tablet Take 150 mg by mouth. Take 1/2 tablet at bedtime by mouth.  . sotalol (BETAPACE) 80 MG tablet TAKE 2 TABLETS TWICE DAILY.  . Tamsulosin HCl (FLOMAX) 0.4 MG CAPS Take 0.4 mg by mouth Daily.  . [DISCONTINUED] aspirin 81 MG tablet Take 81 mg by mouth daily.    . [DISCONTINUED] metoprolol succinate (TOPROL-XL) 25 MG 24 hr tablet TAKE 1 TABLET ONCE DAILY.

## 2014-01-16 DIAGNOSIS — J439 Emphysema, unspecified: Secondary | ICD-10-CM | POA: Insufficient documentation

## 2014-01-16 DIAGNOSIS — R918 Other nonspecific abnormal finding of lung field: Secondary | ICD-10-CM | POA: Insufficient documentation

## 2014-01-16 NOTE — Assessment & Plan Note (Signed)
Chronic obstructive lung disease with emphysematous component. Most of the emphysema seen is in the upper lobes lung zones. At this point the patient is relatively asymptomatic a pulmonary perspective therefore no inhaled medications have been prescribed. If resection of the lesion is consider pulmonary function testing will be warranted

## 2014-01-16 NOTE — Assessment & Plan Note (Signed)
Right upper lobe solid mass with irregular border 1.1 x 1.2 cm in diameter. Likely represents malignancy. No significant mediastinal or hilar nodes seen. No other nodules are seen. There has been no change in this when compared to prior scans in July 2015. No other imaging seen prior this date. At this point approaches could include an navigational bronchoscopy versus transthoracic needle biopsy. This patient likely does not have sufficient pulmonary reserve to undergo resection but may benefit from stereotactic radiation to the right upper lobe. Plan We'll obtain a PET scan first and from there determine whether navigational bronchoscopy versus needle biopsy is warranted.

## 2014-02-13 ENCOUNTER — Telehealth: Payer: Self-pay | Admitting: Critical Care Medicine

## 2014-02-14 NOTE — Telephone Encounter (Signed)
Spoke with [name] pt daughter. She reports pt does not remember what all Dr. Joya Gaskins had stated regarding his results. She is aware PW not in until Monday. Please advise thanks

## 2014-02-14 NOTE — Telephone Encounter (Signed)
i spoke to the pt daughter and she has a clear understanding now

## 2014-02-14 NOTE — Telephone Encounter (Signed)
Patient's daughter, [name], states her father did not understand the results of PET scan.  [name] is asking if this can be relayed to her.  [phone]

## 2014-02-15 ENCOUNTER — Other Ambulatory Visit: Payer: Self-pay | Admitting: Emergency Medicine

## 2014-02-15 DIAGNOSIS — R911 Solitary pulmonary nodule: Secondary | ICD-10-CM

## 2014-02-20 ENCOUNTER — Ambulatory Visit (INDEPENDENT_AMBULATORY_CARE_PROVIDER_SITE_OTHER)
Admission: RE | Admit: 2014-02-20 | Discharge: 2014-02-20 | Disposition: A | Discharge status: Home / Self Care | Payer: Medicare HMO | Source: Ambulatory Visit | Attending: Emergency Medicine | Admitting: Emergency Medicine

## 2014-02-20 DIAGNOSIS — R911 Solitary pulmonary nodule: Secondary | ICD-10-CM

## 2014-02-22 ENCOUNTER — Encounter (HOSPITAL_COMMUNITY)
Admission: RE | Admit: 2014-02-22 | Discharge: 2014-02-22 | Disposition: A | Discharge status: Home / Self Care | Payer: Medicare HMO | Source: Ambulatory Visit | Attending: Emergency Medicine | Admitting: Emergency Medicine

## 2014-02-22 ENCOUNTER — Encounter (HOSPITAL_COMMUNITY): Payer: Self-pay

## 2014-02-22 ENCOUNTER — Other Ambulatory Visit: Payer: Self-pay

## 2014-02-22 ENCOUNTER — Ambulatory Visit (HOSPITAL_COMMUNITY)
Admission: RE | Admit: 2014-02-22 | Discharge: 2014-02-22 | Disposition: A | Discharge status: Home / Self Care | Payer: Medicare HMO | Source: Ambulatory Visit | Attending: Anesthesiology | Admitting: Anesthesiology

## 2014-02-22 DIAGNOSIS — R918 Other nonspecific abnormal finding of lung field: Secondary | ICD-10-CM | POA: Diagnosis not present

## 2014-02-22 DIAGNOSIS — J439 Emphysema, unspecified: Secondary | ICD-10-CM | POA: Insufficient documentation

## 2014-02-22 DIAGNOSIS — Z01818 Encounter for other preprocedural examination: Secondary | ICD-10-CM

## 2014-02-22 HISTORY — DX: Presence of automatic (implantable) cardiac defibrillator: Z95.810

## 2014-02-22 HISTORY — DX: Presence of cardiac pacemaker: Z95.0

## 2014-02-22 HISTORY — DX: Chronic obstructive pulmonary disease, unspecified: J44.9

## 2014-02-22 HISTORY — DX: Other nonspecific abnormal finding of lung field: R91.8

## 2014-02-22 HISTORY — DX: Unspecified osteoarthritis, unspecified site: M19.90

## 2014-02-22 LAB — BASIC METABOLIC PANEL
ANION GAP: 14 (ref 5–15)
BUN: 15 mg/dL (ref 6–23)
CO2: 26 mEq/L (ref 19–32)
Calcium: 9.2 mg/dL (ref 8.4–10.5)
Chloride: 100 mEq/L (ref 96–112)
Creatinine, Ser: 0.94 mg/dL (ref 0.50–1.35)
GFR calc Af Amer: 87 mL/min — ABNORMAL LOW (ref >90)
GFR calc non Af Amer: 75 mL/min — ABNORMAL LOW (ref >90)
GLUCOSE: 93 mg/dL (ref 70–99)
POTASSIUM: 4.8 meq/L (ref 3.7–5.3)
SODIUM: 140 meq/L (ref 137–147)

## 2014-02-22 LAB — CBC
HCT: 35.8 % — ABNORMAL LOW (ref 39.0–52.0)
Hemoglobin: 12.1 g/dL — ABNORMAL LOW (ref 13.0–17.0)
MCH: 31.8 pg (ref 26.0–34.0)
MCHC: 33.8 g/dL (ref 30.0–36.0)
MCV: 94 fL (ref 78.0–100.0)
Platelets: 252 10*3/uL (ref 150–400)
RBC: 3.81 MIL/uL — AB (ref 4.22–5.81)
RDW: 13.7 % (ref 11.5–15.5)
WBC: 6.9 10*3/uL (ref 4.0–10.5)

## 2014-02-22 NOTE — Pre-Procedure Instructions (Addendum)
Emil A Arai  02/22/2014   Your procedure is scheduled on:  02/27/14  Report to [facility] short stay admitting at630 AM.  Call this number if you have problems the morning of surgery: [phone]   Remember:   Do not eat food or drink liquids after midnight.   Take these medicines the morning of surgery with A SIP OF WATER: ativan,nitro,if needed, protonix, sotalol, flomax     Take all meds until day of surgery as ordered except as instructed below or per dr   Bridgette Habermann all herbel meds, nsaids (aleve,naproxen,advil,ibuprofen) 5 days prior to surgery starting now including vitamins,   aspirin per dr   Lazaro Arms not wear jewelry, make-up or nail polish.  Do not wear lotions, powders, or perfumes. You may wear deodorant.  Do not shave 48 hours prior to surgery. Men may shave face and neck.  Do not bring valuables to the hospital.  [facility] is not responsible                  for any belongings or valuables.               Contacts, dentures or bridgework may not be worn into surgery.  Leave suitcase in the car. After surgery it may be brought to your room.  For patients admitted to the hospital, discharge time is determined by your                treatment team.               Patients discharged the day of surgery will not be allowed to drive  home.  Name and phone number of your driver:   Special Instructions:  Special Instructions: [place] - Preparing for Surgery  Before surgery, you can play an important role.  Because skin is not sterile, your skin needs to be as free of germs as possible.  You can reduce the number of germs on you skin by washing with CHG (chlorahexidine gluconate) soap before surgery.  CHG is an antiseptic cleaner which kills germs and bonds with the skin to continue killing germs even after washing.  Please DO NOT use if you have an allergy to CHG or antibacterial soaps.  If your skin becomes reddened/irritated stop using the CHG and inform your nurse when you  arrive at Short Stay.  Do not shave (including legs and underarms) for at least 48 hours prior to the first CHG shower.  You may shave your face.  Please follow these instructions carefully:   1.  Shower with CHG Soap the night before surgery and the morning of Surgery.  2.  If you choose to wash your hair, wash your hair first as usual with your normal shampoo.  3.  After you shampoo, rinse your hair and body thoroughly to remove the Shampoo.  4.  Use CHG as you would any other liquid soap.  You can apply chg directly  to the skin and wash gently with scrungie or a clean washcloth.  5.  Apply the CHG Soap to your body ONLY FROM THE NECK DOWN.  Do not use on open wounds or open sores.  Avoid contact with your eyes ears, mouth and genitals (private parts).  Wash genitals (private parts)       with your normal soap.  6.  Wash thoroughly, paying special attention to the area where your surgery will be performed.  7.  Thoroughly rinse your body with warm water  from the neck down.  8.  DO NOT shower/wash with your normal soap after using and rinsing off the CHG Soap.  9.  Pat yourself dry with a clean towel.            10.  Wear clean pajamas.            11.  Place clean sheets on your bed the night of your first shower and do not sleep with pets.  Day of Surgery  Do not apply any lotions/deodorants the morning of surgery.  Please wear clean clothes to the hospital/surgery center.   Please read over the following fact sheets that you were given: Pain Booklet, Coughing and Deep Breathing and Surgical Site Infection Prevention

## 2014-02-22 NOTE — Progress Notes (Signed)
Anesthesia Note:  Patient is a 80 year old male scheduled for video bronchoscopy with endobronchial ultrasound on 02/27/14 by Dr. Lamonte Sakai. Patient was treated for PNA 10/2013 and CT showed a 1.1 X 1.2 cm RUL mass.   History includes CAD/MI '85 (no intervention), ischemic cardiomyopathy, CHF, VT s/p [employer] ICD originally '96 (last generator replacement 04/28/11), PAF, HTN, exertional dyspnea, former smoker, GERD, PUD, BPH.  PCP is listed as Dr. Janie Morning in [facility]).  Cardiologist is Dr. Cristopher Peru, last visit 03/16/13 (no new testing ordered at taht time) with follow-up scheduled for 03/20/14. Last remote ICD check 12/24/13.  Meds: Ativan, MVI, Flomax, ASA 325mg , Lasix, Claritin, Nitro, Protonix, KCL, Zantac, Betapace.  Cardiac cath 11/23/05: 40% ostial LM. 50% ostial CX. 20-30% OM. Left to right collaterals primarily from the CX to distal RCA. RCA chronically occludes proximally.  EF 50% with inferior wall hypokinesis by echo.  Echo 11/22/05:  - The left ventricle was mildly dilated. Left ventricular ejection fraction was estimated to be 50 %. This study was inadequate for the evaluation of left ventricular regional wall motion. - There is significant hypokinesis of the entire inferior wall, including the apical aspect of the inferior wall. The posterior wall endocardium is never seen well, but I suspect there is posterior hypokinesis.  EKG on 11/131/5: SR, ST/T wave abnormality, consider inferolateral ischemia.  T wave abnormality appears more pronounced since his last tracing on 03/13/12.   CXR on 02/22/14: COPD. The known spiculated right upper lobe mass is not evident. There is no evidence of other acute cardiopulmonary abnormality.  Preoperative labs noted.  Patient reports he feels well from a CV standpoint.  No new symptoms since his last cardiology visit.  No chest pain, SOB, palpitations.  He walks his dog 5-10 minutes on a flat surface on a regular basis and  does not have exertional symptoms at this level of activity.  He get mild, intermittent ankle edema. No ICD discharges. No Nitro use. He does not own a scale. No hemoptysis. His device is on the left side. On exam, heart RRR, with occasional ectopic beat.  No significant murmur noted.  Lungs initially coarse in the left base but cleared with deep breathing.  Right side clear.  Up to 1+ pretibial edema bilaterally.  I reviewed above with anesthesiologist Dr. Jenita Seashore.  Patient was seen by cardiology within the past year and has had recent device interrogation.  No new CV symptoms.  Anticipate that he can proceed with this procedure.  OR reports that procedure is booked to include electromagnetic navigation--which should influence his ICD device management (placing magnet over device could cause interference).  I have added this information to his perioperative device RX that was faxed to Dr. Tanna Furry office.  I've asked the nursing staff to notify the [facility].  [facility]/Anesthesiology Phone [phone] 02/22/2014 3:10 PM

## 2014-02-22 NOTE — Progress Notes (Addendum)
req'd ekg, discharge note from [facility] from 7/15- 8/15(pneumonia)  Faxed defib orders to dr Lovena Le   Heron Sabins from [facility] notified . Will be here for surgery.[phone]

## 2014-02-26 NOTE — Anesthesia Preprocedure Evaluation (Addendum)
Anesthesia Evaluation  Patient identified by MRN, date of birth, ID band Patient awake    Reviewed: Allergy & Precautions, H&P , NPO status , Patient's Chart, lab work & pertinent test results, reviewed documented beta blocker date and time   Airway Mallampati: II   Neck ROM: Full    Dental  (+) Edentulous Lower, Edentulous Upper   Pulmonary COPD COPD inhaler, former smoker (80 pack year hx, quit 1985),  breath sounds clear to auscultation        Cardiovascular hypertension, Pt. on medications + CAD and + Past MI + Cardiac Defibrillator Rhythm:Regular  CAD, SP CABG 1996, EF 50%, last invasive testing 2007 showed inferior hypokinesias,ICD [employer], interrogated 12/2013 has not fired, under care of cardiologist, reasonable exercise tolerance, no NTG use    Neuro/Psych Anxiety    GI/Hepatic GERD-  Medicated,  Endo/Other    Renal/GU      Musculoskeletal   Abdominal (+) + obese,  Abdomen: soft.    Peds  Hematology   Anesthesia Other Findings   Reproductive/Obstetrics                           Anesthesia Physical Anesthesia Plan  ASA: III  Anesthesia Plan: General   Post-op Pain Management:    Induction: Intravenous  Airway Management Planned: Oral ETT  Additional Equipment:   Intra-op Plan:   Post-operative Plan: Extubation in OR  Informed Consent: I have reviewed the patients History and Physical, chart, labs and discussed the procedure including the risks, benefits and alternatives for the proposed anesthesia with the patient or authorized representative who has indicated his/her understanding and acceptance.     Plan Discussed with:   Anesthesia Plan Comments:         Anesthesia Quick Evaluation

## 2014-02-26 NOTE — Progress Notes (Signed)
Jason device rep. with [employer] got information on patient and will be here between 07:30-  8 am to reprogram device

## 2014-02-27 ENCOUNTER — Ambulatory Visit (HOSPITAL_COMMUNITY): Payer: Medicare HMO

## 2014-02-27 ENCOUNTER — Encounter (HOSPITAL_COMMUNITY)
Admission: RE | Disposition: A | Discharge status: Home / Self Care | Payer: Self-pay | Source: Ambulatory Visit | Attending: Emergency Medicine

## 2014-02-27 ENCOUNTER — Ambulatory Visit (HOSPITAL_COMMUNITY): Payer: Medicare HMO | Admitting: Vascular Surgery

## 2014-02-27 ENCOUNTER — Ambulatory Visit (HOSPITAL_COMMUNITY)
Admission: RE | Admit: 2014-02-27 | Discharge: 2014-02-27 | Disposition: A | Discharge status: Home / Self Care | Payer: Medicare HMO | Source: Ambulatory Visit | Attending: Emergency Medicine | Admitting: Emergency Medicine

## 2014-02-27 DIAGNOSIS — F419 Anxiety disorder, unspecified: Secondary | ICD-10-CM | POA: Insufficient documentation

## 2014-02-27 DIAGNOSIS — I472 Ventricular tachycardia: Secondary | ICD-10-CM | POA: Insufficient documentation

## 2014-02-27 DIAGNOSIS — N400 Enlarged prostate without lower urinary tract symptoms: Secondary | ICD-10-CM | POA: Diagnosis not present

## 2014-02-27 DIAGNOSIS — I480 Paroxysmal atrial fibrillation: Secondary | ICD-10-CM | POA: Diagnosis not present

## 2014-02-27 DIAGNOSIS — Z95810 Presence of automatic (implantable) cardiac defibrillator: Secondary | ICD-10-CM | POA: Insufficient documentation

## 2014-02-27 DIAGNOSIS — Z87891 Personal history of nicotine dependence: Secondary | ICD-10-CM | POA: Insufficient documentation

## 2014-02-27 DIAGNOSIS — I2510 Atherosclerotic heart disease of native coronary artery without angina pectoris: Secondary | ICD-10-CM | POA: Insufficient documentation

## 2014-02-27 DIAGNOSIS — Z882 Allergy status to sulfonamides status: Secondary | ICD-10-CM | POA: Diagnosis not present

## 2014-02-27 DIAGNOSIS — J449 Chronic obstructive pulmonary disease, unspecified: Secondary | ICD-10-CM | POA: Insufficient documentation

## 2014-02-27 DIAGNOSIS — R911 Solitary pulmonary nodule: Secondary | ICD-10-CM | POA: Diagnosis present

## 2014-02-27 DIAGNOSIS — I255 Ischemic cardiomyopathy: Secondary | ICD-10-CM | POA: Diagnosis not present

## 2014-02-27 DIAGNOSIS — K279 Peptic ulcer, site unspecified, unspecified as acute or chronic, without hemorrhage or perforation: Secondary | ICD-10-CM | POA: Insufficient documentation

## 2014-02-27 DIAGNOSIS — K219 Gastro-esophageal reflux disease without esophagitis: Secondary | ICD-10-CM | POA: Insufficient documentation

## 2014-02-27 DIAGNOSIS — I119 Hypertensive heart disease without heart failure: Secondary | ICD-10-CM | POA: Insufficient documentation

## 2014-02-27 DIAGNOSIS — C3411 Malignant neoplasm of upper lobe, right bronchus or lung: Secondary | ICD-10-CM | POA: Insufficient documentation

## 2014-02-27 DIAGNOSIS — R918 Other nonspecific abnormal finding of lung field: Secondary | ICD-10-CM

## 2014-02-27 DIAGNOSIS — I509 Heart failure, unspecified: Secondary | ICD-10-CM | POA: Diagnosis not present

## 2014-02-27 DIAGNOSIS — Z9889 Other specified postprocedural states: Secondary | ICD-10-CM

## 2014-02-27 DIAGNOSIS — I252 Old myocardial infarction: Secondary | ICD-10-CM | POA: Insufficient documentation

## 2014-02-27 DIAGNOSIS — Z419 Encounter for procedure for purposes other than remedying health state, unspecified: Secondary | ICD-10-CM

## 2014-02-27 HISTORY — PX: VIDEO BRONCHOSCOPY WITH ENDOBRONCHIAL NAVIGATION: SHX6175

## 2014-02-27 LAB — TROPONIN I: Troponin I: <0.3 ng/mL

## 2014-02-27 SURGERY — VIDEO BRONCHOSCOPY WITH ENDOBRONCHIAL NAVIGATION
Anesthesia: General | Site: Chest | Laterality: Right

## 2014-02-27 MED ORDER — NEOSTIGMINE METHYLSULFATE 10 MG/10ML IV SOLN
INTRAVENOUS | Status: DC | PRN
  Administered 2014-02-27: 3 mg via INTRAVENOUS

## 2014-02-27 MED ORDER — GLYCOPYRROLATE 0.2 MG/ML IJ SOLN
INTRAMUSCULAR | Status: DC | PRN
  Administered 2014-02-27: .4 mg via INTRAVENOUS

## 2014-02-27 MED ORDER — LACTATED RINGERS IV SOLN
INTRAVENOUS | Status: DC | PRN
  Administered 2014-02-27: 08:00:00 via INTRAVENOUS

## 2014-02-27 MED ORDER — ROCURONIUM BROMIDE 100 MG/10ML IV SOLN
INTRAVENOUS | Status: DC | PRN
  Administered 2014-02-27: 30 mg via INTRAVENOUS

## 2014-02-27 MED ORDER — PHENYLEPHRINE HCL 10 MG/ML IJ SOLN
10.0000 mg | INTRAVENOUS | Status: DC | PRN
  Administered 2014-02-27: 100 ug/min via INTRAVENOUS

## 2014-02-27 MED ORDER — ROCURONIUM BROMIDE 50 MG/5ML IV SOLN
INTRAVENOUS | Status: AC
  Filled 2014-02-27: qty 1

## 2014-02-27 MED ORDER — PROPOFOL 10 MG/ML IV BOLUS
INTRAVENOUS | Status: AC
Start: 2014-02-27 — End: 2014-02-27
  Filled 2014-02-27: qty 20

## 2014-02-27 MED ORDER — ONDANSETRON HCL 4 MG/2ML IJ SOLN
INTRAMUSCULAR | Status: AC
  Filled 2014-02-27: qty 2

## 2014-02-27 MED ORDER — FENTANYL CITRATE 0.05 MG/ML IJ SOLN
INTRAMUSCULAR | Status: DC | PRN
  Administered 2014-02-27 (×2): 25 ug via INTRAVENOUS
  Administered 2014-02-27: 50 ug via INTRAVENOUS

## 2014-02-27 MED ORDER — FENTANYL CITRATE 0.05 MG/ML IJ SOLN
INTRAMUSCULAR | Status: AC
  Filled 2014-02-27: qty 5

## 2014-02-27 MED ORDER — ONDANSETRON HCL 4 MG/2ML IJ SOLN
INTRAMUSCULAR | Status: DC | PRN
Start: 2014-02-27 — End: 2014-02-27
  Administered 2014-02-27: 4 mg via INTRAVENOUS

## 2014-02-27 MED ORDER — LIDOCAINE HCL (CARDIAC) 20 MG/ML IV SOLN
INTRAVENOUS | Status: AC
  Filled 2014-02-27: qty 10

## 2014-02-27 MED ORDER — GLYCOPYRROLATE 0.2 MG/ML IJ SOLN
INTRAMUSCULAR | Status: AC
  Filled 2014-02-27: qty 2

## 2014-02-27 MED ORDER — PROPOFOL 10 MG/ML IV BOLUS
INTRAVENOUS | Status: DC | PRN
  Administered 2014-02-27: 75 mg via INTRAVENOUS
  Administered 2014-02-27: 30 mg via INTRAVENOUS

## 2014-02-27 MED ORDER — EPHEDRINE SULFATE 50 MG/ML IJ SOLN
INTRAMUSCULAR | Status: DC | PRN
  Administered 2014-02-27: 5 mg via INTRAVENOUS
  Administered 2014-02-27 (×2): 10 mg via INTRAVENOUS

## 2014-02-27 MED ORDER — LIDOCAINE HCL (CARDIAC) 20 MG/ML IV SOLN
INTRAVENOUS | Status: DC | PRN
  Administered 2014-02-27: 50 mg via INTRAVENOUS
  Administered 2014-02-27: 60 mg via INTRATRACHEAL

## 2014-02-27 MED ORDER — PROMETHAZINE HCL 25 MG/ML IJ SOLN: 6.2500 mg | INTRAMUSCULAR | Status: DC | PRN

## 2014-02-27 MED ORDER — ARTIFICIAL TEARS OP OINT
TOPICAL_OINTMENT | OPHTHALMIC | Status: AC
  Filled 2014-02-27: qty 3.5

## 2014-02-27 MED ORDER — STERILE WATER FOR INJECTION IJ SOLN
INTRAMUSCULAR | Status: AC
  Filled 2014-02-27: qty 10

## 2014-02-27 MED ORDER — FENTANYL CITRATE 0.05 MG/ML IJ SOLN: 25.0000 ug | INTRAMUSCULAR | Status: DC | PRN

## 2014-02-27 MED ORDER — SODIUM CHLORIDE 0.9 % IV SOLN
INTRAVENOUS | Status: DC
  Administered 2014-02-27: 12:00:00 via INTRAVENOUS

## 2014-02-27 MED ORDER — NEOSTIGMINE METHYLSULFATE 10 MG/10ML IV SOLN
INTRAVENOUS | Status: AC
  Filled 2014-02-27: qty 1

## 2014-02-27 MED ORDER — SODIUM CHLORIDE 0.9 % IV SOLN: Freq: Once | INTRAVENOUS | Status: DC

## 2014-02-27 MED ORDER — EPHEDRINE SULFATE 50 MG/ML IJ SOLN
INTRAMUSCULAR | Status: AC
  Filled 2014-02-27: qty 1

## 2014-02-27 MED ORDER — 0.9 % SODIUM CHLORIDE (POUR BTL) OPTIME
TOPICAL | Status: DC | PRN
  Administered 2014-02-27: 1000 mL

## 2014-02-27 MED ORDER — MEPERIDINE HCL 25 MG/ML IJ SOLN: 6.2500 mg | INTRAMUSCULAR | Status: DC | PRN

## 2014-02-27 MED ORDER — SUCCINYLCHOLINE CHLORIDE 20 MG/ML IJ SOLN
INTRAMUSCULAR | Status: AC
  Filled 2014-02-27: qty 1

## 2014-02-27 SURGICAL SUPPLY — 38 items
BLADE 10 SAFETY STRL DISP (BLADE) ×1 IMPLANT
BRUSH CYTOL CELLEBRITY 1.5X140 (MISCELLANEOUS) ×5 IMPLANT
BRUSH SUPERTRAX BIOPSY (INSTRUMENTS) IMPLANT
BRUSH SUPERTRAX NDL-TIP CYTO (INSTRUMENTS) ×2 IMPLANT
CANISTER SUCTION 2500CC (MISCELLANEOUS) ×3 IMPLANT
CHANNEL WORK EXTEND EDGE 180 (KITS) IMPLANT
CHANNEL WORK EXTEND EDGE 45 (KITS) IMPLANT
CHANNEL WORK EXTEND EDGE 90 (KITS) IMPLANT
CONT SPEC 4OZ CLIKSEAL STRL BL (MISCELLANEOUS) ×3 IMPLANT
COVER TABLE BACK 60X90 (DRAPES) ×3 IMPLANT
FILTER STRAW FLUID ASPIR (MISCELLANEOUS) IMPLANT
FORCEPS BIOP SUPERTRX PREMAR (INSTRUMENTS) ×2 IMPLANT
GAUZE SPONGE 4X4 12PLY STRL (GAUZE/BANDAGES/DRESSINGS) ×3 IMPLANT
GLOVE BIOGEL M STRL SZ7.5 (GLOVE) ×6 IMPLANT
KIT CLEAN ENDO COMPLIANCE (KITS) ×3 IMPLANT
KIT LOCATABLE GUIDE (CANNULA) IMPLANT
KIT MARKER FIDUCIAL DELIVERY (KITS) IMPLANT
KIT PROCEDURE EDGE 180 (KITS) ×4 IMPLANT
KIT PROCEDURE EDGE 45 (KITS) IMPLANT
KIT PROCEDURE EDGE 90 (KITS) IMPLANT
KIT ROOM TURNOVER OR (KITS) ×3 IMPLANT
LINEAR FIDUCIAL MARKER .75X1CM (Orthopedic Implant) ×2 IMPLANT
MARKER SKIN DUAL TIP RULER LAB (MISCELLANEOUS) ×3 IMPLANT
NDL SUPERTRAX BIOPSY (NEEDLE) IMPLANT
NDL SUPERTRX PREMARK BIOPSY (NEEDLE) IMPLANT
NEEDLE SUPERTRAX BIOPSY (NEEDLE) ×3 IMPLANT
NEEDLE SUPERTRX PREMARK BIOPSY (NEEDLE) IMPLANT
NS IRRIG 1000ML POUR BTL (IV SOLUTION) ×3 IMPLANT
OIL SILICONE PENTAX (PARTS (SERVICE/REPAIRS)) ×1 IMPLANT
PAD ARMBOARD 7.5X6 YLW CONV (MISCELLANEOUS) ×6 IMPLANT
PATCHES PATIENT (LABEL) ×7 IMPLANT
SYR 20CC LL (SYRINGE) ×3 IMPLANT
SYR 20ML ECCENTRIC (SYRINGE) ×3 IMPLANT
SYR 50ML SLIP (SYRINGE) ×1 IMPLANT
TOWEL OR 17X24 6PK STRL BLUE (TOWEL DISPOSABLE) ×1 IMPLANT
TRAP SPECIMEN MUCOUS 40CC (MISCELLANEOUS) IMPLANT
TUBE CONNECTING 20'X1/4 (TUBING) ×1
TUBE CONNECTING 20X1/4 (TUBING) ×2 IMPLANT

## 2014-02-27 NOTE — Anesthesia Procedure Notes (Signed)
Procedure Name: Intubation Date/Time: 02/27/2014 8:31 AM Performed by: [place] Pink Pre-anesthesia Checklist: Patient identified, Emergency Drugs available, Suction available, Patient being monitored and Timeout performed Patient Re-evaluated:Patient Re-evaluated prior to inductionOxygen Delivery Method: Circle system utilized Preoxygenation: Pre-oxygenation with 100% oxygen Intubation Type: IV induction Ventilation: Mask ventilation without difficulty Laryngoscope Size: Mac and 4 Grade View: Grade I Tube type: Oral Tube size: 8.5 mm Number of attempts: 1 Airway Equipment and Method: Stylet and LTA kit utilized Placement Confirmation: ETT inserted through vocal cords under direct vision,  positive ETCO2 and breath sounds checked- equal and bilateral Secured at: 23 cm Tube secured with: Tape Dental Injury: Teeth and Oropharynx as per pre-operative assessment

## 2014-02-27 NOTE — Transfer of Care (Signed)
Immediate Anesthesia Transfer of Care Note  Patient: George Acosta  Procedure(s) Performed: Procedure(s): VIDEO BRONCHOSCOPY WITH ENDOBRONCHIAL NAVIGATION,insertion Feducial marker right upper lobe lung (Right)  Patient Location: PACU  Anesthesia Type:General  Level of Consciousness: awake, alert  and oriented  Airway & Oxygen Therapy: Patient Spontanous Breathing and Patient connected to nasal cannula oxygen  Post-op Assessment: Report given to PACU RN and Post -op Vital signs reviewed and stable  Post vital signs: Reviewed and stable  Complications: No apparent anesthesia complications

## 2014-02-27 NOTE — Op Note (Signed)
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  Estimated Blood Loss: Minimal  Complications: None Apparent  Indications and History: George Acosta is a 80 y.o. male with Hx tobacco use. He was fouind to have a spiculated RUL nodule. Recommendation was made to pursue biopsy via navigational bronchoscopy.  The risks, benefits, complications, treatment options and expected outcomes were discussed with the patient.  The possibilities of pneumothorax, pneumonia, reaction to medication, pulmonary aspiration, perforation of a viscus, bleeding, failure to diagnose a condition and creating a complication requiring transfusion or operation were discussed with the patient who freely signed the consent.    Description of Procedure: The patient was seen in the Preoperative Area, was examined and was deemed appropriate to proceed.  The patient was taken to OR 10, identified as [name] and the procedure verified as Flexible Video Fiberoptic Bronchoscopy.  A Time Out was held and the above information confirmed.   Prior to the date of the procedure a high-resolution CT scan of the chest was performed. Utilizing [place] a virtual tracheobronchial tree was generated to allow the creation of distinct navigation pathways to the patient's parenchymal abnormality. General anesthesia was initiated and the patient  was orally intubated. The video fiberoptic bronchoscope was introduced via the endotracheal tube and a general inspection was performed which showed Some scattered mucous that was suctioned. There were no endobronchial lesions. The extendable working channel and locator guide were  introduced into the bronchoscope. The distinct navigation pathways prepared prior to this procedure were then utilized to navigate to within 1.0 cm of patient's lesion identified on CT scan. The extendable working channel was secured into place and the locator guide was withdrawn. Under fluoroscopic guidance transbronchial needle brushings, transbronchial Wang needle biopsies, and transbronchial forceps biopsies were performed to be sent for cytology and pathology. Preliminary path results suggested NSCLCA so a single gold fiducial marker was placed at the lesion for potential future radiation therapy. At the end of the procedure a general airway inspection was performed and there was no evidence of active bleeding. The bronchoscope was removed.  The patient tolerated the procedure well. There was no significant blood loss and there were no obvious complications. A post-procedural chest x-ray is pending.  Samples: 1. Transbronchial needle brushings from RUL 2. Transbronchial Wang needle biopsies from RUL 3. Transbronchial forceps biopsies from RUL 4. Gold fiducial marker placement at RUL nodule  Plans:  The patient will be discharged from the PACU to home when recovered from anesthesia and after chest x-ray is reviewed. We will review the cytology, pathology results with the patient when they become available. Outpatient followup will be with Dr Lamonte Sakai or Dr Joya Gaskins.    Baltazar Apo, MD, PhD 02/27/2014, 10:27 AM  Pulmonary and Critical Care [phone] or if no answer [phone]

## 2014-02-27 NOTE — Progress Notes (Signed)
Rep from [place] at bedside to adjust the device. Noted a section of A-fib which has not occurred before.  I notified Dr. Lamonte Sakai of this and he is going to stop by bedside and see patient. No chest pain or discomfort. Will continue to monitor patient.

## 2014-02-27 NOTE — Anesthesia Postprocedure Evaluation (Signed)
  Anesthesia Post-op Note  Patient: George Acosta  Procedure(s) Performed: Procedure(s): VIDEO BRONCHOSCOPY WITH ENDOBRONCHIAL NAVIGATION,insertion Feducial marker right upper lobe lung (Right)  Patient Location: PACU  Anesthesia Type:General  Level of Consciousness: awake  Airway and Oxygen Therapy: Patient Spontanous Breathing and Patient connected to nasal cannula oxygen  Post-op Pain: none  Post-op Assessment: Post-op Vital signs reviewed, Patient's Cardiovascular Status Stable, Respiratory Function Stable, Patent Airway and No signs of Nausea or vomiting  Post-op Vital Signs: Reviewed and stable  Last Vitals:  Filed Vitals:   02/27/14 0632  BP: 102/60  Pulse: 59  Temp: 36.4 C  Resp: 18    Complications: No apparent anesthesia complications

## 2014-02-27 NOTE — H&P (Signed)
Patient ID: George Acosta, male DOB: Jul 27, 1928, 80 y.o. MRN: [id number]  HPI 01/15/2014 Chief Complaint  Patient presents with  . Pulmonary Consult    Lung mass-ref. by Dr. Grace Acosta [facility]. 10/2013.no sob,cough-yellow and white,denies cp or tightness,no wheezing  This patient is referred for evaluation of right upper lobe lung mass. The patient was in the hospital with diagnosed pneumonia in July 2015. During this hospitalization a CT scan was performed and demonstrated a right upper lobe lung mass 1.1 x 1.2 cm in diameter with irregular borders. The patient received a course of antibiotics and a followup CT scan in August 2015 revealed persistent right upper lobe lesion. The patient's level of dyspnea is improved. There is minimal cough. There is no chest pain. There is no hemoptysis. No weight loss. Is no fever chills or sweats. Is occasional dizziness. The patient's not had frequent falls. Is not on oxygen therapy. He has never been diagnosed recently and treated for chronic lung disease. The patient is an ex-smoker. The patient smoked 80 pack years quitting in 1985  H&P 02/27/14  --  Pt presents today for ENB and bx's of his RUL nodule. Reviewed his status - no changes compared with last visit. Discussed case with him and his daughter. All questions answered. No barriers to proceeding  Past Medical History  Diagnosis Date  . Coronary atherosclerosis of unspecified type of vessel, native or graft   . Unspecified essential hypertension   . Benign prostatic hypertrophy   . Peptic ulcer disease   . Diverticulosis   . SOB (shortness of breath)   . GERD (gastroesophageal reflux disease)   . Ischemic cardiomyopathy   . Congestive heart failure, unspecified   . Arrhythmia     paroxysmal afib  . Ventricular tachycardia     A. monomorphic VT B. 04/2011 s/p generator change - [employer] ICD [phone]  . MI  (myocardial infarction) 1985    "big MI after I got to hospital"  . Acute myocardial infarction, unspecified site, episode of care unspecified 1985    "on way to hospital"  . Pneumonia 1980's  . Anxiety   . Pancreatitis 1980's  . Paroxysmal atrial fibrillation      Family History  Problem Relation Age of Onset  . Heart disease Mother   . Heart disease Daughter   . Cancer Daughter      History   Social History  . Marital Status: Widowed    Spouse Name: N/A    Number of Children: N/A  . Years of Education: N/A   Occupational History  . retired Administrator    Social History Main Topics  . Smoking status: Former Smoker -- 2.00 packs/day for 40 years    Types: Cigarettes    Quit date: 04/18/1983  . Smokeless tobacco: Former Systems developer    Types: Snuff, Chew     Comment: quit smoking cigarettes 1985; quit smokeless tobacco ~ 2011  . Alcohol Use: No  . Drug Use: No  . Sexual Activity: No   Other Topics Concern  . Not on file   Social History Narrative   The patient lives in [place]. He is widowed. 2 boys 2girls   He is a retired Administrator   Patient is former smoker no tobacco use   Caffeine use - 3 cups     Allergies  Allergen Reactions  . Sulfonamide Derivatives Hives        Objective:   Physical Exam Filed Vitals:  02/27/14 0632  BP: 102/60  Pulse: 59  Temp: 97.6 F (36.4 C)  TempSrc: Oral  Resp: 18  Weight: 63.504 kg (140 lb)  SpO2: 99%                            Gen: Pleasant, well-nourished, in no distress, normal affect, a bit forgetful  ENT: No lesions, mouth clear, oropharynx clear, no postnasal drip  Neck: No JVD, no TMG, no carotid bruits  Lungs: No use of accessory muscles, distant breath sounds  Cardiovascular: RRR, heart sounds normal, no murmur or gallops, no peripheral  edema  Musculoskeletal: No deformities, no cyanosis or clubbing  Neuro: alert, non focal  Skin: Warm, no lesions or rashes   Recent Labs Lab 02/22/14 1050  HGB 12.1*  HCT 35.8*  WBC 6.9  PLT 252    Recent Labs Lab 02/22/14 1050  NA 140  K 4.8  CL 100  CO2 26  GLUCOSE 93  BUN 15  CREATININE 0.94  CALCIUM 9.2    CT Chest 11/2013: Irregular right upper lobe 1.1 x 1.2 cm mass reidentified image 8, not significantly changed when allowing for differences in measurement technique and slice selection. Mild central bronchial wall thickening is evident. There are areas of subpleural tree-in-bud type nodular airspace opacity bilaterally. Curvilinear left lower lobe atelectasis has improved. Emphysematous changes are reidentified.     Assessment & Plan:   Lung mass RUL 1.1x 1.2 cm  Right upper lobe solid mass with irregular border 1.1 x 1.2 cm in diameter. Likely represents malignancy. No significant mediastinal or hilar nodes seen. No other nodules are seen. There has been no change in this when compared to prior scans in July 2015.   Plan Will proceed to FOB + ENB this am.  Will perform Percepta testing as well.   Baltazar Apo, MD, PhD 02/27/2014, 8:33 AM [facility] [phone] or if no answer [phone]

## 2014-02-27 NOTE — Discharge Instructions (Signed)
Flexible Bronchoscopy, Care After These instructions give you information on caring for yourself after your procedure. Your doctor may also give you more specific instructions. Call your doctor if you have any problems or questions after your procedure. HOME CARE  Do not eat or drink anything for 2 hours after your procedure. If you try to eat or drink before the medicine wears off, food or drink could go into your lungs. You could also burn yourself.  After 2 hours have passed and when you can cough and gag normally, you may eat soft food and drink liquids slowly.  The day after the test, you may eat your normal diet.  You may do your normal activities.  Keep all doctor visits. GET HELP RIGHT AWAY IF:  You get more and more short of breath.  You get light-headed.  You feel like you are going to pass out (faint).  You have chest pain.  You have new problems that worry you.  You cough up more than a little blood.  You cough up more blood than before. MAKE SURE YOU:  Understand these instructions.  Will watch your condition.  Will get help right away if you are not doing well or get worse. Document Released: 01/24/2009 Document Revised: 04/03/2013 Document Reviewed: 12/01/2012 [facility] Patient Information 2015 [place], [place]. This information is not intended to replace advice given to you by your health care provider. Make sure you discuss any questions you have with your health care provider.  Please call our office for any questions or problems. [phone].   What to eat:  For your first meals, you should eat lightly; only small meals initially.  If you do not have nausea, you may eat larger meals.  Avoid spicy, greasy and heavy food.    General Anesthesia, Adult, Care After  Refer to this sheet in the next few weeks. These instructions provide you with information on caring for yourself after your procedure. Your health care provider may also give you more specific  instructions. Your treatment has been planned according to current medical practices, but problems sometimes occur. Call your health care provider if you have any problems or questions after your procedure.  WHAT TO EXPECT AFTER THE PROCEDURE  After the procedure, it is typical to experience:  Sleepiness.  Nausea and vomiting. HOME CARE INSTRUCTIONS  For the first 24 hours after general anesthesia:  Have a responsible person with you.  Do not drive a car. If you are alone, do not take public transportation.  Do not drink alcohol.  Do not take medicine that has not been prescribed by your health care provider.  Do not sign important papers or make important decisions.  You may resume a normal diet and activities as directed by your health care provider.  Change bandages (dressings) as directed.  If you have questions or problems that seem related to general anesthesia, call the hospital and ask for the anesthetist or anesthesiologist on call. SEEK MEDICAL CARE IF:  You have nausea and vomiting that continue the day after anesthesia.  You develop a rash. SEEK IMMEDIATE MEDICAL CARE IF:  You have difficulty breathing.  You have chest pain.  You have any allergic problems. Document Released: 07/05/2000 Document Revised: 11/29/2012 Document Reviewed: 10/12/2012  [facility] Patient Information 2014 [place], [place].

## 2014-03-01 ENCOUNTER — Encounter (HOSPITAL_COMMUNITY): Payer: Self-pay | Admitting: Emergency Medicine

## 2014-03-06 ENCOUNTER — Telehealth: Payer: Self-pay | Admitting: Emergency Medicine

## 2014-03-06 DIAGNOSIS — R918 Other nonspecific abnormal finding of lung field: Secondary | ICD-10-CM

## 2014-03-06 NOTE — Telephone Encounter (Signed)
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 Will continue to try to reach them.

## 2014-03-06 NOTE — Telephone Encounter (Signed)
Thank you rob  i would like to refer to [facility] cancer ctr unless you think Arta Bruce could do primary XRT. Not sure he is a surgery candidate.

## 2014-03-08 NOTE — Telephone Encounter (Signed)
i was able to speak to the pt.  The pt needs a referral to radiation oncology for stereotactic XRT to the lung cancer lesion. Please have [facility] make the referral for this pt to [facility],  He also will need a medical oncology consult later to [facility]  We can cancel the OV on 12/3 with dr byrum  Thank you rob

## 2014-03-08 NOTE — Telephone Encounter (Signed)
Need referral's placed for radiation oncology for sterotactic XRT to the lung cancer lesion. Referral to [facility] Radiation Therapy. Then patient will also need a referral placed for Medical Oncology consult later to [facility].  Please cancel OV on 12/3/ with Dr. Lamonte Sakai per Dr Joya Gaskins if not already. Rhonda J Cobb

## 2014-03-08 NOTE — Telephone Encounter (Signed)
Pt returned call and asks that they be reached on home phone today [phone].  Bing, CMA

## 2014-03-08 NOTE — Telephone Encounter (Signed)
Ov cancelled for 12/3.  Referrals placed.  Forwarding to PCC's to follow up on.

## 2014-03-13 NOTE — Progress Notes (Signed)
Thoracic Location of Tumor / Histology:   [name] was in the hospital with diagnosed pneumonia in July 2015. During this hospitalization a CT scan was performed and demonstrated a right upper lobe lung mass 1.1 x 1.2 cm in diameter with irregular borders. The patient received a course of antibiotics and a followup CT scan in August 2015 revealed persistent right upper lobe lesion and at this time his level of dyspnea had improved and there was minimal cough, no chest pain, hemoptysis, weight loss, fever chills or sweats. Occasional dizziness  Biopsies of Lung, right Upper Lobe (if applicable) revealed:  16/10/96 Diagnosis Lung, biopsy, right upper lobe - NON-SMALL CELL CARCINOMA  02/27/14 Diagnosis TRANSBRONCHIAL NEEDLE ASPIRATION, NAVIGATION, RIGHT UPPER LOBE (SPECIMEN 2 OF 2 COLLECTED 02-27-2014) MALIGNANT CELLS PRESENT, CONSISTENT WITH NON-SMALL CELL CARCINOMA.  Tobacco/Marijuana/Snuff/ETOH use: 2 PPD x 40 years, stopped 04/18/1983, No Drinking, No Illicit drug Use  Past/Anticipated interventions by cardiothoracic surgery, if any: Dr. Asencion Noble :  02/27/14 Flexible video fiberoptic bronchoscopy with electromagnetic navigation and biopsies Pet performed on 11/3 or 11/4 at [facility].  Past/Anticipated interventions by medical oncology, if any: ?None  Signs/Symptoms  Weight changes, if EAV:[id number], no change  Respiratory complaints, if any: Dyspnea at times.h/o copd  Hemoptysis, if any: None  Pain issues, if any:No  SAFETY ISSUES:  Prior radiation? No  Pacemaker/ICD?  AICD (automatic cardioverter/defibrillator) place by Dr. Cristopher Peru  Possible current pregnancy?N/A  Is the patient on methotrexate? No Ambulates with cane.  Current Complaints / other details: Lives with daughter and son whom are here with him today.

## 2014-03-14 ENCOUNTER — Encounter: Payer: Self-pay | Admitting: Radiation Oncology

## 2014-03-14 ENCOUNTER — Inpatient Hospital Stay: Payer: Medicare HMO | Admitting: Emergency Medicine

## 2014-03-14 ENCOUNTER — Telehealth: Payer: Self-pay | Admitting: *Deleted

## 2014-03-14 ENCOUNTER — Ambulatory Visit
Admission: RE | Admit: 2014-03-14 | Discharge: 2014-03-14 | Disposition: A | Discharge status: Home / Self Care | Payer: Medicare HMO | Source: Ambulatory Visit | Attending: Radiation Oncology | Admitting: Radiation Oncology

## 2014-03-14 VITALS — BP 116/59 | HR 63 | Temp 97.5°F | Resp 20 | Wt 147.6 lb

## 2014-03-14 DIAGNOSIS — R918 Other nonspecific abnormal finding of lung field: Secondary | ICD-10-CM

## 2014-03-14 DIAGNOSIS — C3411 Malignant neoplasm of upper lobe, right bronchus or lung: Secondary | ICD-10-CM | POA: Insufficient documentation

## 2014-03-14 NOTE — Progress Notes (Signed)
Radiation Oncology         [phone]) [phone] ________________________________  Initial outpatient Consultation - Date: 03/14/2014   Name: George Acosta MRN: [id number]   DOB: 11-23-1928  REFERRING PHYSICIAN: Elsie Stain, MD  DIAGNOSIS:    ICD-9-CM ICD-10-CM  1. Lung mass RUL 1.1x 1.2 cm  786.6 R91.8    STAGE: Stage I NSCLC of the Right upper lobe  HISTORY OF PRESENT ILLNESS::George Acosta is a 80 y.o. male  was hospitalized for pneumonia in July 2015. A CT scan was performed and demonstrated a right upper lobe lung mass. He received a course of antibiotics and a followup CT scan in August 2015 at [facility] revealed persistent right upper lobe lesion which was stable in size at 1.1 x 1.2 cm. No pathologically enlarged mediastinal or hilar lymph nodes were seen. He was referred to pulmonology and an ENB was performed on 11/18 with biopsy of the lesion showing NSCLC favoring squamous cell carcinoma. He had a PET scan at [facility] that showed this lung nodule as a solitary finding.  He is an ex-smoker. The patient smoked 80 pack years quitting in [place]: No  FAMILY HISTORY:  Family History  Problem Relation Age of Onset  . Heart disease Mother   . Heart disease Daughter   . Cancer Daughter     SOCIAL HISTORY:  History  Substance Use Topics  . Smoking status: Former Smoker -- 2.00 packs/day for 40 years    Types: Cigarettes    Quit date: 04/18/1983  . Smokeless tobacco: Former Systems developer    Types: Snuff, Chew     Comment: quit smoking cigarettes 1985; quit smokeless tobacco ~ 2011  . Alcohol Use: No    REVIEW OF SYSTEMS:  A 15 point review of systems is documented in the electronic medical record. This was obtained by the nursing staff. However, I reviewed this with the patient to discuss relevant findings and make appropriate changes.  Pertinent positives are included in the chart.   PHYSICAL EXAM: There were no vitals filed for this visit.. .  Pleasant male. No distress. Alert and oriented x 3. No respiratory distress. Walks with a cane. Normal gain.   IMPRESSION: T1N0 NSCLC of the right upper lobe.   PLAN: We discussed that he has stage I lung cancer and the results of SBRT with provide a greater than 90% local control. We discussed that he may fail in the mediastinum hilum or elsewhere in the body and radiation was a local only process. We discussed the process of simulation and the use of a pad all to decrease respiratory motion. We discussed the use of 4-dimensional simulation to minimize normal lung tissue treated and the use of respiratory compression. We discussed 3 treatments occurring every other day as an outpatient. We discussed these treatments will last about 10-20 minutes and he would be here at the hospital for about an hour. We discussed that SBRT was unlikely to make his breathing any worse. It is also unlikely to make his breathing symptoms any better. We discussed possible side effects including his shoulder pain due to arm positioning and possible rib fracture withthe pleural-based nodules proximity to the ribs. We discussed damage to other critical normal structures including heart, ribs, lung collapse, chronic cough, and brachial plexus injury. We discussed that without treatment this  could develop into a more aggressive or even metastatic cancer.   We discussed the need for interrogation of his pacemaker/coordination with cardiology.  He needs a CT of the head which we will schedule at [facility].   He would like to be scheduled for the end of December and start his treatments after the holidays.  We will get him scheduled today.   He signed informed consent and agreed to proceed forward.    I spent 40 minutes  face to face with the patient and more than 50% of that time was spent in counseling and/or coordination of care.   ------------------------------------------------  Thea Silversmith, MD

## 2014-03-14 NOTE — Telephone Encounter (Signed)
Called patient to inform that test has been moved to [facility] (Radiology) on 03-20-14 - arrival time - 9:30 am, patient to be NPO 2 hrs. Prior to test, lvm for a return call

## 2014-03-14 NOTE — Addendum Note (Signed)
Encounter addended by: Arlyss Repress, RN on: 03/14/2014  2:03 PM<BR>     Documentation filed: Charges VN

## 2014-03-14 NOTE — Progress Notes (Signed)
Please see the Nurse Progress Note in the MD Initial Consult Encounter for this patient. 

## 2014-03-15 ENCOUNTER — Telehealth: Payer: Self-pay | Admitting: *Deleted

## 2014-03-15 NOTE — Telephone Encounter (Signed)
CALLED PATIENT TO INFORM THAT SCAN HAS BEEN MOVED TO [place] ([place]) ON 03-20-14 - ARRIVAL TIME - 9:30 AM, SPOKE WITH PATIENT'S DAUGHTER KIM AND THEY ARE AWARE OF THE SCAN BEING MOVED

## 2014-03-20 ENCOUNTER — Ambulatory Visit (INDEPENDENT_AMBULATORY_CARE_PROVIDER_SITE_OTHER): Payer: Medicare HMO | Admitting: Internal Medicine

## 2014-03-20 ENCOUNTER — Ambulatory Visit (HOSPITAL_COMMUNITY): Payer: Medicare HMO

## 2014-03-20 ENCOUNTER — Encounter: Payer: Self-pay | Admitting: Internal Medicine

## 2014-03-20 VITALS — BP 88/52 | HR 61 | Ht 69.0 in | Wt 148.2 lb

## 2014-03-20 DIAGNOSIS — I4729 Other ventricular tachycardia: Secondary | ICD-10-CM

## 2014-03-20 DIAGNOSIS — Z95810 Presence of automatic (implantable) cardiac defibrillator: Secondary | ICD-10-CM

## 2014-03-20 DIAGNOSIS — I480 Paroxysmal atrial fibrillation: Secondary | ICD-10-CM

## 2014-03-20 DIAGNOSIS — R918 Other nonspecific abnormal finding of lung field: Secondary | ICD-10-CM

## 2014-03-20 DIAGNOSIS — I472 Ventricular tachycardia: Secondary | ICD-10-CM

## 2014-03-20 DIAGNOSIS — I255 Ischemic cardiomyopathy: Secondary | ICD-10-CM

## 2014-03-20 LAB — MDC_IDC_ENUM_SESS_TYPE_INCLINIC
Brady Statistic RA Percent Paced: 14 %
Date Time Interrogation Session: 20151209050000
HIGH POWER IMPEDANCE MEASURED VALUE: 53 Ohm
HIGH POWER IMPEDANCE MEASURED VALUE: 81 Ohm
Implantable Pulse Generator Serial Number: 102353
Lead Channel Impedance Value: 512 Ohm
Lead Channel Pacing Threshold Amplitude: 0.7 V
Lead Channel Pacing Threshold Amplitude: 0.9 V
Lead Channel Pacing Threshold Pulse Width: 0.4 ms
Lead Channel Pacing Threshold Pulse Width: 0.4 ms
Lead Channel Sensing Intrinsic Amplitude: 13.2 mV
Lead Channel Setting Pacing Amplitude: 2 V
Lead Channel Setting Pacing Amplitude: 2.4 V
Lead Channel Setting Pacing Pulse Width: 0.4 ms
MDC IDC MSMT LEADCHNL RA IMPEDANCE VALUE: 511 Ohm
MDC IDC MSMT LEADCHNL RA SENSING INTR AMPL: 1.4 mV
MDC IDC SET LEADCHNL RV SENSING SENSITIVITY: 0.5 mV
MDC IDC SET ZONE DETECTION INTERVAL: 316 ms
MDC IDC STAT BRADY RV PERCENT PACED: 1 % — AB
Zone Setting Detection Interval: 273 ms
Zone Setting Detection Interval: 429 ms

## 2014-03-20 NOTE — Assessment & Plan Note (Signed)
He has had no recurrent ventricular tachycardia since his last check. He will continue his antiarrhythmic drug therapy.

## 2014-03-20 NOTE — Assessment & Plan Note (Signed)
He is pending radiation therapy. His appetite is good. He appears to have a localized tumor. From my perspective, no additional evaluation of his ICD as needed prior to the initiation of radiation therapy.

## 2014-03-20 NOTE — Assessment & Plan Note (Signed)
His [employer] ICD is working normally. We'll plan to recheck in several months.

## 2014-03-20 NOTE — Progress Notes (Signed)
HPI George Acosta returns today for followup. He is a very pleasant 80 year old man with chronic systolic heart failure, and ischemic cardiomyopathy, ventricular tachycardia, status post ICD implantation. The patient denies chest pain. He has chronic class II dyspnea. He has had intermittent heart failure symptoms. He denies peripheral edema.  He notes mild nasal congestion but no fevers or chills. He is recently been diagnosed with lung cancer on the right. He is pending radiation therapy. His appetite remains reasonably good. He has not had hemoptysis. He does sound a bit hoarse to me. Allergies  Allergen Reactions  . Sulfonamide Derivatives Hives     Current Outpatient Prescriptions  Medication Sig Dispense Refill  . aspirin 325 MG tablet Take 325 mg by mouth daily.    . furosemide (LASIX) 20 MG tablet Take 30 mg by mouth daily.     Marland Kitchen loratadine (CLARITIN) 10 MG tablet Take 10 mg by mouth daily.    Marland Kitchen LORazepam (ATIVAN) 0.5 MG tablet Take 0.25 mg by mouth 2 (two) times daily.    . Multiple Vitamin (MULTIVITAMIN WITH MINERALS) TABS Take 1 tablet by mouth daily.    . nitroGLYCERIN (NITROSTAT) 0.4 MG SL tablet Place 1 tablet (0.4 mg total) under the tongue every 5 (five) minutes as needed. For chest pain (Patient taking differently: Place 0.4 mg under the tongue every 5 (five) minutes as needed for chest pain. ) 30 tablet 11  . pantoprazole (PROTONIX) 40 MG tablet Take 40 mg by mouth daily.     . potassium chloride SA (K-DUR,KLOR-CON) 20 MEQ tablet Take 20 mEq by mouth daily.      . ranitidine (ZANTAC) 150 MG tablet Take 75 mg by mouth at bedtime.     . sotalol (BETAPACE) 80 MG tablet TAKE 2 TABLETS TWICE DAILY. (Patient taking differently: TAKE 2 TABLETS BY MOUTH TWICE DAILY.) 120 tablet 3  . Tamsulosin HCl (FLOMAX) 0.4 MG CAPS Take 0.4 mg by mouth Daily.     No current facility-administered medications for this visit.     Past Medical History  Diagnosis Date  . Coronary atherosclerosis of  unspecified type of vessel, native or graft   . Unspecified essential hypertension   . Benign prostatic hypertrophy   . Peptic ulcer disease   . Diverticulosis   . GERD (gastroesophageal reflux disease)   . Ischemic cardiomyopathy   . Congestive heart failure, unspecified   . Arrhythmia     paroxysmal afib  . Ventricular tachycardia     A.   monomorphic VT  B. 04/2011 s/p generator change - [employer] ICD  [phone]  . MI (myocardial infarction) 1985    "big MI after I got to hospital"  . Acute myocardial infarction, unspecified site, episode of care unspecified 1985    "on way to hospital"  . Pneumonia 1980's  . Anxiety   . Pancreatitis 1980's  . Paroxysmal atrial fibrillation   . AICD (automatic cardioverter/defibrillator) present     [employer]  . Presence of permanent cardiac pacemaker   . COPD (chronic obstructive pulmonary disease)   . SOB (shortness of breath)     not recently  . Arthritis   . Lung mass     ROS:   All systems reviewed and negative except as noted in the HPI.   Past Surgical History  Procedure Laterality Date  . Icd  2001; 2007; 04/28/11    generator change; complete replacement; lead replacement  . Laparotomy  11/2005    for small bowel  obstruction,  . Cardiac catheterization  11/23/2005  . Cardiac defibrillator placement  1996  . Cholecystectomy  1980's  . Cataract extraction  2012    left eye  . Video bronchoscopy with endobronchial navigation Right 02/27/2014    Procedure: VIDEO BRONCHOSCOPY WITH ENDOBRONCHIAL NAVIGATION,insertion Feducial marker right upper lobe lung;  Surgeon: Collene Gobble, MD;  Location: [place] [facility];  Service: Thoracic;  Laterality: Right;     Family History  Problem Relation Age of Onset  . Heart disease Mother   . Heart disease Daughter   . Cancer Daughter      History   Social History  . Marital Status: Widowed    Spouse Name: N/A    Number of Children: N/A  . Years of Education: N/A    Occupational History  . retired Administrator    Social History Main Topics  . Smoking status: Former Smoker -- 2.00 packs/day for 40 years    Types: Cigarettes    Quit date: 04/18/1983  . Smokeless tobacco: Former Systems developer    Types: Snuff, Chew     Comment: quit smoking cigarettes 1985; quit smokeless tobacco ~ 2011  . Alcohol Use: No  . Drug Use: No  . Sexual Activity: No   Other Topics Concern  . Not on file   Social History Narrative   The patient lives in [place]. He is widowed. 2 boys 2girls   He is a  retired Administrator   Patient is former smoker no tobacco use   Caffeine use - 3 cups     BP 88/52 mmHg  Pulse 61  Ht 5\' 9"  (1.753 m)  Wt 148 lb 4 oz (67.246 kg)  BMI 21.88 kg/m2  Physical Exam:  Stable appearing elderly man, NAD HEENT: Unremarkable Neck:  7 cm JVD, no thyromegally Lungs:  Clear with no wheezes, rales, or rhonchi. HEART:  Regular rate rhythm, no murmurs, no rubs, no clicks Abd:  soft, positive bowel sounds, no organomegally, no rebound, no guarding Ext:  2 plus pulses, no edema, no cyanosis, no clubbing Skin:  No rashes no nodules Neuro:  CN II through XII intact, motor grossly intact  DEVICE  Normal device function.  See PaceArt for details.   Assess/Plan:

## 2014-03-20 NOTE — Patient Instructions (Signed)
Your physician wants you to follow-up in: 12 months with Dr. Knox Saliva will receive a reminder letter in the mail two months in advance. If you don't receive a letter, please call our office to schedule the follow-up appointment.  Remote monitoring is used to monitor your Pacemaker of ICD from home. This monitoring reduces the number of office visits required to check your device to one time per year. It allows [place] to keep an eye on the functioning of your device to ensure it is working properly. You are scheduled for a device check from home on 06/19/14. You may send your transmission at any time that day. If you have a wireless device, the transmission will be sent automatically. After your physician reviews your transmission, you will receive a postcard with your next transmission date.

## 2014-03-21 ENCOUNTER — Encounter (HOSPITAL_COMMUNITY): Payer: Self-pay | Admitting: Internal Medicine

## 2014-03-25 ENCOUNTER — Other Ambulatory Visit: Payer: Self-pay | Admitting: Internal Medicine

## 2014-03-26 ENCOUNTER — Telehealth: Payer: Self-pay | Admitting: Internal Medicine

## 2014-03-26 ENCOUNTER — Ambulatory Visit (HOSPITAL_COMMUNITY): Payer: Medicare HMO

## 2014-03-26 NOTE — Telephone Encounter (Signed)
New message      Did we get the form to clear pt to start radiation?  Pt has a pacemaker

## 2014-03-27 NOTE — Telephone Encounter (Signed)
Form was signed & faxed 03/26/14.

## 2014-04-02 ENCOUNTER — Encounter: Payer: Self-pay | Admitting: Internal Medicine

## 2014-04-10 ENCOUNTER — Ambulatory Visit: Payer: Medicare HMO | Admitting: Radiation Oncology

## 2014-04-16 ENCOUNTER — Ambulatory Visit: Payer: Medicare HMO | Admitting: Radiation Oncology

## 2014-04-25 ENCOUNTER — Telehealth: Payer: Self-pay | Admitting: Internal Medicine

## 2014-04-25 NOTE — Telephone Encounter (Signed)
New message     Daughter thinks pt took all 4 sotalol 80mg  pills at one time sometimes this am after 11:00

## 2014-04-25 NOTE — Telephone Encounter (Signed)
Pt daughter, [name], called worried that father has taken morning and evening dose of Betapace.  Pt is alert and feels normal at time, unable to speak with pt as he had just went into the bathroom.  Pt had been to [place] ED last evening for GI issues and arrived home early this morning.   Pt woke up late this morning, after 11am and daughter noticed that both morning and evening dose of medication since all were missing from AM and PM pill box.  Pt could not remember if he took medication all this morning or not. Daughter is worried of overdose and wants to know what to do next.  Daughter instructed that since she and pt are unaware of amount of medication he has taken to be aware of decrease in blood pressure, dizziness, and becoming very lethargic.  Recommended she check pt BP and HR now and keep an eye on it throughout today. If pt starts to have dizziness, especially when standing, to sit back down and check BP.  Pt should move slowly. If pt starts to show all or some of these symptoms she should BP and HR and/or call EMS if feels it is nessciary .  Daughter state pt BP can sometimes below 887 systolic anyway, educated that if becomes symptomatic that is need for concern. She can also call our office again any time if she has concerns.  Recommended pt not take night time dose as we are unsure of amount taken this morning and to start back on regular schedule tomorrow.  Daughter agreed and stated understanding.

## 2014-04-30 ENCOUNTER — Ambulatory Visit
Admission: RE | Admit: 2014-04-30 | Discharge: 2014-04-30 | Disposition: A | Discharge status: Home / Self Care | Payer: Medicare HMO | Source: Ambulatory Visit | Attending: Radiation Oncology | Admitting: Radiation Oncology

## 2014-04-30 DIAGNOSIS — R918 Other nonspecific abnormal finding of lung field: Secondary | ICD-10-CM

## 2014-04-30 DIAGNOSIS — C3411 Malignant neoplasm of upper lobe, right bronchus or lung: Secondary | ICD-10-CM | POA: Diagnosis not present

## 2014-04-30 NOTE — Progress Notes (Signed)
[facility] Radiation Oncology Simulation and Treatment Planning Note   Name: George Acosta MRN: [id number]  Date: 04/30/2014  DOB: 05-Jun-1928  Status: outpatient  DIAGNOSIS: The encounter diagnosis was Lung mass RUL 1.1x 1.2 cm .  SIDE: right  CONSENT VERIFIED: yes  SET UP AND IMMOBILIZATION: Patient is setup supine in a vac loc with a custom moldable pillow for head and neck immobilization   NARRATIVE: The patient was brought to the [facility].  Identity was confirmed.  All relevant records and images related to the planned course of therapy were reviewed.  Then, the patient was positioned in a stable reproducible clinical set-up for radiation therapy.  CT images were obtained.  Skin markings were placed.  A four dimensional simulation was then performed to track tumor movement throughout the patients' breathing cycle. The CT images were loaded into the planning software where the target and avoidance structures were contoured.  The GTV was outlined on the free breathing, 4D and MIP image sets.  The radiation prescription was entered and confirmed.   TREATMENT PLANNING NOTE:  Treatment planning then occurred. I have requested 3D simulation with [facility] of the spinal cord, total lungs and gross tumor volume. I have also requested mlcs and an isodose plan.   Special treatment procedure will be performed as [name] will be receiving high dose per fraction.

## 2014-05-09 ENCOUNTER — Ambulatory Visit: Payer: Medicare HMO | Admitting: Radiation Oncology

## 2014-05-09 DIAGNOSIS — C3411 Malignant neoplasm of upper lobe, right bronchus or lung: Secondary | ICD-10-CM | POA: Diagnosis not present

## 2014-05-13 ENCOUNTER — Ambulatory Visit: Payer: Medicare HMO

## 2014-05-14 ENCOUNTER — Ambulatory Visit
Admission: RE | Admit: 2014-05-14 | Discharge: 2014-05-14 | Disposition: A | Discharge status: Home / Self Care | Payer: Medicare HMO | Source: Ambulatory Visit | Attending: Radiation Oncology | Admitting: Radiation Oncology

## 2014-05-14 DIAGNOSIS — R918 Other nonspecific abnormal finding of lung field: Secondary | ICD-10-CM

## 2014-05-14 DIAGNOSIS — C3411 Malignant neoplasm of upper lobe, right bronchus or lung: Secondary | ICD-10-CM | POA: Diagnosis not present

## 2014-05-14 NOTE — Progress Notes (Signed)
  Radiation Oncology         [id number]) [phone] ________________________________  Name: George Acosta MRN: [id number]  Date: 05/14/2014  DOB: 1929-03-03  Stereotactic Body Radiotherapy Treatment Procedure Note (12 Gy of planned 60 Gy)  NARRATIVE:  George Acosta was brought to the stereotactic radiation treatment machine and placed supine on the CT couch. The patient was set up for stereotactic body radiotherapy on the body fix pillow.  3D TREATMENT PLANNING AND DOSIMETRY:  The patient's radiation plan was reviewed and approved prior to starting treatment.  It showed 3-dimensional radiation distributions overlaid onto the planning CT.  The [facility] for the target structures as well as the organs at risk were reviewed. The documentation of this is filed in the radiation oncology EMR.  SIMULATION VERIFICATION:  The patient underwent CT imaging on the treatment unit.  These were carefully aligned to document that the ablative radiation dose would cover the target volume and maximally spare the nearby organs at risk according to the planned distribution.  SPECIAL TREATMENT PROCEDURE: George Acosta received high dose ablative stereotactic body radiotherapy to the planned target volume without unforeseen complications. Treatment was delivered uneventfully. The high doses associated with stereotactic body radiotherapy and the significant potential risks require careful treatment set up and patient monitoring constituting a special treatment procedure   STEREOTACTIC TREATMENT MANAGEMENT:  Following delivery, the patient was evaluated clinically. The patient tolerated treatment without significant acute effects, and was discharged to home in stable condition.    PLAN: Continue treatment as planned.  ________________________________  Blair Promise, PhD, MD

## 2014-05-16 ENCOUNTER — Ambulatory Visit
Admission: RE | Admit: 2014-05-16 | Discharge: 2014-05-16 | Disposition: A | Discharge status: Home / Self Care | Payer: Medicare HMO | Source: Ambulatory Visit | Attending: Radiation Oncology | Admitting: Radiation Oncology

## 2014-05-16 DIAGNOSIS — C3411 Malignant neoplasm of upper lobe, right bronchus or lung: Secondary | ICD-10-CM | POA: Diagnosis not present

## 2014-05-16 NOTE — Progress Notes (Signed)
  Radiation Oncology         [phone]) [phone] ________________________________  Name: George Acosta MRN: [id number]  Date: 05/16/2014  DOB: Jan 18, 1929  Stereotactic Body Radiotherapy Treatment Procedure Note  NARRATIVE:  George Acosta was brought to the stereotactic radiation treatment machine and placed supine on the CT couch. The patient was set up for stereotactic body radiotherapy on the body fix pillow.  3D TREATMENT PLANNING AND DOSIMETRY:  The patient's radiation plan was reviewed and approved prior to starting treatment.  It showed 3-dimensional radiation distributions overlaid onto the planning CT.  The [facility] for the target structures as well as the organs at risk were reviewed. The documentation of this is filed in the radiation oncology EMR.  SIMULATION VERIFICATION:  The patient underwent CT imaging on the treatment unit.  These were carefully aligned to document that the ablative radiation dose would cover the target volume and maximally spare the nearby organs at risk according to the planned distribution.  SPECIAL TREATMENT PROCEDURE: George Acosta received high dose ablative stereotactic body radiotherapy to the planned target volume without unforeseen complications. Treatment was delivered uneventfully. The high doses associated with stereotactic body radiotherapy and the significant potential risks require careful treatment set up and patient monitoring constituting a special treatment procedure   STEREOTACTIC TREATMENT MANAGEMENT:  Following delivery, the patient was evaluated clinically. The patient tolerated treatment without significant acute effects, and was discharged to home in stable condition.    PLAN: Continue treatment as planned.  _________________________   Thea Silversmith, MD

## 2014-05-20 ENCOUNTER — Ambulatory Visit
Admission: RE | Admit: 2014-05-20 | Discharge: 2014-05-20 | Disposition: A | Discharge status: Home / Self Care | Payer: Medicare HMO | Source: Ambulatory Visit | Attending: Radiation Oncology | Admitting: Radiation Oncology

## 2014-05-20 ENCOUNTER — Encounter: Payer: Self-pay | Admitting: Radiation Oncology

## 2014-05-20 DIAGNOSIS — C3411 Malignant neoplasm of upper lobe, right bronchus or lung: Secondary | ICD-10-CM | POA: Diagnosis not present

## 2014-05-20 NOTE — Progress Notes (Signed)
  Radiation Oncology         [id number]) [phone] ________________________________  Name: Seena Face Rodriges MRN: [id number]  Date: 05/20/2014  DOB: 01-24-1929  Stereotactic Body Radiotherapy Treatment Procedure Note  Right upper lung  NARRATIVE:  Meryl A Morrone was brought to the stereotactic radiation treatment machine and placed supine on the CT couch. The patient was set up for stereotactic body radiotherapy on the body fix pillow.  3D TREATMENT PLANNING AND DOSIMETRY:  The patient's radiation plan was reviewed and approved prior to starting treatment.  It showed 3-dimensional radiation distributions overlaid onto the planning CT.  The [facility] for the target structures as well as the organs at risk were reviewed. The documentation of this is filed in the radiation oncology EMR.  SIMULATION VERIFICATION:  The patient underwent CT imaging on the treatment unit.  These were carefully aligned to document that the ablative radiation dose would cover the target volume and maximally spare the nearby organs at risk according to the planned distribution.  SPECIAL TREATMENT PROCEDURE: Laderius A Walsh received high dose ablative stereotactic body radiotherapy to the planned target volume without unforeseen complications. Treatment was delivered uneventfully. The high doses associated with stereotactic body radiotherapy and the significant potential risks require careful treatment set up and patient monitoring constituting a special treatment procedure   STEREOTACTIC TREATMENT MANAGEMENT:  Following delivery, the patient was evaluated clinically. The patient tolerated treatment without significant acute effects, and was discharged to home in stable condition.    PLAN: Continue treatment as planned.  ________________________________  Eppie Gibson, MD

## 2014-05-21 ENCOUNTER — Ambulatory Visit: Payer: Medicare HMO | Admitting: Radiation Oncology

## 2014-05-21 DIAGNOSIS — C3411 Malignant neoplasm of upper lobe, right bronchus or lung: Secondary | ICD-10-CM | POA: Diagnosis not present

## 2014-05-22 ENCOUNTER — Ambulatory Visit
Admission: RE | Admit: 2014-05-22 | Discharge: 2014-05-22 | Disposition: A | Discharge status: Home / Self Care | Payer: Medicare HMO | Source: Ambulatory Visit | Attending: Radiation Oncology | Admitting: Radiation Oncology

## 2014-05-22 DIAGNOSIS — C3411 Malignant neoplasm of upper lobe, right bronchus or lung: Secondary | ICD-10-CM | POA: Diagnosis not present

## 2014-05-22 DIAGNOSIS — R918 Other nonspecific abnormal finding of lung field: Secondary | ICD-10-CM

## 2014-05-22 NOTE — Progress Notes (Signed)
  Radiation Oncology         [phone]) [phone] ________________________________  Name: Arnulfo Batson Steil MRN: [id number]  Date: 05/22/2014  DOB: 15-Nov-1928  Stereotactic Body Radiotherapy Treatment Procedure Note (48 Gy of planned 60 Gy)  Right upper lung  NARRATIVE:  Heliodoro A Kober was brought to the stereotactic radiation treatment machine and placed supine on the CT couch. The patient was set up for stereotactic body radiotherapy on the body fix pillow.  3D TREATMENT PLANNING AND DOSIMETRY:  The patient's radiation plan was reviewed and approved prior to starting treatment.  It showed 3-dimensional radiation distributions overlaid onto the planning CT.  The [facility] for the target structures as well as the organs at risk were reviewed. The documentation of this is filed in the radiation oncology EMR.  SIMULATION VERIFICATION:  The patient underwent CT imaging on the treatment unit.  These were carefully aligned to document that the ablative radiation dose would cover the target volume and maximally spare the nearby organs at risk according to the planned distribution.  SPECIAL TREATMENT PROCEDURE: Gaetan A [place] received high dose ablative stereotactic body radiotherapy to the planned target volume without unforeseen complications. Treatment was delivered uneventfully. The high doses associated with stereotactic body radiotherapy and the significant potential risks require careful treatment set up and patient monitoring constituting a special treatment procedure   STEREOTACTIC TREATMENT MANAGEMENT:  Following delivery, the patient was evaluated clinically. The patient tolerated treatment without significant acute effects, and was discharged to home in stable condition.    PLAN: Continue treatment as planned.  ________________________________  Blair Promise, PhD, MD

## 2014-05-24 ENCOUNTER — Ambulatory Visit
Admission: RE | Admit: 2014-05-24 | Discharge: 2014-05-24 | Disposition: A | Discharge status: Home / Self Care | Payer: Medicare HMO | Source: Ambulatory Visit | Attending: Radiation Oncology | Admitting: Radiation Oncology

## 2014-05-24 ENCOUNTER — Encounter: Payer: Self-pay | Admitting: Radiation Oncology

## 2014-05-24 VITALS — BP 93/57 | HR 70 | Temp 97.4°F | Resp 18 | Wt 147.1 lb

## 2014-05-24 DIAGNOSIS — R918 Other nonspecific abnormal finding of lung field: Secondary | ICD-10-CM

## 2014-05-24 DIAGNOSIS — C3411 Malignant neoplasm of upper lobe, right bronchus or lung: Secondary | ICD-10-CM | POA: Diagnosis not present

## 2014-05-24 MED ORDER — RADIAPLEXRX EX GEL
Freq: Once | CUTANEOUS | Status: AC
  Administered 2014-05-24: 16:00:00 via TOPICAL

## 2014-05-24 NOTE — Progress Notes (Signed)
Patient for assessment of completion of SBRT to right upper lung.Denies pain.States he just has generalized fatigue with old age but no different prior to starting treatment.Coughs up thick white sputum at times.No skin changes.Push fluids and add ensure shake daily.follow up in one month.

## 2014-05-24 NOTE — Addendum Note (Signed)
Encounter addended by: Arlyss Repress, RN on: 05/24/2014  3:33 PM<BR>     Documentation filed: [employer], Orders

## 2014-05-24 NOTE — Progress Notes (Signed)
  Radiation Oncology         [phone]) [phone] ________________________________  Name: George Acosta MRN: [id number]  Date: 05/24/2014  DOB: 1928/12/01  Stereotactic Body Radiotherapy Treatment Procedure Note  Right upper lung Outpatient  NARRATIVE:  George Acosta was brought to the stereotactic radiation treatment machine and placed supine on the CT couch. The patient was set up for stereotactic body radiotherapy on the body fix pillow.  3D TREATMENT PLANNING AND DOSIMETRY:  The patient's radiation plan was reviewed and approved prior to starting treatment.  It showed 3-dimensional radiation distributions overlaid onto the planning CT.  The [facility] for the target structures as well as the organs at risk were reviewed. The documentation of this is filed in the radiation oncology EMR.  SIMULATION VERIFICATION:  The patient underwent CT imaging on the treatment unit.  These were carefully aligned to document that the ablative radiation dose would cover the target volume and maximally spare the nearby organs at risk according to the planned distribution.  SPECIAL TREATMENT PROCEDURE: George Acosta received high dose ablative stereotactic body radiotherapy to the planned target volume without unforeseen complications. Treatment was delivered uneventfully. The high doses associated with stereotactic body radiotherapy and the significant potential risks require careful treatment set up and patient monitoring constituting a special treatment procedure   STEREOTACTIC TREATMENT MANAGEMENT:  Following delivery, the patient was evaluated clinically. The patient tolerated treatment without significant acute effects, and was discharged to home in stable condition.    PLAN: f/u in 48month  ________________________________  Eppie Gibson, MD

## 2014-05-24 NOTE — Progress Notes (Signed)
   Weekly Management Note:  Outpatient  Lung mass, right upper lung  Current Dose:  60 Gy  Projected Dose: 60 Gy   Narrative:  The patient presents for routine under treatment assessment.  CBCT/MVCT images/Port film x-rays were reviewed.  The chart was checked. No new complaints.   Physical Findings:  weight is 147 lb 1.6 oz (66.724 kg). His temperature is 97.4 F (36.3 C). His blood pressure is 93/57 and his pulse is 70. His respiration is 18 and oxygen saturation is 99%.   Wt Readings from Last 3 Encounters:  05/24/14 147 lb 1.6 oz (66.724 kg)  03/20/14 148 lb 4 oz (67.246 kg)  02/27/14 140 lb (63.504 kg)   Lungs CTAB  Impression:  The patient has tolerated radiotherapy.  Plan:  F/u in 1 month  ________________________________   Eppie Gibson, M.D.

## 2014-05-24 NOTE — Addendum Note (Signed)
Encounter addended by: Arlyss Repress, RN on: 05/24/2014  3:42 PM<BR>     Documentation filed: Inpatient MAR

## 2014-05-28 NOTE — Progress Notes (Signed)
  Radiation Oncology         [phone]) [phone] ________________________________  Name: George Acosta MRN: [id number]  Date: 05/24/2014  DOB: 1928/09/01  End of Treatment Note  Diagnosis:   Stage I NSCLC of the right upper lobe  Indication for treatment:  Curative    Radiation treatment dates:  05/14/2014, 05/16/2014, 05/20/2014, 05/22/2014, 05/24/2014  Site/dose:   Right upper lobe / 60 Gy in 5 fractions  Beams/energy:   VMAT with daily cone beam CT and 6 MV photons  Narrative: The patient tolerated radiation treatment relatively well.   He had some dysphagia after treatment which was treated with carafate.   Plan: The patient has completed radiation treatment. The patient will return to radiation oncology clinic for routine followup in one month. I advised them to call or return sooner if they have any questions or concerns related to their recovery or treatment.  ------------------------------------------------  Thea Silversmith, MD

## 2014-05-31 ENCOUNTER — Telehealth: Payer: Self-pay

## 2014-05-31 ENCOUNTER — Other Ambulatory Visit: Payer: Self-pay | Admitting: Radiation Oncology

## 2014-05-31 DIAGNOSIS — R918 Other nonspecific abnormal finding of lung field: Secondary | ICD-10-CM

## 2014-05-31 MED ORDER — SUCRALFATE 1 GM/10ML PO SUSP: 1.0000 g | Freq: Three times a day (TID) | ORAL | Status: DC

## 2014-05-31 NOTE — Telephone Encounter (Signed)
Spoke with patients daughter.patient is having pain on swallowing.reinforced to eat soft diet and avoid acidic foods/drinks.Uses [facility] in [place]. [phone].

## 2014-06-19 ENCOUNTER — Ambulatory Visit (INDEPENDENT_AMBULATORY_CARE_PROVIDER_SITE_OTHER): Payer: Medicare HMO | Admitting: *Deleted

## 2014-06-19 DIAGNOSIS — I255 Ischemic cardiomyopathy: Secondary | ICD-10-CM

## 2014-06-19 LAB — MDC_IDC_ENUM_SESS_TYPE_REMOTE
Date Time Interrogation Session: 20160309050200
HIGH POWER IMPEDANCE MEASURED VALUE: 66 Ohm
Implantable Pulse Generator Serial Number: 102353
Lead Channel Impedance Value: 514 Ohm
Lead Channel Pacing Threshold Amplitude: 0.7 V
Lead Channel Pacing Threshold Pulse Width: 0.4 ms
Lead Channel Pacing Threshold Pulse Width: 0.4 ms
Lead Channel Sensing Intrinsic Amplitude: 11.2 mV
Lead Channel Setting Pacing Amplitude: 2 V
Lead Channel Setting Pacing Amplitude: 2.4 V
Lead Channel Setting Pacing Pulse Width: 0.4 ms
MDC IDC MSMT BATTERY REMAINING LONGEVITY: 114 mo
MDC IDC MSMT BATTERY REMAINING PERCENTAGE: 100 %
MDC IDC MSMT LEADCHNL RA IMPEDANCE VALUE: 495 Ohm
MDC IDC MSMT LEADCHNL RA PACING THRESHOLD AMPLITUDE: 0.9 V
MDC IDC MSMT LEADCHNL RA SENSING INTR AMPL: 1.2 mV
MDC IDC SET LEADCHNL RV SENSING SENSITIVITY: 0.5 mV
MDC IDC STAT BRADY RA PERCENT PACED: 12 %
MDC IDC STAT BRADY RV PERCENT PACED: 1 %
Zone Setting Detection Interval: 273 ms
Zone Setting Detection Interval: 316 ms
Zone Setting Detection Interval: 429 ms

## 2014-06-19 NOTE — Progress Notes (Signed)
Remote ICD transmission.   

## 2014-06-27 ENCOUNTER — Encounter: Payer: Self-pay | Admitting: Cardiology

## 2014-07-03 ENCOUNTER — Encounter: Payer: Self-pay | Admitting: Internal Medicine

## 2014-07-04 ENCOUNTER — Ambulatory Visit: Payer: Medicare HMO | Admitting: Radiation Oncology

## 2014-07-25 ENCOUNTER — Ambulatory Visit
Admission: RE | Admit: 2014-07-25 | Discharge: 2014-07-25 | Disposition: A | Discharge status: Home / Self Care | Payer: Medicare HMO | Source: Ambulatory Visit | Attending: Radiation Oncology | Admitting: Radiation Oncology

## 2014-07-25 VITALS — BP 125/58 | HR 125 | Temp 97.5°F | Resp 20 | Wt 145.5 lb

## 2014-07-25 DIAGNOSIS — R918 Other nonspecific abnormal finding of lung field: Secondary | ICD-10-CM

## 2014-07-26 ENCOUNTER — Telehealth: Payer: Self-pay | Admitting: *Deleted

## 2014-07-26 NOTE — Telephone Encounter (Signed)
CALLED PATIENT TO INFORM OF CT ON 08-09-14 @ 11 AM @ [facility] AND FU VISIT WITH DR. Bobby Rumpf ON 08-12-14 - ARRIVAL TIME - 3 PM, LVM FOR A RETURN CALL

## 2014-08-14 NOTE — Progress Notes (Signed)
   Department of Radiation Oncology  Phone:  [phone] Fax:        [phone]   Name: George Acosta MRN: [id number]  DOB: 10/30/1928  Date: 07/25/2014  Follow Up Visit Note  Diagnosis: NSCLC of the right upper lobe  Summary and Interval since last radiation: 2 months from SBRT to the right upper lobe to a total dose of 60 gy in 5 fractions  Interval History: George Acosta presents today for routine followup.  He is doing well. He has no complaints. His swallowing difficulties have resolved.  He would like to be followed by Dr. Bobby Rumpf . He is glad he decided to pursue treatment.   Physical Exam:  Filed Vitals:   07/25/14 1442  BP: 125/58  Pulse: 125  Temp: 97.5 F (36.4 C)  Resp: 20  Weight: 145 lb 8 oz (65.998 kg)  SpO2: 100%   Pleasant gentleman in no distress.   IMPRESSION: George Acosta is a 81 y.o. male s/p SBRT with resolving acute effects of treatment  PLAN:  Doing well. Scan and follow up with Dr. Bobby Rumpf per patient request..  I am happy to see him in a year with a scan if he desires or he can stay with Dr. Bobby Rumpf.  He will let us know.     Thea Silversmith, MD

## 2014-08-14 NOTE — Addendum Note (Signed)
Encounter addended by: Thea Silversmith, MD on: 08/14/2014  3:13 PM<BR>     Documentation filed: Demographics Visit, Notes Section

## 2014-08-19 ENCOUNTER — Encounter: Payer: Self-pay | Admitting: Gastroenterology

## 2014-09-18 ENCOUNTER — Encounter: Payer: Self-pay | Admitting: Internal Medicine

## 2014-09-18 ENCOUNTER — Ambulatory Visit (INDEPENDENT_AMBULATORY_CARE_PROVIDER_SITE_OTHER): Payer: Medicare HMO | Admitting: *Deleted

## 2014-09-18 DIAGNOSIS — I255 Ischemic cardiomyopathy: Secondary | ICD-10-CM | POA: Diagnosis not present

## 2014-09-18 NOTE — Progress Notes (Signed)
Remote ICD transmission.   

## 2014-09-20 ENCOUNTER — Telehealth: Payer: Self-pay | Admitting: Cardiology

## 2014-09-20 NOTE — Telephone Encounter (Signed)
spoke w/ pt daughter and she is aware that she does not have to press button once a week anymore.

## 2014-09-26 LAB — CUP PACEART REMOTE DEVICE CHECK
Battery Remaining Longevity: 108 mo
Battery Remaining Percentage: 100 %
Brady Statistic RA Percent Paced: 9 %
Date Time Interrogation Session: 20160608040200
HighPow Impedance: 73 Ohm
Lead Channel Impedance Value: 505 Ohm
Lead Channel Pacing Threshold Amplitude: 0.7 V
Lead Channel Pacing Threshold Pulse Width: 0.4 ms
Lead Channel Pacing Threshold Pulse Width: 0.4 ms
Lead Channel Setting Pacing Amplitude: 2 V
Lead Channel Setting Pacing Pulse Width: 0.4 ms
Lead Channel Setting Sensing Sensitivity: 0.5 mV
MDC IDC MSMT LEADCHNL RA IMPEDANCE VALUE: 482 Ohm
MDC IDC MSMT LEADCHNL RA PACING THRESHOLD AMPLITUDE: 0.9 V
MDC IDC PG SERIAL: 102353
MDC IDC SET LEADCHNL RV PACING AMPLITUDE: 2.4 V
MDC IDC SET ZONE DETECTION INTERVAL: 316 ms
MDC IDC SET ZONE DETECTION INTERVAL: 429 ms
MDC IDC STAT BRADY RV PERCENT PACED: 2 %
Zone Setting Detection Interval: 273 ms

## 2014-09-30 ENCOUNTER — Encounter: Payer: Self-pay | Admitting: Cardiology

## 2014-12-23 ENCOUNTER — Ambulatory Visit (INDEPENDENT_AMBULATORY_CARE_PROVIDER_SITE_OTHER): Payer: Medicare HMO | Admitting: *Deleted

## 2014-12-23 ENCOUNTER — Encounter: Payer: Self-pay | Admitting: Internal Medicine

## 2014-12-23 DIAGNOSIS — I4729 Other ventricular tachycardia: Secondary | ICD-10-CM

## 2014-12-23 DIAGNOSIS — I472 Ventricular tachycardia: Secondary | ICD-10-CM

## 2014-12-23 NOTE — Progress Notes (Signed)
Remote ICD transmission.   

## 2014-12-30 LAB — CUP PACEART REMOTE DEVICE CHECK
Battery Remaining Longevity: 108 mo
Date Time Interrogation Session: 20160912045200
HIGH POWER IMPEDANCE MEASURED VALUE: 73 Ohm
Lead Channel Impedance Value: 485 Ohm
Lead Channel Sensing Intrinsic Amplitude: 1 mV
Lead Channel Sensing Intrinsic Amplitude: 13.4 mV
Lead Channel Setting Pacing Amplitude: 2 V
Lead Channel Setting Pacing Amplitude: 2.4 V
Lead Channel Setting Sensing Sensitivity: 0.5 mV
MDC IDC MSMT BATTERY REMAINING PERCENTAGE: 100 %
MDC IDC MSMT LEADCHNL RV IMPEDANCE VALUE: 496 Ohm
MDC IDC SET LEADCHNL RV PACING PULSEWIDTH: 0.4 ms
MDC IDC SET ZONE DETECTION INTERVAL: 273 ms
MDC IDC STAT BRADY RA PERCENT PACED: 9 %
MDC IDC STAT BRADY RV PERCENT PACED: 1 %
Pulse Gen Serial Number: 102353
Zone Setting Detection Interval: 316 ms
Zone Setting Detection Interval: 429 ms

## 2015-01-09 ENCOUNTER — Telehealth: Payer: Self-pay | Admitting: Internal Medicine

## 2015-01-10 ENCOUNTER — Telehealth: Payer: Self-pay | Admitting: *Deleted

## 2015-01-10 ENCOUNTER — Encounter: Payer: Self-pay | Admitting: Cardiology

## 2015-01-10 NOTE — Telephone Encounter (Signed)
EMS called office  yesterday regarding patient. Patient told EMS that he had been shocked 2 x's while getting out of bed. EMS wanted to know what to do. I explained to EMS that per protocol pt needs to go to the hospital for further evaluation. EMS voiced understanding.  Dr.Taylor informed of patient's status.

## 2015-01-13 ENCOUNTER — Encounter (HOSPITAL_COMMUNITY): Payer: Self-pay

## 2015-01-13 ENCOUNTER — Emergency Department (HOSPITAL_COMMUNITY)
Admission: EM | Admit: 2015-01-13 | Discharge: 2015-01-13 | Disposition: A | Discharge status: Home / Self Care | Payer: Medicare HMO | Attending: Emergency Medicine | Admitting: Emergency Medicine

## 2015-01-13 ENCOUNTER — Emergency Department (HOSPITAL_COMMUNITY): Payer: Medicare HMO

## 2015-01-13 ENCOUNTER — Encounter: Payer: Self-pay | Admitting: Internal Medicine

## 2015-01-13 DIAGNOSIS — Z4502 Encounter for adjustment and management of automatic implantable cardiac defibrillator: Secondary | ICD-10-CM

## 2015-01-13 DIAGNOSIS — Z87891 Personal history of nicotine dependence: Secondary | ICD-10-CM | POA: Insufficient documentation

## 2015-01-13 DIAGNOSIS — J189 Pneumonia, unspecified organism: Secondary | ICD-10-CM | POA: Diagnosis not present

## 2015-01-13 DIAGNOSIS — Y831 Surgical operation with implant of artificial internal device as the cause of abnormal reaction of the patient, or of later complication, without mention of misadventure at the time of the procedure: Secondary | ICD-10-CM | POA: Insufficient documentation

## 2015-01-13 DIAGNOSIS — Z8701 Personal history of pneumonia (recurrent): Secondary | ICD-10-CM | POA: Insufficient documentation

## 2015-01-13 DIAGNOSIS — J449 Chronic obstructive pulmonary disease, unspecified: Secondary | ICD-10-CM | POA: Insufficient documentation

## 2015-01-13 DIAGNOSIS — F419 Anxiety disorder, unspecified: Secondary | ICD-10-CM | POA: Insufficient documentation

## 2015-01-13 DIAGNOSIS — I252 Old myocardial infarction: Secondary | ICD-10-CM | POA: Insufficient documentation

## 2015-01-13 DIAGNOSIS — I2510 Atherosclerotic heart disease of native coronary artery without angina pectoris: Secondary | ICD-10-CM | POA: Insufficient documentation

## 2015-01-13 DIAGNOSIS — N400 Enlarged prostate without lower urinary tract symptoms: Secondary | ICD-10-CM | POA: Insufficient documentation

## 2015-01-13 DIAGNOSIS — Z8739 Personal history of other diseases of the musculoskeletal system and connective tissue: Secondary | ICD-10-CM | POA: Insufficient documentation

## 2015-01-13 DIAGNOSIS — I10 Essential (primary) hypertension: Secondary | ICD-10-CM | POA: Insufficient documentation

## 2015-01-13 DIAGNOSIS — I509 Heart failure, unspecified: Secondary | ICD-10-CM | POA: Insufficient documentation

## 2015-01-13 DIAGNOSIS — K219 Gastro-esophageal reflux disease without esophagitis: Secondary | ICD-10-CM | POA: Insufficient documentation

## 2015-01-13 DIAGNOSIS — Z79899 Other long term (current) drug therapy: Secondary | ICD-10-CM | POA: Insufficient documentation

## 2015-01-13 DIAGNOSIS — J440 Chronic obstructive pulmonary disease with acute lower respiratory infection: Secondary | ICD-10-CM | POA: Diagnosis not present

## 2015-01-13 DIAGNOSIS — T82198A Other mechanical complication of other cardiac electronic device, initial encounter: Secondary | ICD-10-CM | POA: Insufficient documentation

## 2015-01-13 DIAGNOSIS — R05 Cough: Secondary | ICD-10-CM | POA: Diagnosis not present

## 2015-01-13 LAB — I-STAT TROPONIN, ED: TROPONIN I, POC: 0.01 ng/mL (ref 0.00–0.08)

## 2015-01-13 LAB — CBC
HEMATOCRIT: 33.7 % — AB (ref 39.0–52.0)
HEMOGLOBIN: 11.1 g/dL — AB (ref 13.0–17.0)
MCH: 31.6 pg (ref 26.0–34.0)
MCHC: 32.9 g/dL (ref 30.0–36.0)
MCV: 96 fL (ref 78.0–100.0)
Platelets: 224 10*3/uL (ref 150–400)
RBC: 3.51 MIL/uL — ABNORMAL LOW (ref 4.22–5.81)
RDW: 14 % (ref 11.5–15.5)
WBC: 5.9 10*3/uL (ref 4.0–10.5)

## 2015-01-13 LAB — BASIC METABOLIC PANEL
ANION GAP: 8 (ref 5–15)
BUN: 16 mg/dL (ref 6–20)
CO2: 27 mmol/L (ref 22–32)
Calcium: 8.9 mg/dL (ref 8.9–10.3)
Chloride: 97 mmol/L — ABNORMAL LOW (ref 101–111)
Creatinine, Ser: 0.84 mg/dL (ref 0.61–1.24)
GFR calc Af Amer: >60 mL/min (ref >60)
GLUCOSE: 115 mg/dL — AB (ref 65–99)
POTASSIUM: 4.1 mmol/L (ref 3.5–5.1)
SODIUM: 132 mmol/L — AB (ref 135–145)

## 2015-01-13 LAB — PROTIME-INR
INR: 1.22 (ref 0.00–1.49)
Prothrombin Time: 15.5 seconds — ABNORMAL HIGH (ref 11.6–15.2)

## 2015-01-13 NOTE — ED Notes (Signed)
Pt here for " chest pain " sts he was laying in bed awake and felt a feeling like his defibrillator went off and then pain subsided, was pain free enroute and then when pulling in had another episode, now pain free. sts hx of same last week and seen at [facility]

## 2015-01-13 NOTE — ED Notes (Signed)
Dual chamber ICD check.  Patient believed he was shocked, but no episodes appear on device.  Lead function is normal.  Sinus rhythm. Patient was shocked on Sept 29, 2016 and rep did check him.  Heron Sabins Deakins [phone] [employer]

## 2015-01-13 NOTE — ED Provider Notes (Signed)
CSN: [id number]     Arrival date & time 01/13/15  1062 History  By signing my name below, I, Sonum Patel, attest that this documentation has been prepared under the direction and in the presence of Veryl Speak, MD. Electronically Signed: Sonum Patel, Education administrator. 01/13/2015. 3:48 AM.     Chief Complaint  Patient presents with  . Chest Pain   The history is provided by the patient and the EMS personnel. No language interpreter was used.     HPI Comments: George Acosta is a 81 y.o. male with past medical history of CAD, CHF, MI who presents to the Emergency Department complaining his defibrillator going off 3 times in the last 2 weeks with the most recent episode occurring tonight. He states he was lying in bed when this occurred. He denies any pain or symptoms currently. He denies CP, SOB, abdominal pain, palpitations.   Past Medical History  Diagnosis Date  . Coronary atherosclerosis of unspecified type of vessel, native or graft   . Unspecified essential hypertension   . Benign prostatic hypertrophy   . Peptic ulcer disease   . Diverticulosis   . GERD (gastroesophageal reflux disease)   . Ischemic cardiomyopathy   . Congestive heart failure, unspecified   . Arrhythmia     paroxysmal afib  . Ventricular tachycardia ([place])     A.   monomorphic VT  B. 04/2011 s/p generator change - [employer] ICD  [phone]  . MI (myocardial infarction) ([place]) 1985    "big MI after I got to hospital"  . Acute myocardial infarction, unspecified site, episode of care unspecified 1985    "on way to hospital"  . Pneumonia 1980's  . Anxiety   . Pancreatitis 1980's  . Paroxysmal atrial fibrillation ([facility])   . AICD (automatic cardioverter/defibrillator) present     [employer]  . Presence of permanent cardiac pacemaker   . COPD (chronic obstructive pulmonary disease) ([place])   . SOB (shortness of breath)     not recently  . Arthritis   . Lung mass   . Radiation 2/2,2/4,2/8,2/10,05/24/14   Right upper lobe 60 Gy in 5 fractions   Past Surgical History  Procedure Laterality Date  . Icd  2001; 2007; 04/28/11    generator change; complete replacement; lead replacement  . Laparotomy  11/2005    for small bowel obstruction,  . Cardiac catheterization  11/23/2005  . Cardiac defibrillator placement  1996  . Cholecystectomy  1980's  . Cataract extraction  2012    left eye  . Video bronchoscopy with endobronchial navigation Right 02/27/2014    Procedure: VIDEO BRONCHOSCOPY WITH ENDOBRONCHIAL NAVIGATION,insertion Feducial marker right upper lobe lung;  Surgeon: Collene Gobble, MD;  Location: [place];  Service: Thoracic;  Laterality: Right;  . Implantable cardioverter defibrillator (icd) generator change N/A 04/28/2011    Procedure: ICD GENERATOR CHANGE;  Surgeon: Evans Lance, MD;  Location: [facility] CATH LAB;  Service: Cardiovascular;  Laterality: N/A;  . Lead revision N/A 04/28/2011    Procedure: LEAD REVISION;  Surgeon: Evans Lance, MD;  Location: [facility] CATH LAB;  Service: Cardiovascular;  Laterality: N/A;   Family History  Problem Relation Age of Onset  . Heart disease Mother   . Heart disease Daughter   . Cancer Daughter    Social History  Substance Use Topics  . Smoking status: Former Smoker -- 2.00 packs/day for 40 years    Types: Cigarettes    Quit date: 04/18/1983  . Smokeless tobacco:  Former Systems developer    Types: Snuff, Chew     Comment: quit smoking cigarettes 1985; quit smokeless tobacco ~ 2011  . Alcohol Use: No    Review of Systems  Constitutional:       +defibrillator going off   Respiratory: Negative for shortness of breath.   Cardiovascular: Negative for chest pain and palpitations.  All other systems reviewed and are negative.     Allergies  Sulfonamide derivatives  Home Medications   Prior to Admission medications   Medication Sig Start Date End Date Taking? Authorizing Provider  apixaban (ELIQUIS) 5 MG TABS tablet Take 5 mg by mouth 2 (two) times daily.     Historical Provider, MD  furosemide (LASIX) 20 MG tablet Take 30 mg by mouth daily.     Historical Provider, MD  loratadine (CLARITIN) 10 MG tablet Take 10 mg by mouth daily.    Historical Provider, MD  LORazepam (ATIVAN) 0.5 MG tablet Take 0.25 mg by mouth 2 (two) times daily.    Historical Provider, MD  Multiple Vitamin (MULTIVITAMIN WITH MINERALS) TABS Take 1 tablet by mouth daily.    Historical Provider, MD  nitroGLYCERIN (NITROSTAT) 0.4 MG SL tablet Place 1 tablet (0.4 mg total) under the tongue every 5 (five) minutes as needed. For chest pain Patient taking differently: Place 0.4 mg under the tongue every 5 (five) minutes as needed for chest pain.  03/16/13   Evans Lance, MD  oxycodone (OXY-IR) 5 MG capsule Take 5 mg by mouth every 4 (four) hours as needed.    Historical Provider, MD  pantoprazole (PROTONIX) 40 MG tablet Take 40 mg by mouth daily.     Historical Provider, MD  potassium chloride SA (K-DUR,KLOR-CON) 20 MEQ tablet Take 20 mEq by mouth daily.      Historical Provider, MD  ranitidine (ZANTAC) 150 MG tablet Take 75 mg by mouth at bedtime.     Historical Provider, MD  sotalol (BETAPACE) 80 MG tablet TAKE 2 TABLETS TWICE DAILY. Patient taking differently: TAKE  1  TABLETS TWICE DAILY. 03/26/14   Evans Lance, MD  sucralfate (CARAFATE) 1 GM/10ML suspension Take 10 mLs (1 g total) by mouth 4 (four) times daily -  with meals and at bedtime. 05/31/14   Thea Silversmith, MD   BP 110/66 mmHg  Pulse 67  Temp(Src) 98.2 F (36.8 C) (Oral)  Resp 17  Ht '5\' 10"'$  (1.778 m)  Wt 130 lb (58.968 kg)  BMI 18.65 kg/m2  SpO2 96% Physical Exam  Constitutional: He is oriented to person, place, and time. He appears well-developed and well-nourished.  HENT:  Head: Normocephalic and atraumatic.  Eyes: EOM are normal.  Neck: Normal range of motion.  Cardiovascular: Normal rate, regular rhythm, normal heart sounds and intact distal pulses.   Pulmonary/Chest: Effort normal and breath sounds  normal. No respiratory distress.  Abdominal: Soft. He exhibits no distension. There is no tenderness.  Musculoskeletal: Normal range of motion.  Neurological: He is alert and oriented to person, place, and time.  Skin: Skin is warm and dry.  Psychiatric: He has a normal mood and affect. Judgment normal.  Nursing note and vitals reviewed.   ED Course  Procedures (including critical care time)  DIAGNOSTIC STUDIES: Oxygen Saturation is 96% on RA, adequate by my interpretation.    COORDINATION OF CARE: 3:54 AM Discussed treatment plan with pt at bedside and pt agreed to plan.   Labs Review Labs Reviewed  BASIC METABOLIC PANEL  CBC  PROTIME-INR  Imaging Review No results found. I have personally reviewed and evaluated these images and lab results as part of my medical decision-making.   EKG Interpretation   Date/Time:  Monday January 13 2015 03:31:17 EDT Ventricular Rate:  70 PR Interval:    QRS Duration: 111 QT Interval:  434 QTC Calculation: 468 R Axis:   104 Text Interpretation:  Atrial fibrillation Right axis deviation Abnormal T,  consider ischemia, diffuse leads Confirmed by Jaydan Chretien  MD, Hadia Minier ([id number]) on  01/13/2015 3:39:10 AM      MDM   Final diagnoses:  None    Patient presents over concerns he may have been shocked by his defibrillator. His device was interrogated and no shock was given this evening. He was shocked 2 weeks ago for an apparent rapid episode of A. fib. This was evaluated at [facility] and was known to his cardiologist. No changes were made at that time. As this episode was not a real shock, high do not feel as though any further evaluation is necessary. He is to follow-up with his cardiologist in the next week. Laboratory studies today are unremarkable and his EKG is unchanged.  Allena Napoleon, personally performed the services described in this documentation. All medical record entries made by the scribe were at my direction and in  my presence.  I have reviewed the chart and discharge instructions and agree that the record reflects my personal performance and is accurate and complete. Veryl Speak.  01/13/2015. 5:04 AM.        Veryl Speak, MD 01/13/15 [phone]

## 2015-01-13 NOTE — ED Notes (Signed)
[employer] rep here to do pacemaker interrogation.

## 2015-01-13 NOTE — Discharge Instructions (Signed)
Follow-up with your cardiologist in the next week, and return to the ER if you develop any new or concerning symptoms.

## 2015-01-15 ENCOUNTER — Emergency Department (HOSPITAL_COMMUNITY): Payer: Medicare HMO

## 2015-01-15 ENCOUNTER — Inpatient Hospital Stay (HOSPITAL_COMMUNITY)
Admission: EM | Admit: 2015-01-15 | Discharge: 2015-01-21 | DRG: 190 | Disposition: A | Discharge status: Home Health | Payer: Medicare HMO | Attending: Internal Medicine | Admitting: Internal Medicine

## 2015-01-15 ENCOUNTER — Encounter (HOSPITAL_COMMUNITY): Payer: Self-pay | Admitting: Emergency Medicine

## 2015-01-15 DIAGNOSIS — Z923 Personal history of irradiation: Secondary | ICD-10-CM

## 2015-01-15 DIAGNOSIS — Z955 Presence of coronary angioplasty implant and graft: Secondary | ICD-10-CM

## 2015-01-15 DIAGNOSIS — J440 Chronic obstructive pulmonary disease with acute lower respiratory infection: Principal | ICD-10-CM | POA: Diagnosis present

## 2015-01-15 DIAGNOSIS — R0600 Dyspnea, unspecified: Secondary | ICD-10-CM | POA: Insufficient documentation

## 2015-01-15 DIAGNOSIS — T82198A Other mechanical complication of other cardiac electronic device, initial encounter: Secondary | ICD-10-CM | POA: Diagnosis not present

## 2015-01-15 DIAGNOSIS — Z0389 Encounter for observation for other suspected diseases and conditions ruled out: Secondary | ICD-10-CM | POA: Diagnosis not present

## 2015-01-15 DIAGNOSIS — R918 Other nonspecific abnormal finding of lung field: Secondary | ICD-10-CM | POA: Diagnosis present

## 2015-01-15 DIAGNOSIS — I252 Old myocardial infarction: Secondary | ICD-10-CM

## 2015-01-15 DIAGNOSIS — R059 Cough, unspecified: Secondary | ICD-10-CM | POA: Insufficient documentation

## 2015-01-15 DIAGNOSIS — Z4502 Encounter for adjustment and management of automatic implantable cardiac defibrillator: Secondary | ICD-10-CM

## 2015-01-15 DIAGNOSIS — I959 Hypotension, unspecified: Secondary | ICD-10-CM | POA: Diagnosis present

## 2015-01-15 DIAGNOSIS — R05 Cough: Secondary | ICD-10-CM | POA: Diagnosis not present

## 2015-01-15 DIAGNOSIS — I2510 Atherosclerotic heart disease of native coronary artery without angina pectoris: Secondary | ICD-10-CM | POA: Diagnosis not present

## 2015-01-15 DIAGNOSIS — I471 Supraventricular tachycardia: Secondary | ICD-10-CM | POA: Diagnosis not present

## 2015-01-15 DIAGNOSIS — I480 Paroxysmal atrial fibrillation: Secondary | ICD-10-CM | POA: Diagnosis present

## 2015-01-15 DIAGNOSIS — R1312 Dysphagia, oropharyngeal phase: Secondary | ICD-10-CM | POA: Diagnosis present

## 2015-01-15 DIAGNOSIS — I10 Essential (primary) hypertension: Secondary | ICD-10-CM | POA: Diagnosis present

## 2015-01-15 DIAGNOSIS — Z95810 Presence of automatic (implantable) cardiac defibrillator: Secondary | ICD-10-CM | POA: Diagnosis present

## 2015-01-15 DIAGNOSIS — I255 Ischemic cardiomyopathy: Secondary | ICD-10-CM | POA: Diagnosis present

## 2015-01-15 DIAGNOSIS — Z87891 Personal history of nicotine dependence: Secondary | ICD-10-CM

## 2015-01-15 DIAGNOSIS — I472 Ventricular tachycardia: Secondary | ICD-10-CM

## 2015-01-15 DIAGNOSIS — I4892 Unspecified atrial flutter: Secondary | ICD-10-CM | POA: Diagnosis present

## 2015-01-15 DIAGNOSIS — C3411 Malignant neoplasm of upper lobe, right bronchus or lung: Secondary | ICD-10-CM | POA: Diagnosis not present

## 2015-01-15 DIAGNOSIS — Z79899 Other long term (current) drug therapy: Secondary | ICD-10-CM

## 2015-01-15 DIAGNOSIS — J189 Pneumonia, unspecified organism: Secondary | ICD-10-CM | POA: Diagnosis not present

## 2015-01-15 DIAGNOSIS — E43 Unspecified severe protein-calorie malnutrition: Secondary | ICD-10-CM | POA: Diagnosis present

## 2015-01-15 DIAGNOSIS — I5022 Chronic systolic (congestive) heart failure: Secondary | ICD-10-CM

## 2015-01-15 DIAGNOSIS — F419 Anxiety disorder, unspecified: Secondary | ICD-10-CM | POA: Diagnosis not present

## 2015-01-15 DIAGNOSIS — Z7189 Other specified counseling: Secondary | ICD-10-CM | POA: Diagnosis present

## 2015-01-15 DIAGNOSIS — Z515 Encounter for palliative care: Secondary | ICD-10-CM | POA: Diagnosis present

## 2015-01-15 DIAGNOSIS — K219 Gastro-esophageal reflux disease without esophagitis: Secondary | ICD-10-CM | POA: Diagnosis present

## 2015-01-15 DIAGNOSIS — J439 Emphysema, unspecified: Secondary | ICD-10-CM | POA: Diagnosis present

## 2015-01-15 DIAGNOSIS — E871 Hypo-osmolality and hyponatremia: Secondary | ICD-10-CM | POA: Diagnosis present

## 2015-01-15 DIAGNOSIS — J181 Lobar pneumonia, unspecified organism: Secondary | ICD-10-CM

## 2015-01-15 DIAGNOSIS — Z6820 Body mass index (BMI) 20.0-20.9, adult: Secondary | ICD-10-CM

## 2015-01-15 DIAGNOSIS — R410 Disorientation, unspecified: Secondary | ICD-10-CM | POA: Diagnosis not present

## 2015-01-15 LAB — CBC
HEMATOCRIT: 32.9 % — AB (ref 39.0–52.0)
Hemoglobin: 11.2 g/dL — ABNORMAL LOW (ref 13.0–17.0)
MCH: 32.2 pg (ref 26.0–34.0)
MCHC: 34 g/dL (ref 30.0–36.0)
MCV: 94.5 fL (ref 78.0–100.0)
Platelets: 292 10*3/uL (ref 150–400)
RBC: 3.48 MIL/uL — ABNORMAL LOW (ref 4.22–5.81)
RDW: 14 % (ref 11.5–15.5)
WBC: 6 10*3/uL (ref 4.0–10.5)

## 2015-01-15 LAB — BASIC METABOLIC PANEL
Anion gap: 8 (ref 5–15)
BUN: 10 mg/dL (ref 6–20)
CHLORIDE: 97 mmol/L — AB (ref 101–111)
CO2: 28 mmol/L (ref 22–32)
Calcium: 8.6 mg/dL — ABNORMAL LOW (ref 8.9–10.3)
Creatinine, Ser: 0.86 mg/dL (ref 0.61–1.24)
GFR calc Af Amer: >60 mL/min (ref >60)
GFR calc non Af Amer: >60 mL/min (ref >60)
GLUCOSE: 112 mg/dL — AB (ref 65–99)
POTASSIUM: 3.6 mmol/L (ref 3.5–5.1)
Sodium: 133 mmol/L — ABNORMAL LOW (ref 135–145)

## 2015-01-15 LAB — I-STAT TROPONIN, ED: Troponin i, poc: 0.02 ng/mL (ref 0.00–0.08)

## 2015-01-15 LAB — BRAIN NATRIURETIC PEPTIDE: B NATRIURETIC PEPTIDE 5: 371.9 pg/mL — AB (ref 0.0–100.0)

## 2015-01-15 LAB — PROCALCITONIN: Procalcitonin: <0.1 ng/mL

## 2015-01-15 LAB — MAGNESIUM: Magnesium: 1.8 mg/dL (ref 1.7–2.4)

## 2015-01-15 MED ORDER — LORAZEPAM 0.5 MG PO TABS
0.2500 mg | ORAL_TABLET | Freq: Two times a day (BID) | ORAL | Status: DC
  Administered 2015-01-15 – 2015-01-21 (×12): 0.25 mg via ORAL
  Filled 2015-01-15 (×12): qty 1

## 2015-01-15 MED ORDER — LEVOFLOXACIN 750 MG PO TABS: 750.0000 mg | ORAL_TABLET | Freq: Once | ORAL | Status: DC

## 2015-01-15 MED ORDER — DOXYCYCLINE HYCLATE 100 MG PO TABS
100.0000 mg | ORAL_TABLET | Freq: Once | ORAL | Status: DC
  Filled 2015-01-15: qty 1

## 2015-01-15 MED ORDER — ACETAMINOPHEN 325 MG PO TABS
650.0000 mg | ORAL_TABLET | Freq: Four times a day (QID) | ORAL | Status: DC | PRN
  Administered 2015-01-17 – 2015-01-20 (×4): 650 mg via ORAL
  Filled 2015-01-15 (×4): qty 2

## 2015-01-15 MED ORDER — PANTOPRAZOLE SODIUM 40 MG PO TBEC
40.0000 mg | DELAYED_RELEASE_TABLET | Freq: Every day | ORAL | Status: DC
  Administered 2015-01-15: 40 mg via ORAL
  Filled 2015-01-15 (×2): qty 1

## 2015-01-15 MED ORDER — POTASSIUM CHLORIDE CRYS ER 20 MEQ PO TBCR
20.0000 meq | EXTENDED_RELEASE_TABLET | Freq: Every day | ORAL | Status: DC
  Administered 2015-01-15: 20 meq via ORAL
  Filled 2015-01-15 (×2): qty 1

## 2015-01-15 MED ORDER — FAMOTIDINE 20 MG PO TABS: 10.0000 mg | ORAL_TABLET | Freq: Every day | ORAL | Status: DC

## 2015-01-15 MED ORDER — DEXTROSE 5 % IV SOLN
1.0000 g | Freq: Once | INTRAVENOUS | Status: AC
  Administered 2015-01-15: 1 g via INTRAVENOUS
  Filled 2015-01-15: qty 10

## 2015-01-15 MED ORDER — DEXTROSE 5 % IV SOLN
1.0000 g | INTRAVENOUS | Status: DC
  Filled 2015-01-15: qty 10

## 2015-01-15 MED ORDER — ADULT MULTIVITAMIN W/MINERALS CH
1.0000 | ORAL_TABLET | Freq: Every day | ORAL | Status: DC
  Administered 2015-01-15 – 2015-01-21 (×7): 1 via ORAL
  Filled 2015-01-15 (×7): qty 1

## 2015-01-15 MED ORDER — ENOXAPARIN SODIUM 40 MG/0.4ML ~~LOC~~ SOLN
40.0000 mg | SUBCUTANEOUS | Status: DC
  Administered 2015-01-15 – 2015-01-18 (×4): 40 mg via SUBCUTANEOUS
  Filled 2015-01-15 (×4): qty 0.4

## 2015-01-15 MED ORDER — SODIUM CHLORIDE 0.9 % IV SOLN
INTRAVENOUS | Status: DC
  Administered 2015-01-15: 17:00:00 via INTRAVENOUS

## 2015-01-15 MED ORDER — AMIODARONE HCL 200 MG PO TABS
200.0000 mg | ORAL_TABLET | Freq: Two times a day (BID) | ORAL | Status: DC
  Administered 2015-01-15 – 2015-01-16 (×3): 200 mg via ORAL
  Filled 2015-01-15 (×6): qty 1

## 2015-01-15 MED ORDER — SENNOSIDES-DOCUSATE SODIUM 8.6-50 MG PO TABS: 1.0000 | ORAL_TABLET | Freq: Every evening | ORAL | Status: DC | PRN

## 2015-01-15 NOTE — H&P (Signed)
Date: 01/15/2015               Patient Name:  George Acosta MRN: [id number]  DOB: March 02, 1929 Age / Sex: 81 y.o., male   PCP: Janie Morning, DO         Medical Service: Internal Medicine Teaching Service         Attending Physician: Dr. Michel Bickers, MD    First Contact: Dr. Lovena Le Pager: [phone]  Second Contact: Dr. Gordy Levan Pager: [phone]       After Hours (After 5p/  First Contact Pager: [phone]  weekends / holidays): Second Contact Pager: [phone]   Chief Complaint: ICD shock  History of Present Illness: George Acosta is an 81 yo male with CAD, ICM s/p ICD placement, COPD, NSCLC s/p radiation, and h/o left femoral fracture, presenting after being shocked by his ICD 5 times today.  Patient was walking into his house today when he started to feel dizzy and was shocked by his ICD.  He denies CP, SOB, or palpitations prior to being shocked.  ICD interrogation in the ED revealed 5 inappropriate shocks due to misinterpretation of SVT as VT.  He has a h/o afib, rate controlled on Sotalol. However, he was recently seen at [facility], where his Sotalol was halved from 160 BID to 80 BID due to hypotension.    Patient says he has been having a 1 week h/o cough productive yellow/green sputum.  He denies fever, chills, or night sweats. He denies recent antibiotic use, except a couple day course of Ciprofloxacin he received for possibly cystitis.  However, he stopped taking the Cipro due to his "heart feeling funny."   He has been having trouble swallowing, and is unable to swallow his ASA or vitamin.  He does not have a problem with water, soft foods, or his other pills.  However, he will sometimes cough up meat or other solid foods. Patient's daughter reports he underwent EGD within the last couple years, which was normal.  He has a h/o NSCLC and recently finished radiation therapy.  He has had a 20 lb unintentional weight loss over the last year due to decreased appetite.  He says he does not  eat a lot of food.  He mostly drinks coffee.  He denies feeling dizzy when he stands up.  He had a recent fall in March 2016, where he broke his left hip.  He underwent rehab in SNF until April.    He otherwise denies N/V, constipation, diarrhea, abdominal pain, dysuria, or leg swelling.    Meds: Current Facility-Administered Medications  Medication Dose Route Frequency Provider Last Rate Last Dose  . 0.9 %  sodium chloride infusion   Intravenous Continuous Jones Bales, MD 75 mL/hr at 01/15/15 1701    . acetaminophen (TYLENOL) tablet 650 mg  650 mg Oral Q6H PRN Jones Bales, MD      . amiodarone (PACERONE) tablet 200 mg  200 mg Oral BID Evans Lance, MD   200 mg at 01/15/15 1645  . [START ON 01/16/2015] cefTRIAXone (ROCEPHIN) 1 g in dextrose 5 % 50 mL IVPB  1 g Intravenous Q24H Crystal S Robertson, RPH      . enoxaparin (LOVENOX) injection 40 mg  40 mg Subcutaneous Q24H Jones Bales, MD      . famotidine (PEPCID) tablet 10 mg  10 mg Oral QHS Jones Bales, MD      . LORazepam (ATIVAN) tablet 0.25 mg  0.25 mg Oral  BID Jones Bales, MD      . multivitamin with minerals tablet 1 tablet  1 tablet Oral Daily Jones Bales, MD      . pantoprazole (PROTONIX) EC tablet 40 mg  40 mg Oral Daily Jones Bales, MD      . potassium chloride SA (K-DUR,KLOR-CON) CR tablet 20 mEq  20 mEq Oral Daily Jones Bales, MD      . senna-docusate (Senokot-S) tablet 1 tablet  1 tablet Oral QHS PRN Jones Bales, MD        Allergies: Allergies as of 01/15/2015 - Review Complete 01/15/2015  Allergen Reaction Noted  . Sulfonamide derivatives Hives    Past Medical History  Diagnosis Date  . Coronary atherosclerosis of unspecified type of vessel, native or graft   . Unspecified essential hypertension   . Benign prostatic hypertrophy   . Peptic ulcer disease   . Diverticulosis   . GERD (gastroesophageal reflux disease)   . Ischemic cardiomyopathy   . Congestive heart failure,  unspecified   . Arrhythmia     paroxysmal afib  . Ventricular tachycardia ([place])     A.   monomorphic VT  B. 04/2011 s/p generator change - [employer] ICD  [phone]  . MI (myocardial infarction) ([place]) 1985    "big MI after I got to hospital"  . Acute myocardial infarction, unspecified site, episode of care unspecified 1985    "on way to hospital"  . Pneumonia 1980's  . Anxiety   . Pancreatitis 1980's  . Paroxysmal atrial fibrillation (HCC)   . AICD (automatic cardioverter/defibrillator) present     [employer]  . Presence of permanent cardiac pacemaker   . COPD (chronic obstructive pulmonary disease) ([place])   . SOB (shortness of breath)     not recently  . Arthritis   . Lung mass   . Radiation 2/2,2/4,2/8,2/10,05/24/14    Right upper lobe 60 Gy in 5 fractions   Past Surgical History  Procedure Laterality Date  . Icd  2001; 2007; 04/28/11    generator change; complete replacement; lead replacement  . Laparotomy  11/2005    for small bowel obstruction,  . Cardiac catheterization  11/23/2005  . Cardiac defibrillator placement  1996  . Cholecystectomy  1980's  . Cataract extraction  2012    left eye  . Video bronchoscopy with endobronchial navigation Right 02/27/2014    Procedure: VIDEO BRONCHOSCOPY WITH ENDOBRONCHIAL NAVIGATION,insertion Feducial marker right upper lobe lung;  Surgeon: Collene Gobble, MD;  Location: [place];  Service: Thoracic;  Laterality: Right;  . Implantable cardioverter defibrillator (icd) generator change N/A 04/28/2011    Procedure: ICD GENERATOR CHANGE;  Surgeon: Evans Lance, MD;  Location: [facility] CATH LAB;  Service: Cardiovascular;  Laterality: N/A;  . Lead revision N/A 04/28/2011    Procedure: LEAD REVISION;  Surgeon: Evans Lance, MD;  Location: [facility] CATH LAB;  Service: Cardiovascular;  Laterality: N/A;   Family History  Problem Relation Age of Onset  . Heart disease Mother   . Heart disease Daughter   . Cancer Daughter    Social  History   Social History  . Marital Status: Widowed    Spouse Name: N/A  . Number of Children: N/A  . Years of Education: N/A   Occupational History  . retired Administrator    Social History Main Topics  . Smoking status: Former Smoker -- 2.00 packs/day for 40 years    Types: Cigarettes  Quit date: 04/18/1983  . Smokeless tobacco: Former Systems developer    Types: Snuff, Chew     Comment: quit smoking cigarettes 1985; quit smokeless tobacco ~ 2011  . Alcohol Use: No  . Drug Use: No  . Sexual Activity: No   Other Topics Concern  . Not on file   Social History Narrative   The patient lives in [place]. He is widowed. 2 boys 2girls   He is a  retired Administrator   Patient is former smoker no tobacco use   Caffeine use - 3 cups    Review of Systems: Pertinent items are noted in HPI.  Physical Exam: Blood pressure 109/69, pulse 87, temperature 97.9 F (36.6 C), temperature source Oral, resp. rate 16, height '5\' 7"'$  (1.702 m), weight 130 lb (58.968 kg), SpO2 90 %. Physical Exam  Constitutional: He is oriented to person, place, and time.  Frail, cachectic, chronically ill appearing male lying in bed in NAD  HENT:  Head: Normocephalic and atraumatic.  No fat pads  Eyes: EOM are normal. No scleral icterus.  Neck: Normal range of motion. No JVD present. No tracheal deviation present.  Cardiovascular: Intact distal pulses.   Irregularly irregular rhythm. Normal rate. No adventitious heart sounds appreciated.  Pulmonary/Chest: Effort normal. No respiratory distress. He has no wheezes.  Decreased breath sounds in LLL.  Abdominal: Soft. He exhibits no distension. There is no rebound and no guarding.  Minimally tender to deep palpation diffusely.  Musculoskeletal: Normal range of motion. He exhibits no edema.  Neurological: He is alert and oriented to person, place, and time.  Skin: Skin is warm and dry. No rash noted.     Lab results: Basic Metabolic Panel:  Recent Labs   01/13/15 0339 01/15/15 0959  NA 132* 133*  K 4.1 3.6  CL 97* 97*  CO2 27 28  GLUCOSE 115* 112*  BUN 16 10  CREATININE 0.84 0.86  CALCIUM 8.9 8.6*   Liver Function Tests: No results for input(s): AST, ALT, ALKPHOS, BILITOT, PROT, ALBUMIN in the last 72 hours. No results for input(s): LIPASE, AMYLASE in the last 72 hours. No results for input(s): AMMONIA in the last 72 hours. CBC:  Recent Labs  01/13/15 0339 01/15/15 0959  WBC 5.9 6.0  HGB 11.1* 11.2*  HCT 33.7* 32.9*  MCV 96.0 94.5  PLT 224 292   Cardiac Enzymes: No results for input(s): CKTOTAL, CKMB, CKMBINDEX, TROPONINI in the last 72 hours. BNP: No results for input(s): PROBNP in the last 72 hours. D-Dimer: No results for input(s): DDIMER in the last 72 hours. CBG: No results for input(s): GLUCAP in the last 72 hours. Hemoglobin A1C: No results for input(s): HGBA1C in the last 72 hours. Fasting Lipid Panel: No results for input(s): CHOL, HDL, LDLCALC, TRIG, CHOLHDL, LDLDIRECT in the last 72 hours. Thyroid Function Tests: No results for input(s): TSH, T4TOTAL, FREET4, T3FREE, THYROIDAB in the last 72 hours. Anemia Panel: No results for input(s): VITAMINB12, FOLATE, FERRITIN, TIBC, IRON, RETICCTPCT in the last 72 hours. Coagulation:  Recent Labs  01/13/15 0339  LABPROT 15.5*  INR 1.22   Urine Drug Screen: Drugs of Abuse  No results found for: LABOPIA, COCAINSCRNUR, LABBENZ, AMPHETMU, THCU, LABBARB  Alcohol Level: No results for input(s): ETH in the last 72 hours. Urinalysis: No results for input(s): COLORURINE, LABSPEC, PHURINE, GLUCOSEU, HGBUR, BILIRUBINUR, KETONESUR, PROTEINUR, UROBILINOGEN, NITRITE, LEUKOCYTESUR in the last 72 hours.  Invalid input(s): APPERANCEUR Misc. Labs:   Imaging results:  Dg Chest 2 View  01/15/2015   CLINICAL DATA:  81 year old male with dizziness, AICD discharge today. Productive cough. Initial encounter.  EXAM: CHEST  2 VIEW  COMPARISON:  01/13/2015 and earlier.   FINDINGS: Seated upright AP and lateral views of the chest. Stable left chest cardiac AICD. Stable cardiac size and mediastinal contours. No pneumothorax or pulmonary edema. Worsening confluence of opacity at the left lower lobe. Possible small pleural effusion. Ventilation elsewhere is stable. Nodular changes in the right apex again noted. Osteopenia. Lower thoracic compression fracture appears stable. Stable cholecystectomy clips.  IMPRESSION: 1. Increasing opacity at the left lung base suspicious for pneumonia but might reflect worsening atelectasis. Possible small left pleural effusion. 2. Otherwise stable chest   Electronically Signed   By: Genevie Ann M.D.   On: 01/15/2015 10:45    Other results: EKG: atrial fibrillation, rate 76, prolonged QT interval (505).  Assessment & Plan by Problem: Principal Problem:   Left lower lobe pneumonia Active Problems:   GERD   Other dysphagia   Automatic implantable cardioverter-defibrillator in situ   Ischemic cardiomyopathy   Paroxysmal atrial fibrillation (HCC)   COPD with emphysema [facility])  Mr. Lawhead is an 81 yo male with CAD, ICM s/p ICD placement, COPD, NSCLC s/p radiation, and h/o left femoral fracture, with LLL CAP.  LLL CAP: Patient with 1 week increase in yellow/green sputum.  Interval increase in LLL opacity over last two days. Patient s/p CTX and doxycycline in ED.  CURB65 is 1. Lack of SIRS criteria likely 2/2 patient being elderly and immunosuppressed by lung cancer and irradiation.  Will continue IV antibiotics overnight with switch to oral agent tomorrow. - CTX per pharmacy '[ ]'$  Blood culture '[ ]'$  Sputum culture '[ ]'$  Legionella urine Ag  Afib: Previously rate controlled on Sotalol 160 mg BID, but hypotensive.  Cardiology evaluation in ED changed regimen to Amiodarone. Patient had recent fall in March; therefore, despite CHADsVASc of 5, patient likely too unsafe to anticoagulate. - Amiodarone 200 mg BID per cardiology  ICM s/p ICD placement:  Patient received 5 inappropriate shocks for misinterpretation of SVT.  Cardiology reprogrammed ICD to only shock for ventricular rates > 200 bpm.  Dysphagia: Dysphagia to some pills and hard solid foods.   - Speech eval  Malnutrition: Chronically ill appearing male with decreased appetite, weight loss, low body fat. - Nutrition consult - PT/OT  FEN/GI: - Soft diet - Protonix  DVT Ppx: Lovenox  Dispo: Disposition is deferred at this time, awaiting improvement of current medical problems. Anticipated discharge in approximately 1-2 day(s).   The patient does have a current PCP Janie Morning, DO) and does need an [facility] hospital follow-up appointment after discharge.  The patient does not have transportation limitations that hinder transportation to clinic appointments.  Signed: Iline Oven, MD, PhD 01/15/2015, 6:58 PM

## 2015-01-15 NOTE — H&P (Signed)
   Date: 01/15/2015  Patient name: George Acosta  Medical record number: [id number]  Date of birth: June 22, 1928   I have seen and evaluated George Acosta and discussed their care with the Residency Team.   Assessment and Plan: I have seen and evaluated the patient as outlined above. I agree with the formulated Assessment and Plan as detailed in the residents' admission note.  [name] is an 81 year old with coronary artery disease, chronic systolic heart failure and arrhythmias. He has been implantable defibrillator. He recently completed radiation therapy for right upper lobe lung cancer. He has had a poor appetite and some difficulty swallowing pills and solid food. He's had a 30 pound unintentional weight loss recently. He developed some hypotension several months ago and his sotalol dose was cut in half and he was taken off of some of his antihypertensive agents. He recently sustained some defibrillator discharges. He was seen at [facility] emergency department about one week ago and it appears that he had had an appropriate shock for an episode of rapid atrial fibrillation. Over the past day he has had some dizziness with repeated shocks. He came to the emergency department today he was found to have SVT and he has the defibrillator was reprogrammed and his sotalol was changed to amiodarone. He mentioned that he had developed an increased productive cough over the past week. He has had some shivering but no documented fever. He denies any shortness of breath. His cough is productive of yellow-green sputum with a sour taste. His chest x-ray shows a new left lower lobe infiltrate. I agree with empiric therapy for probable community-acquired left lower lobe pneumonia. We will keep him in the hospital for observation overnight and treat with ceftriaxone pending sputum and blood cultures.  Michel Bickers, MD 10/5/20166:20 PM

## 2015-01-15 NOTE — ED Notes (Signed)
Pt states this morning his defib fired 5x in the last 3 minutes. Pt also stated this happened two days ago, had his defib interrogated, and it did not fire per EMS. EMS arrived, pt in Afib HR 70's with occassional pacemaker spike. BP 112/64, HR 70

## 2015-01-15 NOTE — Consult Note (Addendum)
ELECTROPHYSIOLOGY CONSULT NOTE    Patient ID: George Acosta MRN: [id number], DOB/AGE: 1928-10-01 81 y.o.  Admit date: 01/15/2015 Date of Consult: 01/15/2015  Primary Physician: Janie Morning, DO Electrophysiologist: Lovena Le  Reason for Consultation: ICD shocks  HPI:  George Acosta is a 81 y.o. male with a past medical history significant for CAD, ICM s/p ICD, ventricular tachycardia with previous appropriate ICD therapies, lung cancer s/p radiation treatment, and newly identified atrial arrhythmias.  He presented today to the ER after developing acute onset dizziness while walking into the kitchen and then having 4 ICD shocks.  He did not have chest pain or shortness of breath.  ICD interrogation demonstrates inappropriate ICD therapy for a 1:1 SVT with atrial undersensing and classification of the rhythm as VT.  Device reprogrammed for now to VF only zone at 220bpm.  He has developed recent productive cough but has not had fevers or chills.  He is fairly sedentary at home but has not noticed recent decline in functional status.  He has completed his radiation therapy for lung cancer. He has not had frank syncope.   His daughter states that he was seen in [facility] several months ago at which point his Sotalol was decreased to '80mg'$  twice daily from '160mg'$  twice daily 2/2 hypotension.  He was seen at [facility] at the end of last month with inappropriate ICD shocks at that time as well.   Past Medical History  Diagnosis Date  . Coronary atherosclerosis of unspecified type of vessel, native or graft   . Unspecified essential hypertension   . Benign prostatic hypertrophy   . Peptic ulcer disease   . Diverticulosis   . GERD (gastroesophageal reflux disease)   . Ischemic cardiomyopathy   . Congestive heart failure, unspecified   . Arrhythmia     paroxysmal afib  . Ventricular tachycardia ([place])     A.   monomorphic VT  B. 04/2011 s/p generator change - [employer] ICD  [phone]  . MI (myocardial infarction) ([place]) 1985    "big MI after I got to hospital"  . Acute myocardial infarction, unspecified site, episode of care unspecified 1985    "on way to hospital"  . Pneumonia 1980's  . Anxiety   . Pancreatitis 1980's  . Paroxysmal atrial fibrillation (H[facility])   . AICD (automatic cardioverter/defibrillator) present     [employer]  . Presence of permanent cardiac pacemaker   . COPD (chronic obstructive pulmonary disease) ([place])   . SOB (shortness of breath)     not recently  . Arthritis   . Lung mass   . Radiation 2/2,2/4,2/8,2/10,05/24/14    Right upper lobe 60 Gy in 5 fractions     Surgical History:  Past Surgical History  Procedure Laterality Date  . Icd  2001; 2007; 04/28/11    generator change; complete replacement; lead replacement  . Laparotomy  11/2005    for small bowel obstruction,  . Cardiac catheterization  11/23/2005  . Cardiac defibrillator placement  1996  . Cholecystectomy  1980's  . Cataract extraction  2012    left eye  . Video bronchoscopy with endobronchial navigation Right 02/27/2014    Procedure: VIDEO BRONCHOSCOPY WITH ENDOBRONCHIAL NAVIGATION,insertion Feducial marker right upper lobe lung;  Surgeon: Collene Gobble, MD;  Location: [place];  Service: Thoracic;  Laterality: Right;  . Implantable cardioverter defibrillator (icd) generator change N/A 04/28/2011    Procedure: ICD GENERATOR CHANGE;  Surgeon: Evans Lance, MD;  Location: [place] CATH LAB;  Service: Cardiovascular;  Laterality: N/A;  . Lead revision N/A 04/28/2011    Procedure: LEAD REVISION;  Surgeon: Evans Lance, MD;  Location: [facility] CATH LAB;  Service: Cardiovascular;  Laterality: N/A;    Current outpatient prescriptions:  .  acetaminophen (TYLENOL) 325 MG tablet, Take 650 mg by mouth every 6 (six) hours as needed for mild pain., Disp: , Rfl:  .  guaiFENesin-codeine (ROBITUSSIN AC) 100-10 MG/5ML syrup, Take 5 mLs by mouth 3 (three) times daily as needed for  cough., Disp: , Rfl:  .  LORazepam (ATIVAN) 0.5 MG tablet, Take 0.25 mg by mouth 2 (two) times daily., Disp: , Rfl:  .  Multiple Vitamin (MULTIVITAMIN WITH MINERALS) TABS, Take 1 tablet by mouth daily., Disp: , Rfl:  .  nitroGLYCERIN (NITROSTAT) 0.4 MG SL tablet, Place 1 tablet (0.4 mg total) under the tongue every 5 (five) minutes as needed. For chest pain (Patient taking differently: Place 0.4 mg under the tongue every 5 (five) minutes as needed for chest pain. ), Disp: 30 tablet, Rfl: 11 .  pantoprazole (PROTONIX) 40 MG tablet, Take 40 mg by mouth daily. , Disp: , Rfl:  .  potassium chloride SA (K-DUR,KLOR-CON) 20 MEQ tablet, Take 20 mEq by mouth daily.  , Disp: , Rfl:  .  ranitidine (ZANTAC) 150 MG tablet, Take 150 mg by mouth at bedtime. , Disp: , Rfl:  .  sotalol (BETAPACE) 80 MG tablet, TAKE 2 TABLETS TWICE DAILY. (Patient taking differently: TAKE  1  TABLETS TWICE DAILY.), Disp: 120 tablet, Rfl: 11   Allergies:  Allergies  Allergen Reactions  . Sulfonamide Derivatives Hives    Social History   Social History  . Marital Status: Widowed    Spouse Name: N/A  . Number of Children: N/A  . Years of Education: N/A   Occupational History  . retired Administrator    Social History Main Topics  . Smoking status: Former Smoker -- 2.00 packs/day for 40 years    Types: Cigarettes    Quit date: 04/18/1983  . Smokeless tobacco: Former Systems developer    Types: Snuff, Chew     Comment: quit smoking cigarettes 1985; quit smokeless tobacco ~ 2011  . Alcohol Use: No  . Drug Use: No  . Sexual Activity: No   Other Topics Concern  . Not on file   Social History Narrative   The patient lives in [place]. He is widowed. 2 boys 2girls   He is a  retired Administrator   Patient is former smoker no tobacco use   Caffeine use - 3 cups     Family History  Problem Relation Age of Onset  . Heart disease Mother   . Heart disease Daughter   . Cancer Daughter      Review of Systems: All other  systems reviewed and are otherwise negative except as noted above.  Physical Exam: Filed Vitals:   01/15/15 0923 01/15/15 0928 01/15/15 1118  BP:  95/47 85/49  Pulse:   79  Temp:  97.6 F (36.4 C)   TempSrc:  Oral   Resp:  30 19  Height: '5\' 7"'$  (1.702 m)    Weight: 130 lb (58.968 kg)    SpO2:  96% 97%    GEN- The patient is elderly, frail, and cachectic appearing, alert and oriented x 3 today.   HEENT: normocephalic, atraumatic; sclera clear, conjunctiva pink; hearing intact; oropharynx clear; neck supple  Lungs- Decreased breath sounds left lung base,  scattered rhonchi L>R Heart- Irregular rate and rhythm  GI- soft, non-tender, non-distended, bowel sounds present  Extremities- no clubbing, cyanosis, or edema  MS- no significant deformity or atrophy Skin- warm and dry, no rash or lesion Psych- euthymic mood, full affect Neuro- strength and sensation are intact  Labs:   Lab Results  Component Value Date   WBC 6.0 01/15/2015   HGB 11.2* 01/15/2015   HCT 32.9* 01/15/2015   MCV 94.5 01/15/2015   PLT 292 01/15/2015    Recent Labs Lab 01/15/15 0959  NA 133*  K 3.6  CL 97*  CO2 28  BUN 10  CREATININE 0.86  CALCIUM 8.6*  GLUCOSE 112*      Radiology/Studies: Dg Chest 2 View 01/15/2015   CLINICAL DATA:  81 year old male with dizziness, AICD discharge today. Productive cough. Initial encounter.  EXAM: CHEST  2 VIEW  COMPARISON:  01/13/2015 and earlier.  FINDINGS: Seated upright AP and lateral views of the chest. Stable left chest cardiac AICD. Stable cardiac size and mediastinal contours. No pneumothorax or pulmonary edema. Worsening confluence of opacity at the left lower lobe. Possible small pleural effusion. Ventilation elsewhere is stable. Nodular changes in the right apex again noted. Osteopenia. Lower thoracic compression fracture appears stable. Stable cholecystectomy clips.  IMPRESSION: 1. Increasing opacity at the left lung base suspicious for pneumonia but might  reflect worsening atelectasis. Possible small left pleural effusion. 2. Otherwise stable chest   Electronically Signed   By: Genevie Ann M.D.   On: 01/15/2015 10:45   YBW:LSLHTD fibrillation with atrial undersensing, rate 76  TELEMETRY: sinus rhythm with frequent PAC's  Assessment/Plan: 1.  Atrial tachycardia/SVT The patient received inappropriate ICD therapy for atrial tachycardia/SVT in the context of decreased Sotalol dosing At this point I think switching to amiodarone is most appropriate.  Device reprogrammed to a VF zone of 220bpm With advanced age and frail status, would consider deactivating ICD therapies, will discuss with Dr Lovena Le  2.  Paroxysmal atrial fibrillation Newly identified on device interrogation CHADS2VASC is at least 5 He may be too high risk for [facility], will review with Dr Lovena Le  3.  Ventricular tachycardia No appropriate ICD therapy in at least 3 years Continue Sotalol  4.  Productive cough/CXR with possible pneumonia with lung cancer Will ask IM team to admit and manage  5.  Hyponatremia   From an EP standpoint, if he tolerates increased Sotalol dose, would be ok to discharge in ~ 24 hours with outpatient follow up. Dr Lovena Le to see later today   Signed, Chanetta Marshall, NP 01/15/2015 11:38 AM   EP Attending  Patient seen and examined.  Agree with the findings as noted above by Chanetta Marshall, NP. His exam is as documented above. He appears chronically ill. He has had recurrent ICD shock due to atrial tachyardia and undersensing of his atrial signals resulting in the device miscategorizing his rhythm as VT when it was atrial tachy. In light of hypotension of sotalol, overall declining state I would recommend we switch to amiodarone and stop sotalol. His CXR suggests pneumonia. I am uncertain but he has multiple risk factors in the setting of lung CA. I'll defer treatment of this to hospital team. He clearly has a productive cough.  If he is stable tomorrow and no  evidence of active pneumonia, would suggest early discharge. I will write for amiodarone.  Mikle Bosworth.D.

## 2015-01-15 NOTE — ED Notes (Signed)
Patient resting on stretcher , no complaints at present. Family at bedside.

## 2015-01-15 NOTE — ED Provider Notes (Signed)
CSN: [id number]     Arrival date & time 01/15/15  0920 History   First MD Initiated Contact with Patient 01/15/15 [phone]     Chief Complaint  Patient presents with  . AICD Problem     (Consider location/radiation/quality/duration/timing/severity/associated sxs/prior Treatment) HPI Comments: 81 year old male with extensive past medical history including AICD, CHF, ischemic cardiomyopathy, CAD, COPD who presents with defibrillator firing. Just prior to arrival, the patient was at home and walked into his house from his car when he began feeling "swimmy headed" as he stepped into his house. Just after he got into his home, he felt his defibrillator fire which she describes as a "jolt" associated with a small amount of central chest pain. He feels that the defibrillator fired 5 times in a three-minute period. Currently, he denies any chest pain, shortness of breath, abdominal pain. He denies any heart palpitations leading up to the event. He has not had any fevers, vomiting, diarrhea. He does endorse several days of mild productive cough.  The history is provided by the patient.    Past Medical History  Diagnosis Date  . Coronary atherosclerosis of unspecified type of vessel, native or graft   . Unspecified essential hypertension   . Benign prostatic hypertrophy   . Peptic ulcer disease   . Diverticulosis   . GERD (gastroesophageal reflux disease)   . Ischemic cardiomyopathy   . Congestive heart failure, unspecified   . Arrhythmia     paroxysmal afib  . Ventricular tachycardia ([place])     A.   monomorphic VT  B. 04/2011 s/p generator change - [employer] ICD  [phone]  . MI (myocardial infarction) ([place]) 1985    "big MI after I got to hospital"  . Acute myocardial infarction, unspecified site, episode of care unspecified 1985    "on way to hospital"  . Pneumonia 1980's  . Anxiety   . Pancreatitis 1980's  . Paroxysmal atrial fibrillation (H[facility])   . AICD (automatic  cardioverter/defibrillator) present     [employer]  . Presence of permanent cardiac pacemaker   . COPD (chronic obstructive pulmonary disease) ([place])   . SOB (shortness of breath)     not recently  . Arthritis   . Lung mass   . Radiation 2/2,2/4,2/8,2/10,05/24/14    Right upper lobe 60 Gy in 5 fractions   Past Surgical History  Procedure Laterality Date  . Icd  2001; 2007; 04/28/11    generator change; complete replacement; lead replacement  . Laparotomy  11/2005    for small bowel obstruction,  . Cardiac catheterization  11/23/2005  . Cardiac defibrillator placement  1996  . Cholecystectomy  1980's  . Cataract extraction  2012    left eye  . Video bronchoscopy with endobronchial navigation Right 02/27/2014    Procedure: VIDEO BRONCHOSCOPY WITH ENDOBRONCHIAL NAVIGATION,insertion Feducial marker right upper lobe lung;  Surgeon: Collene Gobble, MD;  Location: [place];  Service: Thoracic;  Laterality: Right;  . Implantable cardioverter defibrillator (icd) generator change N/A 04/28/2011    Procedure: ICD GENERATOR CHANGE;  Surgeon: Evans Lance, MD;  Location: [facility] CATH LAB;  Service: Cardiovascular;  Laterality: N/A;  . Lead revision N/A 04/28/2011    Procedure: LEAD REVISION;  Surgeon: Evans Lance, MD;  Location: [facility] CATH LAB;  Service: Cardiovascular;  Laterality: N/A;   Family History  Problem Relation Age of Onset  . Heart disease Mother   . Heart disease Daughter   . Cancer Daughter    Social  History  Substance Use Topics  . Smoking status: Former Smoker -- 2.00 packs/day for 40 years    Types: Cigarettes    Quit date: 04/18/1983  . Smokeless tobacco: Former Systems developer    Types: Snuff, Chew     Comment: quit smoking cigarettes 1985; quit smokeless tobacco ~ 2011  . Alcohol Use: No    Review of Systems 10 Systems reviewed and are negative for acute change except as noted in the HPI.    Allergies  Sulfonamide derivatives  Home Medications   Prior to Admission  medications   Medication Sig Start Date End Date Taking? Authorizing Provider  acetaminophen (TYLENOL) 325 MG tablet Take 650 mg by mouth every 6 (six) hours as needed for mild pain.    Historical Provider, MD  guaiFENesin-codeine (ROBITUSSIN AC) 100-10 MG/5ML syrup Take 5 mLs by mouth 3 (three) times daily as needed for cough.    Historical Provider, MD  LORazepam (ATIVAN) 0.5 MG tablet Take 0.25 mg by mouth 2 (two) times daily.    Historical Provider, MD  Multiple Vitamin (MULTIVITAMIN WITH MINERALS) TABS Take 1 tablet by mouth daily.    Historical Provider, MD  nitroGLYCERIN (NITROSTAT) 0.4 MG SL tablet Place 1 tablet (0.4 mg total) under the tongue every 5 (five) minutes as needed. For chest pain Patient taking differently: Place 0.4 mg under the tongue every 5 (five) minutes as needed for chest pain.  03/16/13   Evans Lance, MD  pantoprazole (PROTONIX) 40 MG tablet Take 40 mg by mouth daily.     Historical Provider, MD  potassium chloride SA (K-DUR,KLOR-CON) 20 MEQ tablet Take 20 mEq by mouth daily.      Historical Provider, MD  ranitidine (ZANTAC) 150 MG tablet Take 300 mg by mouth at bedtime.     Historical Provider, MD  sotalol (BETAPACE) 80 MG tablet TAKE 2 TABLETS TWICE DAILY. Patient taking differently: TAKE  1  TABLETS TWICE DAILY. 03/26/14   Evans Lance, MD   BP 95/47 mmHg  Temp(Src) 97.6 F (36.4 C) (Oral)  Resp 30  Ht '5\' 7"'$  (1.702 m)  Wt 130 lb (58.968 kg)  BMI 20.36 kg/m2  SpO2 96% Physical Exam  Constitutional: He is oriented to person, place, and time. He appears well-developed and well-nourished. No distress.  HENT:  Head: Normocephalic and atraumatic.  Moist mucous membranes  Eyes: Conjunctivae are normal. Pupils are equal, round, and reactive to light.  Neck: Neck supple.  Cardiovascular: Normal rate, regular rhythm and normal heart sounds.   No murmur heard. AICD in L upper chest, no erythema or tenderness  Pulmonary/Chest: Effort normal and breath sounds  normal. He has no wheezes.  Occasional productive cough  Abdominal: Soft. Bowel sounds are normal. He exhibits no distension. There is no tenderness.  Musculoskeletal: He exhibits no edema.  Neurological: He is alert and oriented to person, place, and time.  Fluent speech  Skin: Skin is warm and dry.  Psychiatric: He has a normal mood and affect. Judgment normal.  Nursing note and vitals reviewed.   ED Course  Procedures (including critical care time) Labs Review Labs Reviewed  BASIC METABOLIC PANEL  [place], ED    Imaging Review No results found. I have personally reviewed and evaluated these lab results as part of my medical decision-making.   EKG Interpretation   Date/Time:  Wednesday January 15 2015 09:32:55 EDT Ventricular Rate:  76 PR Interval:  75 QRS Duration: 112 QT Interval:  449 QTC Calculation:  505 R Axis:   102 Text Interpretation:  Atrial-paced complexes Inferior infarct, age  indeterminate Probable anterior infarct, age indeterminate No significant  change since last tracing Confirmed by LITTLE MD, [employer] [phone]) on  01/15/2015 9:36:45 AM      MDM   Final diagnoses:  Defibrillator discharge  Community acquired pneumonia   81yo M who presents via EMS from home after he felt like his AICD fired 5 times. On arrival, patient was awake, alert, comfortable. VS stable. EKG on arrival showed sinus rhythm w/ no significant changes compared to previous. Patient denies any pain currently. Placed patient on cardiac monitoring and obtained above lab work, chest x-ray, and pacemaker interrogation.  I reviewed pacemaker interrogation which confirms 5 defibrillations today. Labs including BNP and troponin unremarkable. CXR shows L lower lobe infiltrate and given 1 week of productive cough, gave patient CTX and doxycycline for CAP coverage.   Pt evaluated by EP team and they reviewed his interrogation. They have made medication adjustment recommendations  outlined in their note and have adjusted the ICD threshold settings. Given patient's multiple defibrillations and CAP in setting of multiple medical co-morbidities, he will be admitted to general medicine for further treatment and cardiac monitoring.   Sharlett Iles, MD 01/15/15 2018

## 2015-01-15 NOTE — ED Notes (Signed)
Cariology at bedside

## 2015-01-15 NOTE — ED Notes (Signed)
Daughter states patients blood pressure is normally low and his current pressure is normal for him.

## 2015-01-15 NOTE — H&P (Signed)
Date: 01/15/2015               Patient Name:  George Acosta MRN: [id number]  DOB: 18-May-1928 Age / Sex: 81 y.o., male   PCP: Janie Morning, DO         Medical Service: Internal Medicine Teaching Service         Attending Physician: Dr. Michel Bickers, MD    First Contact: Colin Ina Pager: [phone]  Second Contact: Dr. Lindon Romp         After Hours (After 5p/  First Contact Pager: [phone]  weekends / holidays): Second Contact Pager: [phone]   Chief Complaint: Patient reports that his ICD has fired 5 times this morning. The patient also complains of productive cough and generalized malaise of ~ 1 week duration.   History of Present Illness:George Acosta is an 81 y.o M with PMH of CAD, CHF, Afib with ICD, right upper lobe lung cancer s/p radiation treatment who presented to the ED with self-reported history of 4 ICD shocks. The patient denied chest pain or shortness of breath. The patient also reported to the ED on Monday with reported shock, but investigation did not demonstrate that.Cardiology saw the patient in the ED and reprogrammed his device to VF only zone at 220 bpm, and thought the increased shock was 2/2 decrease in Sotalol dose. Cardiology began the patient on amiodarone.  At that time, the patient had a chest x ray obtained.   Additionally, the patient has a 1 week history of productive cough and general malaise. The patient reports his sputum is white, yellow amd green. Patient also reported recent vomiting and diarrhea. The patient denies any fevers but does report chills. A chest x-ray was obtained in the ED and  left lower lobe consolidation with significant changes since previous chest xray on 01/12/2014. There is evidence on physical exam of left lower lobe consolidation. This is likely a community acquired left lower lobe pneumonia. The patient is afebrile without leukocytosis, but this is not uncommon in elderly patients. The patient is immunocompromised and  mal-nourished and will be admitted to the floor for antibiotic treatment.   Meds:  Acetaminophen '325mg'$  prn  Robitussin (Guanfisen-Codeine) prn cough  Ativan 0.5 mg   Nitroglycerin 0.4 mg prn  Sotalol 80 mg  Ranitidine 150 mg  Pantoprazole 40 mg   Multivitamin  Allergies: Allergies as of 01/15/2015 - Review Complete 01/15/2015  Allergen Reaction Noted  . Sulfonamide derivatives Hives    Past Medical History: Congestive Heart Failure  Currently being followed by Dr. Cristopher Peru   Most recent Echo 2007  Ischemic cardiomyopathy  H/o ICD placement 1996- last generator replacement 04/28/11  H/o Afib- patient is not currently anticoagulated.  Vtach  Coronary Artery Disease: H/o stent placement 2007   Cardiac cath 11/23/05   H/o MI 1985, 1986 History of hypertension  Patient taken off Lasix, Lisinopril due to hypotension.  H/o Pneumonia  Hospitalized July 2015 Right Upper Lobe Mass  Incidentally found 1.1 x 1,2 cm mass on CT scan during hospitalization for Pneumonia in July 2015, referred to Dr. Pablo Ledger for definitive treatment  S/p radiation spring 2016 COPD  No history of home oxygen  Benign Prostatic Hypertrophy  Patient has difficulty with stream of urination, tamsulosin discontinued due to hypotension.  GERD  Well controlled with Pantoprazole and Ranitide.  Left Hip Fracture  S/p fall in spring of 2016, surgically repaired  Patient's PCP is Dr. Janie Morning in [place] [facility])  Family History  Problem Relation Age of Onset  . Heart disease Mother   . Heart disease Daughter   . Cancer Daughter    Social History   Social History  . Marital Status: Widowed    Spouse Name: N/A  . Number of Children: N/A  . Years of Education: N/A   Occupational History  . retired Administrator    Social History Main Topics  . Smoking status: Former Smoker -- 2.00 packs/day for 40 years    Types: Cigarettes    Quit date: 04/18/1983  . Smokeless  tobacco: Former Systems developer    Types: Snuff, Chew     Comment: quit smoking cigarettes 1985; quit smokeless tobacco ~ 2011  . Alcohol Use: No  . Drug Use: No  . Sexual Activity: No   Other Topics Concern  . Not on file   Social History Narrative   The patient lives in [place]. He is widowed. 2 boys 2girls   He is a  retired Administrator   Patient is former smoker no tobacco use   Caffeine use - 3 cups    Review of Systems: Patient reports dry eyes, difficulty swallowing, increased cough, sensitivity to cold, joint aching, easy bruising, memory and concentration difficulties.   Physical Exam: Blood pressure 99/53, pulse 74, temperature 97.6 F (36.4 C), temperature source Oral, resp. rate 16, height '5\' 7"'$  (1.702 m), weight 58.968 kg (130 lb), SpO2 95 %.  Gen: elderly, cachectic male, sitting up in bed. Pleasant.  Neuro: strength is fully intact HEENT: normocephalic, atraumatic, dry mucous membranes, bilateral TMJ clicking, sclera anicteric, oropharynx clear Lungs: Decreased breath sounds left lung base CV: irregular rate, irregular rhythm Ab: S, NT, ND Skin: non-elastic skin, ecchymosis throughout arms and legs, extremely dry cracked skin on legs and feet Extremities: left knee and leg tender to palpation, dry, erythematous feet, onychomycosis  Lab results:  CBC Latest Ref Rng 01/15/2015 01/13/2015 02/22/2014  WBC 4.0 - 10.5 K/uL 6.0 5.9 6.9  Hemoglobin 13.0 - 17.0 g/dL 11.2(L) 11.1(L) 12.1(L)  Hematocrit 39.0 - 52.0 % 32.9(L) 33.7(L) 35.8(L)  Platelets 150 - 400 K/uL 292 224 252    BMP Latest Ref Rng 01/15/2015 01/13/2015 02/22/2014  Glucose 65 - 99 mg/dL 112(H) 115(H) 93  BUN 6 - 20 mg/dL '10 16 15  '$ Creatinine 0.61 - 1.24 mg/dL 0.86 0.84 0.94  Sodium 135 - 145 mmol/L 133(L) 132(L) 140  Potassium 3.5 - 5.1 mmol/L 3.6 4.1 4.8  Chloride 101 - 111 mmol/L 97(L) 97(L) 100  CO2 22 - 32 mmol/L '28 27 26  '$ Calcium 8.9 - 10.3 mg/dL 8.6(L) 8.9 9.2     Imaging results:    Radiology/Studies: Dg Chest 2 View 01/15/2015 CLINICAL DATA: 81 year old male with dizziness, AICD discharge today. Productive cough. Initial encounter. EXAM: CHEST 2 VIEW COMPARISON: 01/13/2015 and earlier. FINDINGS: Seated upright AP and lateral views of the chest. Stable left chest cardiac AICD. Stable cardiac size and mediastinal contours. No pneumothorax or pulmonary edema. Worsening confluence of opacity at the left lower lobe. Possible small pleural effusion. Ventilation elsewhere is stable. Nodular changes in the right apex again noted. Osteopenia. Lower thoracic compression fracture appears stable. Stable cholecystectomy clips. IMPRESSION: 1. Increasing opacity at the left lung base suspicious for pneumonia but might reflect worsening atelectasis. Possible small left pleural effusion. 2. Otherwise stable chest Electronically Signed By: Genevie Ann M.D. On: 01/15/2015 10:45   ESP:QZRAQT fibrillation with atrial undersensing, rate 76  TELEMETRY: sinus rhythm with frequent PAC's  Assessment & Plan by  Problem: Principal Problem:   Pneumonia involving left lung Active Problems:   Automatic implantable cardioverter-defibrillator in situ   Ischemic cardiomyopathy   Paroxysmal atrial fibrillation (HCC)   COPD with emphysema ([place])  Probably Left Lower Lobe Community Acquired Pneumonia: Patient has a 1 week history of productive cough and general malaise. The patient reports his sputum is white, yellow or green. Patient also reported recent vomiting and diarrhea. Chest X-ray showed left lower lobe consolidation with significant changes since previous chest xray on 01/12/2014. There is evidence on physical exam of left lower lobe consolidation. Patient is afebrile, but this is not uncommon in elderly patients.  - Daily chest x-ray - continue to trend CBC, BMP - f/u sputum culture - f/u blood culture - start ceftriazone 1g x 1 dose, transition to fluoroquinolone on discharge - f/u  procalcitonin  Hypotension:  - MIVF at 75 mL/hr - follow up orthostatic vitals   ICD shocks: The patient received inappropriate ICD therapy for atrial tachycardia/SVT in the context of decreased Sotalol dosing - Cardiology Following, appreciate assistance - Switch patient to amiodarone - Device reprogrammed to a VF zone of 220 bpm  Paroxysmal atrial fibrillation: Newly identified on device interrogation, CHADS2VASC is at least 5 - Patient has history of failing, unlikely to begin anticoagulation, will defer to cardiology  Malnutrition - 20 lb unintentional weight loss over the past year - consider consulting nutrition  Diet: Soft  Code: Full  Emergency Contact: Daughter [name], or son Konrad Dolores  Dispo: Disposition is deferred at this time, awaiting improvement of current medical problems. Anticipated discharge in approximately 1 day(s).   The patient does have a current PCP Janie Morning, DO) and does need an [facility] hospital follow-up appointment after discharge.  The patient does not have transportation limitations that hinder transportation to clinic appointments.  Signed: Colin Ina, Med Student 01/15/2015, 5:04 PM

## 2015-01-16 ENCOUNTER — Observation Stay (HOSPITAL_COMMUNITY): Payer: Medicare HMO

## 2015-01-16 DIAGNOSIS — R1310 Dysphagia, unspecified: Secondary | ICD-10-CM

## 2015-01-16 DIAGNOSIS — I471 Supraventricular tachycardia: Secondary | ICD-10-CM

## 2015-01-16 DIAGNOSIS — Z0389 Encounter for observation for other suspected diseases and conditions ruled out: Secondary | ICD-10-CM | POA: Diagnosis not present

## 2015-01-16 DIAGNOSIS — J984 Other disorders of lung: Secondary | ICD-10-CM | POA: Diagnosis not present

## 2015-01-16 DIAGNOSIS — J189 Pneumonia, unspecified organism: Secondary | ICD-10-CM

## 2015-01-16 DIAGNOSIS — I255 Ischemic cardiomyopathy: Secondary | ICD-10-CM

## 2015-01-16 DIAGNOSIS — I4891 Unspecified atrial fibrillation: Secondary | ICD-10-CM

## 2015-01-16 DIAGNOSIS — I480 Paroxysmal atrial fibrillation: Secondary | ICD-10-CM | POA: Diagnosis not present

## 2015-01-16 DIAGNOSIS — E46 Unspecified protein-calorie malnutrition: Secondary | ICD-10-CM

## 2015-01-16 LAB — COMPREHENSIVE METABOLIC PANEL
ALBUMIN: 2.7 g/dL — AB (ref 3.5–5.0)
ALT: 8 U/L — ABNORMAL LOW (ref 17–63)
ANION GAP: 8 (ref 5–15)
AST: 14 U/L — ABNORMAL LOW (ref 15–41)
Alkaline Phosphatase: 54 U/L (ref 38–126)
BILIRUBIN TOTAL: 0.5 mg/dL (ref 0.3–1.2)
BUN: 10 mg/dL (ref 6–20)
CO2: 27 mmol/L (ref 22–32)
Calcium: 8.2 mg/dL — ABNORMAL LOW (ref 8.9–10.3)
Chloride: 99 mmol/L — ABNORMAL LOW (ref 101–111)
Creatinine, Ser: 0.77 mg/dL (ref 0.61–1.24)
GFR calc Af Amer: >60 mL/min (ref >60)
GFR calc non Af Amer: >60 mL/min (ref >60)
GLUCOSE: 99 mg/dL (ref 65–99)
POTASSIUM: 3.9 mmol/L (ref 3.5–5.1)
SODIUM: 134 mmol/L — AB (ref 135–145)
TOTAL PROTEIN: 5.6 g/dL — AB (ref 6.5–8.1)

## 2015-01-16 LAB — MRSA PCR SCREENING: MRSA by PCR: POSITIVE — AB

## 2015-01-16 LAB — GLUCOSE, CAPILLARY: Glucose-Capillary: 115 mg/dL — ABNORMAL HIGH (ref 65–99)

## 2015-01-16 MED ORDER — AMIODARONE LOAD VIA INFUSION
150.0000 mg | Freq: Once | INTRAVENOUS | Status: AC
  Administered 2015-01-16: 150 mg via INTRAVENOUS
  Filled 2015-01-16: qty 83.34

## 2015-01-16 MED ORDER — FAMOTIDINE 20 MG PO TABS
10.0000 mg | ORAL_TABLET | Freq: Once | ORAL | Status: AC
  Administered 2015-01-16: 10 mg via ORAL
  Filled 2015-01-16: qty 1

## 2015-01-16 MED ORDER — RESOURCE THICKENUP CLEAR PO POWD
ORAL | Status: DC | PRN
  Filled 2015-01-16: qty 125

## 2015-01-16 MED ORDER — ENSURE ENLIVE PO LIQD
237.0000 mL | Freq: Three times a day (TID) | ORAL | Status: DC
  Administered 2015-01-16 – 2015-01-21 (×13): 237 mL via ORAL

## 2015-01-16 MED ORDER — AMIODARONE HCL IN DEXTROSE 360-4.14 MG/200ML-% IV SOLN
60.0000 mg/h | INTRAVENOUS | Status: AC
  Administered 2015-01-16 (×2): 60 mg/h via INTRAVENOUS
  Filled 2015-01-16: qty 200

## 2015-01-16 MED ORDER — MUPIROCIN 2 % EX OINT
1.0000 "application " | TOPICAL_OINTMENT | Freq: Two times a day (BID) | CUTANEOUS | Status: AC
  Administered 2015-01-16 – 2015-01-20 (×9): 1 via NASAL
  Filled 2015-01-16: qty 22

## 2015-01-16 MED ORDER — AMIODARONE HCL IN DEXTROSE 360-4.14 MG/200ML-% IV SOLN
30.0000 mg/h | INTRAVENOUS | Status: DC
  Administered 2015-01-17 – 2015-01-20 (×6): 30 mg/h via INTRAVENOUS
  Filled 2015-01-16 (×7): qty 200

## 2015-01-16 MED ORDER — DICLOFENAC SODIUM 1 % TD GEL
2.0000 g | Freq: Four times a day (QID) | TRANSDERMAL | Status: DC | PRN
  Filled 2015-01-16: qty 100

## 2015-01-16 MED ORDER — PANTOPRAZOLE SODIUM 40 MG PO PACK
40.0000 mg | PACK | Freq: Every day | ORAL | Status: DC
  Administered 2015-01-16 – 2015-01-21 (×6): 40 mg via ORAL
  Filled 2015-01-16 (×6): qty 20

## 2015-01-16 MED ORDER — POTASSIUM CHLORIDE 20 MEQ/15ML (10%) PO SOLN
20.0000 meq | Freq: Every day | ORAL | Status: DC
Start: 2015-01-16 — End: 2015-01-21
  Administered 2015-01-16 – 2015-01-21 (×6): 20 meq via ORAL
  Filled 2015-01-16 (×6): qty 15

## 2015-01-16 MED ORDER — METOPROLOL TARTRATE 1 MG/ML IV SOLN
INTRAVENOUS | Status: AC
  Administered 2015-01-16: 2.5 mg via INTRAVENOUS
  Filled 2015-01-16: qty 5

## 2015-01-16 MED ORDER — CHLORHEXIDINE GLUCONATE CLOTH 2 % EX PADS
6.0000 | MEDICATED_PAD | Freq: Every day | CUTANEOUS | Status: DC
  Administered 2015-01-17 – 2015-01-20 (×4): 6 via TOPICAL

## 2015-01-16 MED ORDER — DOXYCYCLINE HYCLATE 100 MG PO TABS
100.0000 mg | ORAL_TABLET | Freq: Two times a day (BID) | ORAL | Status: DC
  Administered 2015-01-16 – 2015-01-21 (×11): 100 mg via ORAL
  Filled 2015-01-16 (×11): qty 1

## 2015-01-16 MED ORDER — AMIODARONE HCL IN DEXTROSE 360-4.14 MG/200ML-% IV SOLN
INTRAVENOUS | Status: AC
  Administered 2015-01-16: 60 mg/h via INTRAVENOUS
  Filled 2015-01-16: qty 200

## 2015-01-16 MED ORDER — AMOXICILLIN-POT CLAVULANATE 875-125 MG PO TABS
1.0000 | ORAL_TABLET | Freq: Two times a day (BID) | ORAL | Status: DC
  Administered 2015-01-16 – 2015-01-21 (×11): 1 via ORAL
  Filled 2015-01-16 (×11): qty 1

## 2015-01-16 MED ORDER — KETOROLAC TROMETHAMINE 15 MG/ML IJ SOLN
15.0000 mg | Freq: Once | INTRAMUSCULAR | Status: AC
  Administered 2015-01-16: 15 mg via INTRAVENOUS
  Filled 2015-01-16: qty 1

## 2015-01-16 MED ORDER — METOPROLOL TARTRATE 1 MG/ML IV SOLN
2.5000 mg | Freq: Once | INTRAVENOUS | Status: AC
  Administered 2015-01-16: 2.5 mg via INTRAVENOUS

## 2015-01-16 NOTE — Progress Notes (Addendum)
RN notified that patient heart rate=190's, VTACH by CMT.  Went to assess patient, he was ambulating with PT stating he wasn't feeling well.  Patient assisted back to the room and back to bed.  EP notified.  VS and EKG obtained.  Patient was lethargic after this episode, Chanetta Marshall, NP notified of this change.  IV metoprolol 2.5 mg given per Cardiology, IV amiodarone started per cardiology.

## 2015-01-16 NOTE — Progress Notes (Signed)
Patient ID: George Acosta, male   DOB: 1928/06/10, 81 y.o.   MRN: [id number]  Date: 01/16/2015  Patient name: George Acosta  Medical record number: [id number]  Date of birth: [birth year]   This patient's plan of care was discussed with the house staff. Please see their note for complete details. I concur with their findings. George Acosta has not had any more tachycardia or defibrillator discharges overnight. He states that his cough is a little better and less productive. We will switch him to oral antibiotic therapy to complete treatment for probable left lower lobe community-acquired pneumonia. A barium swallow has been ordered.   Michel Bickers, MD 01/16/2015, 11:43 AM

## 2015-01-16 NOTE — Evaluation (Signed)
Clinical/Bedside Swallow Evaluation Patient Details  Name: George Acosta MRN: [id number] Date of Birth: [birth year]  Today's Date: 01/16/2015 Time: SLP Start Time (ACUTE ONLY): 0840 SLP Stop Time (ACUTE ONLY): 0900 SLP Time Calculation (min) (ACUTE ONLY): 20 min  Past Medical History:  Past Medical History  Diagnosis Date  . Coronary atherosclerosis of unspecified type of vessel, native or graft   . Unspecified essential hypertension   . Benign prostatic hypertrophy   . Peptic ulcer disease   . Diverticulosis   . GERD (gastroesophageal reflux disease)   . Ischemic cardiomyopathy   . Congestive heart failure, unspecified   . Arrhythmia     paroxysmal afib  . Ventricular tachycardia ([place])     A.   monomorphic VT  B. 04/2011 s/p generator change - [employer] ICD  [phone]  . MI (myocardial infarction) ([place]) 1985    "big MI after I got to hospital"  . Acute myocardial infarction, unspecified site, episode of care unspecified 1985    "on way to hospital"  . Pneumonia 1980's  . Anxiety   . Pancreatitis 1980's  . Paroxysmal atrial fibrillation (HCC)   . AICD (automatic cardioverter/defibrillator) present     [employer]  . Presence of permanent cardiac pacemaker   . COPD (chronic obstructive pulmonary disease) ([place])   . SOB (shortness of breath)     not recently  . Arthritis   . Lung mass   . Radiation 2/2,2/4,2/8,2/10,05/24/14    Right upper lobe 60 Gy in 5 fractions   Past Surgical History:  Past Surgical History  Procedure Laterality Date  . Icd  2001; 2007; 04/28/11    generator change; complete replacement; lead replacement  . Laparotomy  11/2005    for small bowel obstruction,  . Cardiac catheterization  11/23/2005  . Cardiac defibrillator placement  1996  . Cholecystectomy  1980's  . Cataract extraction  2012    left eye  . Video bronchoscopy with endobronchial navigation Right 02/27/2014    Procedure: VIDEO BRONCHOSCOPY WITH ENDOBRONCHIAL  NAVIGATION,insertion Feducial marker right upper lobe lung;  Surgeon: Collene Gobble, MD;  Location: [place];  Service: Thoracic;  Laterality: Right;  . Implantable cardioverter defibrillator (icd) generator change N/A 04/28/2011    Procedure: ICD GENERATOR CHANGE;  Surgeon: Evans Lance, MD;  Location[facility] C[facility] LAB;  Service: Cardiovascular;  Laterality: N/A;  . Lead revision N/A 04/28/2011    Procedure: LEAD REVISION;  Surgeon: Evans Lance, MD;  Location: [facility] CATH LAB;  Service: Cardiovascular;  Laterality: N/A;   HPI:  [name] is an 81 year old with coronary artery disease, chronic systolic heart failure and arrhythmias. He has been implantable defibrillator. He recently completed radiation therapy for right upper lobe lung cancer. He has had a poor appetite and some difficulty swallowing pills and solid food. He's had a 30 pound unintentional weight loss recently. He has had some dizziness with repeated shocks.  He mentioned that he had developed an increased productive cough over the past week. He has had some shivering but no documented fever. He denies any shortness of breath. His cough is productive of yellow-green sputum with a sour taste. His chest x-ray shows a new left lower lobe infiltrate.    Assessment / Plan / Recommendation Clinical Impression  Pt demonstrates delayed evidence of aspiration including cough, throat clear and wet vocal quality. This given complaint of globus and obvious difficulty masticating as well as finding of pna and weight loss warrant objective test. WIll  proceed with MBS today. Pt may continue current diet for now.     Aspiration Risk  Moderate    Diet Recommendation Age appropriate regular solids;Thin   Medication Administration: Whole meds with liquid    Other  Recommendations Oral Care Recommendations: Oral care BID   Follow Up Recommendations       Frequency and Duration        Pertinent Vitals/Pain NA    SLP Swallow Goals     Swallow  Study Prior Functional Status  Type of Home: House Available Help at Discharge: Family;Available 24 hours/day    General Other Pertinent Information: [name] is an 81 year old with coronary artery disease, chronic systolic heart failure and arrhythmias. He has been implantable defibrillator. He recently completed radiation therapy for right upper lobe lung cancer. He has had a poor appetite and some difficulty swallowing pills and solid food. He's had a 30 pound unintentional weight loss recently. He has had some dizziness with repeated shocks.  He mentioned that he had developed an increased productive cough over the past week. He has had some shivering but no documented fever. He denies any shortness of breath. His cough is productive of yellow-green sputum with a sour taste. His chest x-ray shows a new left lower lobe infiltrate.  Type of Study: Bedside swallow evaluation Diet Prior to this Study: Regular;Thin liquids Temperature Spikes Noted: No Respiratory Status: Room air History of Recent Intubation: No Behavior/Cognition: Alert;Cooperative Oral Cavity - Dentition: Edentulous Self-Feeding Abilities: Able to feed self Patient Positioning: Upright in chair/Tumbleform Baseline Vocal Quality: Wet;Hoarse Volitional Cough: Congested Volitional Swallow: Able to elicit    Oral/Motor/Sensory Function Overall Oral Motor/Sensory Function: Appears within functional limits for tasks assessed   Ice Chips     Thin Liquid Thin Liquid: Impaired Presentation: Cup;Self Fed Pharyngeal  Phase Impairments: Throat Clearing - Delayed;Cough - Delayed;Wet Vocal Quality    Nectar Thick Nectar Thick Liquid: Not tested   Honey Thick Honey Thick Liquid: Not tested   Puree Puree: Not tested   Solid   GO    Solid: Impaired Presentation: Self Fed Oral Phase Impairments: Impaired mastication Pharyngeal Phase Impairments: Throat Clearing - Delayed;Cough - Delayed;Wet Vocal Quality      Herbie [place], [place]  [id number] [phone]  Danaria Larsen, Katherene Ponto 01/16/2015,9:37 AM

## 2015-01-16 NOTE — Progress Notes (Signed)
  Amiodarone Drug - Drug Interaction Consult Note  Recommendations:  Prior to admission, this patient was taking Soltalol which is currently held.  H&P note on 10/5 indicates that his sotalol was changed to amiodarone.  Sotalol - avoid concomitant use with amiodarone.    Amiodarone is metabolized by the cytochrome P450 system and therefore has the potential to cause many drug interactions. Amiodarone has an average plasma half-life of 50 days (range 20 to 100 days).   There is potential for drug interactions to occur several weeks or months after stopping treatment and the onset of drug interactions may be slow after initiating amiodarone.   '[]'$  Statins: Increased risk of myopathy. Simvastatin- restrict dose to '20mg'$  daily. Other statins: counsel patients to report any muscle pain or weakness immediately.  '[]'$  Anticoagulants: Amiodarone can increase anticoagulant effect. Consider warfarin dose reduction. Patients should be monitored closely and the dose of anticoagulant altered accordingly, remembering that amiodarone levels take several weeks to stabilize.  '[]'$  Antiepileptics: Amiodarone can increase plasma concentration of phenytoin, the dose should be reduced. Note that small changes in phenytoin dose can result in large changes in levels. Monitor patient and counsel on signs of toxicity.  '[x]'$  Beta blockers: increased risk of bradycardia, AV block and myocardial depression. Sotalol - avoid concomitant use. NOTE: Patient was taking SOTALOL prior to admission. Currently held and receiving IV metoprolol 2.5 mg IV q6hr.   '[]'$   Calcium channel blockers (diltiazem and verapamil): increased risk of bradycardia, AV block and myocardial depression.  '[]'$   Cyclosporine: Amiodarone increases levels of cyclosporine. Reduced dose of cyclosporine is recommended.  '[]'$  Digoxin dose should be halved when amiodarone is started.  '[]'$  Diuretics: increased risk of cardiotoxicity if hypokalemia occurs.  '[]'$  Oral  hypoglycemic agents (glyburide, glipizide, glimepiride): increased risk of hypoglycemia. Patient's glucose levels should be monitored closely when initiating amiodarone therapy.   '[]'$  Drugs that prolong the QT interval:  Torsades de pointes risk may be increased with concurrent use - avoid if possible.  Monitor QTc, also keep magnesium/potassium WNL if concurrent therapy can't be avoided. Marland Kitchen Antibiotics: e.g. fluoroquinolones, erythromycin. . Antiarrhythmics: e.g. quinidine, procainamide, disopyramide, sotalol. . Antipsychotics: e.g. phenothiazines, haloperidol.  . Lithium, tricyclic antidepressants, and methadone.  Thank You,  Nicole Cella, [place] Clinical Pharmacist Pager: [phone] 01/16/2015 12:39 PM

## 2015-01-16 NOTE — Progress Notes (Signed)
Initial Nutrition Assessment  DOCUMENTATION CODES:   Severe malnutrition in context of chronic illness  INTERVENTION:    Ensure Enlive PO TID, each supplement provides 350 kcal and 20 grams of protein  Magic cup TID with meals, each supplement provides 290 kcal and 9 grams of protein  NUTRITION DIAGNOSIS:   Malnutrition related to chronic illness as evidenced by percent weight loss, severe depletion of body fat, severe depletion of muscle mass (11% weight loss within the past 6 months).  GOAL:   Patient will meet greater than or equal to 90% of their needs  MONITOR:   PO intake, Supplement acceptance, Labs, Weight trends  REASON FOR ASSESSMENT:   Consult Assessment of nutrition requirement/status  ASSESSMENT:   81 yo male with CAD, ICM s/p ICD placement, COPD, NSCLC s/p radiation, and h/o left femoral fracture, presenting after being shocked by his ICD 5 times today.  Patient lives with his son and daughter who were in the room during [place] visit. They report that patient has been eating poorly for the past few months. He has lost 11% of his usual weight over the past 6 months. Nutrition-Focused physical exam completed. Findings are severe fat depletion, severe muscle depletion, and no edema. Patient now requiring nectar thick liquids and chopped (dysphagia 2) solids. SLP following. Discussed ways to increase intake of calories and protein with son and daughter.   Diet Order:  DIET DYS 2 Room service appropriate?: Yes; Fluid consistency:: Nectar Thick  Skin:  Wound (see comment) (open wound to buttocks)  Last BM:  10/5  Height:   Ht Readings from Last 1 Encounters:  01/15/15 '5\' 7"'$  (1.702 m)    Weight:   Wt Readings from Last 1 Encounters:  01/16/15 129 lb 6.6 oz (58.7 kg)    Ideal Body Weight:  67.3 kg  BMI:  Body mass index is 20.26 kg/(m^2).  Estimated Nutritional Needs:   Kcal:  2774-1287  Protein:  85-100 gm  Fluid:  2 L  EDUCATION NEEDS:    Education needs addressed  Molli Barrows, [place], [place], [place] Pager [phone] After Hours Pager [phone]

## 2015-01-16 NOTE — Evaluation (Signed)
Occupational Therapy Evaluation Patient Details Name: George Acosta MRN: [id number] DOB: [birth year] Today's Date: 01/16/2015    History of Present Illness : [name] is an 81 yo male with CAD, ICM s/p ICD placement, COPD, NSCLC s/p radiation, and h/o left femoral fracture March 2016, presenting after being shocked by his ICD 5 times. Pt also reported cough with green sputum and difficulty swallowing. Dx of PNA.   Clinical Impression   Pt admitted with the above diagnosis and has the deficits listed below. Pt appears to be very close to his baseline status with adls. Will continue to work with pt acutely to achieve a mod I level with adls so he can safely d/c back home with his children who provide 24 hour S.      Follow Up Recommendations  No OT follow up;Supervision/Assistance - 24 hour    Equipment Recommendations  3 in 1 bedside comode    Recommendations for Other Services       Precautions / Restrictions Precautions Precautions: Fall Precaution Comments: Pt fell back in 06/2014 and broke L femur.  Has not fallen since but states he is unsteady at times. Restrictions Weight Bearing Restrictions: No      Mobility Bed Mobility Overal bed mobility: Modified Independent             General bed mobility comments: Pt mobilized in bed w/o assist but used bedrails.  Transfers Overall transfer level: Needs assistance Equipment used: Rolling walker (2 wheeled) Transfers: Sit to/from [facility] Sit to Stand: Supervision Stand pivot transfers: Supervision       General transfer comment: Pt required cues for hand position on first transfer but after was able to transfer with S only and no VCs.    Balance Overall balance assessment: Needs assistance Sitting-balance support: Feet supported Sitting balance-Leahy Scale: Good     Standing balance support: Bilateral upper extremity supported;During functional activity Standing balance-Leahy Scale:  Fair Standing balance comment: S provided and pt used walker.                            ADL Overall ADL's : Needs assistance/impaired Eating/Feeding: Supervision/ safety;Sitting Eating/Feeding Details (indicate cue type and reason): S provided for swallowing only. Grooming: Wash/dry hands;Wash/dry face;Oral care;Standing;Supervision/safety Grooming Details (indicate cue type and reason): pt stood at sink for 4 minutes wo assist. Upper Body Bathing: Set up;Sitting   Lower Body Bathing: Supervison/ safety;Sit to/from stand Lower Body Bathing Details (indicate cue type and reason): S only to come sit to stand. Upper Body Dressing : Set up;Sitting   Lower Body Dressing: Minimal assistance;Sit to/from stand Lower Body Dressing Details (indicate cue type and reason): pt  can cross L leg over R to donn socks and shoes although it appears to be painful. Toilet Transfer: Supervision/safety;Ambulation;Comfort height toilet;Grab bars Toilet Transfer Details (indicate cue type and reason): pt walked to bathroom with S. Toileting- Clothing Manipulation and Hygiene: Supervision/safety;Sit to/from stand       Functional mobility during ADLs: Supervision/safety;Rolling walker General ADL Comments: Pt appears to be very close to baseline with adls.  S given since this was first encounter with this pt.  Pt overall fatigues quickly and needs S due to past fall.     Vision Vision Assessment?: No apparent visual deficits   Perception     Praxis      Pertinent Vitals/Pain Pain Assessment: No/denies pain     Hand Dominance Right  Extremity/Trunk Assessment Upper Extremity Assessment Upper Extremity Assessment: Generalized weakness   Lower Extremity Assessment Lower Extremity Assessment: Defer to PT evaluation   Cervical / Trunk Assessment Cervical / Trunk Assessment: Kyphotic   Communication Communication Communication: No difficulties   Cognition Arousal/Alertness:  Awake/alert Behavior During Therapy: WFL for tasks assessed/performed Overall Cognitive Status: Within Functional Limits for tasks assessed                     General Comments       Exercises       Shoulder Instructions      Home Living Family/patient expects to be discharged to:: Private residence Living Arrangements: Children Available Help at Discharge: Family;Available 24 hours/day Type of Home: House Home Access: Ramped entrance     Home Layout: One level     Bathroom Shower/Tub: Walk-in shower;Other (comment) (sponge bathes only)   Bathroom Toilet: Standard Bathroom Accessibility: Yes How Accessible: Accessible via walker Home Equipment: Bedside commode;Cane - single point;Walker - 2 wheels   Additional Comments: Pt has 3:1 over one commode and would like a 3:1 over the other if it is covered.      Prior Functioning/Environment Level of Independence: Needs assistance  Gait / Transfers Assistance Needed: walkes with walker  ADL's / Homemaking Assistance Needed: daughter does all cooking and homemaking. Pt does basic adls with mod I.        OT Diagnosis: Generalized weakness   OT Problem List: Decreased strength;Decreased safety awareness;Decreased knowledge of use of DME or AE   OT Treatment/Interventions: Self-care/ADL training;Therapeutic activities;DME and/or AE instruction    OT Goals(Current goals can be found in the care plan section) Acute Rehab OT Goals Patient Stated Goal: to go home. OT Goal Formulation: With patient Time For Goal Achievement: 01/30/15 Potential to Achieve Goals: Good ADL Goals Pt Will Perform Lower Body Bathing: with modified independence;sit to/from stand Pt Will Perform Lower Body Dressing: with modified independence;sit to/from stand Additional ADL Goal #1: Pt will toilet wtih 3:1 over commode with mod I.  OT Frequency: Min 2X/week   Barriers to D/C:            Co-evaluation              End of  Session Equipment Utilized During Treatment: Rolling walker Nurse Communication: Mobility status  Activity Tolerance: Patient tolerated treatment well Patient left: in chair;with call bell/phone within reach   Time: 0850-0913 OT Time Calculation (min): 23 min Charges:  OT General Charges $OT Visit: 1 Procedure OT Evaluation $Initial OT Evaluation Tier I: 1 Procedure G-Codes: OT G-codes **NOT FOR INPATIENT CLASS** Functional Assessment Tool Used: clinical judgement Functional Limitation: Self care Self Care Current Status (X3244): At least 1 percent but less than 20 percent impaired, limited or restricted Self Care Goal Status (W1027): At least 1 percent but less than 20 percent impaired, limited or restricted  Glenford Peers 01/16/2015, 9:31 AM  [phone]

## 2015-01-16 NOTE — Evaluation (Addendum)
Physical Therapy Evaluation Patient Details Name: George Acosta MRN: [id number] DOB: 07/20/1928 Today's Date: 01/16/2015   History of Present Illness  : George Acosta is an 81 yo male with CAD, ICM s/p ICD placement, COPD, NSCLC s/p radiation, and h/o left femoral fracture March 2016, presenting after being shocked by his ICD 5 times. Pt also reported cough with green sputum and difficulty swallowing. Dx of PNA.  Clinical Impression  Pt admitted with above diagnosis. Pt currently with functional limitations due to the deficits listed below (see PT Problem List). Pt ambulated 100' with stable vital signs, then stated he didn't feel well, HR was 198, SaO2 84% on RA. ASsisted pt to [facility] and then back to bed. RN resumed care. Pt appears to be near baseline with mobility. Pt will benefit from skilled PT to increase their independence and safety with mobility to allow discharge to the venue listed below.       Follow Up Recommendations No PT follow up    Equipment Recommendations  None recommended by PT    Recommendations for Other Services       Precautions / Restrictions Precautions Precautions: Fall Precaution Comments: Pt fell back in 06/2014 and broke L femur.  Has not fallen since but states he is unsteady at times. Restrictions Weight Bearing Restrictions: No      Mobility  Bed Mobility Overal bed mobility: Modified Independent             General bed mobility comments: Pt mobilized in bed w/o assist but used bedrails.  Transfers Overall transfer level: Needs assistance Equipment used: Rolling walker (2 wheeled) Transfers: Sit to/from [facility] Sit to Stand: Supervision Stand pivot transfers: Supervision       General transfer comment: good hand placement  Ambulation/Gait Ambulation/Gait assistance: Supervision Ambulation Distance (Feet): 100 Feet Assistive device: Rolling walker (2 wheeled) Gait Pattern/deviations: Step-through pattern;Decreased  stride length   Gait velocity interpretation: at or above normal speed for age/gender General Gait Details: Pt's vital signs were stable while walking first 100', he then reported not feeling well, HR was 198, SaO2 84% on RA. Pt assisted to [facility] and returned to bed. RN aware and took over care.  Stairs            Wheelchair Mobility    Modified Rankin (Stroke Patients Only)       Balance Overall balance assessment: Needs assistance Sitting-balance support: Feet supported Sitting balance-Leahy Scale: Good     Standing balance support: Bilateral upper extremity supported;During functional activity Standing balance-Leahy Scale: Fair Standing balance comment: S provided and pt used walker.                             Pertinent Vitals/Pain Pain Assessment: Faces Pain Score: 5  Pain Location: L knee with walking  Pain Descriptors / Indicators: Sore Pain Intervention(s): Monitored during session;Limited activity within patient's tolerance (pt declined pain meds, stated L knee pain started after L hip fx in March)    Home Living Family/patient expects to be discharged to:: Private residence Living Arrangements: Children Available Help at Discharge: Family;Available 24 hours/day Type of Home: House Home Access: Ramped entrance     Home Layout: One level Home Equipment: Bedside commode;Cane - single point;Walker - 2 wheels Additional Comments: Pt has 3:1 over one commode and would like a 3:1 over the other if it is covered.    Prior Function Level of Independence: Needs assistance  Gait / Transfers Assistance Needed: walks with walker independently at home  ADL's / Homemaking Assistance Needed: daughter does all cooking and homemaking. Pt does basic adls with mod I.        Hand Dominance   Dominant Hand: Right    Extremity/Trunk Assessment   Upper Extremity Assessment: Generalized weakness           Lower Extremity Assessment: Overall WFL for  tasks assessed (L knee extension +4/5, R knee extension 5/5)      Cervical / Trunk Assessment: Kyphotic  Communication   Communication: No difficulties  Cognition Arousal/Alertness: Awake/alert Behavior During Therapy: WFL for tasks assessed/performed Overall Cognitive Status: Within Functional Limits for tasks assessed                      General Comments General comments (skin integrity, edema, etc.): Pt doing generally well with basic adls.  Encouraged safety with walker and talked at length about how to make home set up safer.    Exercises        Assessment/Plan    PT Assessment Patient needs continued PT services  PT Diagnosis Generalized weakness   PT Problem List Cardiopulmonary status limiting activity;Decreased activity tolerance;Pain  PT Treatment Interventions Gait training;Functional mobility training;Therapeutic activities;Patient/family education;Therapeutic exercise   PT Goals (Current goals can be found in the Care Plan section) Acute Rehab PT Goals Patient Stated Goal: to go home. PT Goal Formulation: With patient/family Time For Goal Achievement: 01/30/15 Potential to Achieve Goals: Fair    Frequency Min 3X/week   Barriers to discharge        Co-evaluation               End of Session Equipment Utilized During Treatment: Gait belt Activity Tolerance: Treatment limited secondary to medical complications (Comment) (pt went into V Tach ) Patient left: in bed;with call bell/phone within reach;with nursing/sitter in room      Functional Assessment Tool Used: clinical judgement Functional Limitation: Mobility: Walking and moving around Mobility: Walking and Moving Around Current Status (H9977): At least 1 percent but less than 20 percent impaired, limited or restricted Mobility: Walking and Moving Around Goal Status [phone]): At least 1 percent but less than 20 percent impaired, limited or restricted    Time: 9532-0233 PT Time  Calculation (min) (ACUTE ONLY): 17 min   Charges:   PT Evaluation $Initial PT Evaluation Tier I: 1 Procedure     PT G Codes:   PT G-Codes **NOT FOR INPATIENT CLASS** Functional Assessment Tool Used: clinical judgement Functional Limitation: Mobility: Walking and moving around Mobility: Walking and Moving Around Current Status (I3568): At least 1 percent but less than 20 percent impaired, limited or restricted Mobility: Walking and Moving Around Goal Status [phone]): At least 1 percent but less than 20 percent impaired, limited or restricted    Philomena Doheny 01/16/2015, 11:47 AM [phone]

## 2015-01-16 NOTE — Discharge Summary (Signed)
Name: Dodger Sinning Bewick MRN: [id number] DOB: March 10, 1929 81 y.o. PCP: Janie Morning, DO  Date of Admission: 01/15/2015  9:20 AM Date of Discharge: 01/21/2015 Attending Physician: Michel Bickers, MD  Discharge Diagnosis: 1. Left lower lobe community acquired pneumonia 2. Atrial fibrillation with rapid ventricular rate   Principal Problem:   Paroxysmal atrial fibrillation (HCC) Active Problems:   Left lower lobe pneumonia   Protein-calorie malnutrition, severe (HCC)   GERD   Dysphagia, oropharyngeal phase   Automatic implantable cardioverter-defibrillator in situ   Ischemic cardiomyopathy   Defibrillator discharge   Lung mass RUL 1.1x 1.2 cm    COPD with emphysema ([place])   Acute delirium   Palliative care encounter   Counseling regarding end of life decision making   Dyspnea   DNR (do not resuscitate) discussion   Community acquired pneumonia  Discharge Medications:   Medication List    STOP taking these medications        pantoprazole 40 MG tablet  Commonly known as:  PROTONIX  Replaced by:  pantoprazole sodium 40 mg/20 mL Pack     sotalol 80 MG tablet  Commonly known as:  BETAPACE      TAKE these medications        acetaminophen 325 MG tablet  Commonly known as:  TYLENOL  Take 650 mg by mouth every 6 (six) hours as needed for mild pain.     amiodarone 200 MG tablet  Commonly known as:  PACERONE  Take 1 tablet (200 mg total) by mouth 2 (two) times daily. Take 1 tab twice daily until 10/24.  Then take 1 tab once daily.     feeding supplement (ENSURE ENLIVE) Liqd  Take 237 mLs by mouth 3 (three) times daily between meals.     guaiFENesin-codeine 100-10 MG/5ML syrup  Commonly known as:  ROBITUSSIN AC  Take 5 mLs by mouth 3 (three) times daily as needed for cough.     LORazepam 0.5 MG tablet  Commonly known as:  ATIVAN  Take 0.25 mg by mouth 2 (two) times daily.     multivitamin with minerals Tabs tablet  Take 1 tablet by mouth daily.     nitroGLYCERIN 0.4  MG SL tablet  Commonly known as:  NITROSTAT  Place 1 tablet (0.4 mg total) under the tongue every 5 (five) minutes as needed. For chest pain     pantoprazole sodium 40 mg/20 mL Pack  Commonly known as:  PROTONIX  Take 20 mLs (40 mg total) by mouth daily.     potassium chloride SA 20 MEQ tablet  Commonly known as:  K-DUR,KLOR-CON  Take 20 mEq by mouth daily.     ranitidine 150 MG tablet  Commonly known as:  ZANTAC  Take 150 mg by mouth at bedtime.        Disposition and follow-up:   Mr.Balen A Rodriges was discharged from [facility] in Stable condition.  At the hospital follow up visit please address:  1.  Rate control on Amiodarone, resolution of pneumonia, weight  2.  Labs / imaging needed at time of follow-up: CXR in 4 weeks (02/20/15)  3.  Pending labs/ test needing follow-up: none  Follow-up Appointments: Follow-up Information    Follow up with COLLINS, DANA, DO On 01/24/2015.   Specialty:  Family Medicine   Why:  2:45 PM   Contact information:   [address] [place] [address] [phone]       Follow up with Cristopher Peru, MD On  02/06/2015.   Specialty:  Cardiology   Why:  at 4:30PM   Contact information:   [address] [place] [address] [phone]       Follow up with [facility]   Why:  Physical Therapy and SLP    Contact information:   [address] [address] [phone]       Discharge Instructions: Discharge Instructions    Diet - low sodium heart healthy    Complete by:  As directed      Increase activity slowly    Complete by:  As directed            Consultations: Treatment Team:  [facility], MD Palliative [facility]  Procedures Performed:  Dg Chest 2 View  01/19/2015   CLINICAL DATA:  Shortness of breath. Followup left base lung disease.  EXAM: CHEST  2 VIEW  COMPARISON:  01/15/2015  FINDINGS: Pacemaker/AICD  remains in place, but not visibly changed. The right lung remains clear. There is marked improvement in aeration of the left lower lobe. No worsening or new findings.  IMPRESSION: Marked improvement in aeration of the left lower lobe since 4 days ago.   Electronically Signed   By: Nelson Chimes M.D.   On: 01/19/2015 09:03   Dg Chest 2 View  01/15/2015   CLINICAL DATA:  81 year old male with dizziness, AICD discharge today. Productive cough. Initial encounter.  EXAM: CHEST  2 VIEW  COMPARISON:  01/13/2015 and earlier.  FINDINGS: Seated upright AP and lateral views of the chest. Stable left chest cardiac AICD. Stable cardiac size and mediastinal contours. No pneumothorax or pulmonary edema. Worsening confluence of opacity at the left lower lobe. Possible small pleural effusion. Ventilation elsewhere is stable. Nodular changes in the right apex again noted. Osteopenia. Lower thoracic compression fracture appears stable. Stable cholecystectomy clips.  IMPRESSION: 1. Increasing opacity at the left lung base suspicious for pneumonia but might reflect worsening atelectasis. Possible small left pleural effusion. 2. Otherwise stable chest   Electronically Signed   By: Genevie Ann M.D.   On: 01/15/2015 10:45   Dg Chest 2 View  01/13/2015   CLINICAL DATA:  Defibrillator activation while lying in bed. History of CHF and myocardial infarction.  EXAM: CHEST  2 VIEW  COMPARISON:  Chest radiograph January 09, 2015 and CT chest December 12, 2014.  FINDINGS: Cardiomediastinal silhouette is unchanged. Mild bronchitic changes, improved aeration LEFT lung base with strandy densities. No pleural effusion or focal consolidative the patient. Small nodule projects in the LEFT lung base, more conspicuous than prior CT. Fibronodular scarring RIGHT upper lobe. LEFT cardiac defibrillator in situ. No pneumothorax. Surgical clip projects in RIGHT lung apex. Patient is osteopenic. Surgical clips in the included right abdomen compatible with  cholecystectomy.  IMPRESSION: No active cardiopulmonary disease.  LEFT lung base atelectasis/scarring in a background of bronchitic changes/COPD.  Increasing conspicuity LEFT lower lobe nodule. Please see CT chest December 12, 2014.   Electronically Signed   By: Elon Alas M.D.   On: 01/13/2015 04:31   Dg Swallowing Func-speech Pathology  01/16/2015    Objective Swallowing Evaluation:    Patient Details  Name: Wendelin Bradt Fuhrmann MRN: [id number] Date of Birth: [birth year]  Today's Date: 01/16/2015 Time: SLP Start Time (ACUTE ONLY): 1030-SLP Stop Time (ACUTE ONLY): 1100 SLP Time Calculation (min) (ACUTE ONLY): 30 min  Past Medical History:  Past Medical History  Diagnosis Date  .  Coronary atherosclerosis of unspecified type of vessel, native or graft    . Unspecified essential hypertension   . Benign prostatic hypertrophy   . Peptic ulcer disease   . Diverticulosis   . GERD (gastroesophageal reflux disease)   . Ischemic cardiomyopathy   . Congestive heart failure, unspecified   . Arrhythmia     paroxysmal afib  . Ventricular tachycardia ([place])     A.   monomorphic VT  B. 04/2011 s/p generator change - [employer] ICD  [phone]  . MI (myocardial infarction) ([place]) 1985    "big MI after I got to hospital"  . Acute myocardial infarction, unspecified site, episode of care  unspecified 1985    "on way to hospital"  . Pneumonia 1980's  . Anxiety   . Pancreatitis 1980's  . Paroxysmal atrial fibrillation (HCC)   . AICD (automatic cardioverter/defibrillator) present     [employer]  . Presence of permanent cardiac pacemaker   . COPD (chronic obstructive pulmonary disease) ([place])   . SOB (shortness of breath)     not recently  . Arthritis   . Lung mass   . Radiation 2/2,2/4,2/8,2/10,05/24/14    Right upper lobe 60 Gy in 5 fractions   Past Surgical History:  Past Surgical History  Procedure Laterality Date  . Icd  2001; 2007; 04/28/11    generator change; complete replacement; lead replacement  . Laparotomy   11/2005    for small bowel obstruction,  . Cardiac catheterization  11/23/2005  . Cardiac defibrillator placement  1996  . Cholecystectomy  1980's  . Cataract extraction  2012    left eye  . Video bronchoscopy with endobronchial navigation Right 02/27/2014    Procedure: VIDEO BRONCHOSCOPY WITH ENDOBRONCHIAL NAVIGATION,insertion  Feducial marker right upper lobe lung;  Surgeon: Collene Gobble, MD;   Location: [facility];  Service: Thoracic;  Laterality: Right;  . Implantable cardioverter defibrillator (icd) generator change N/A  04/28/2011    Procedure: ICD GENERATOR CHANGE;  Surgeon: Evans Lance, MD;   Location[facility] C[facility] LAB;  Service: Cardiovascular;  Laterality: N/A;  . Lead revision N/A 04/28/2011    Procedure: LEAD REVISION;  Surgeon: Evans Lance, MD;  Location: [facility]  CATH LAB;  Service: Cardiovascular;  Laterality: N/A;   HPI:  Other Pertinent Information: Mr. Rail is an 81 year old with coronary  artery disease, chronic systolic heart failure and arrhythmias. He has  been implantable defibrillator. He recently completed radiation therapy  for right upper lobe lung cancer. He has had a poor appetite and some  difficulty swallowing pills and solid food. He's had a 30 pound  unintentional weight loss recently. He has had some dizziness with  repeated shocks.  He mentioned that he had developed an increased  productive cough over the past week. He has had some shivering but no  documented fever. He denies any shortness of breath. His cough is  productive of yellow-green sputum with a sour taste. His chest x-ray shows  a new left lower lobe infiltrate.   No Data Recorded  Assessment / Plan / Recommendation CHL IP CLINICAL IMPRESSIONS 01/16/2015  Therapy Diagnosis Mild oral phase dysphagia;Moderate pharyngeal phase  dysphagia  Clinical Impression Pt demosntrates a moderate dysphagia with slow but  adequate oral function due to missing dentition. Oropharyngeal phase is  impaired and characterized by weakness across the  hyolaryngeal mechanism  resulting in penetration of thin liquids during the swallow and  penetration of nectar and thin after the swallow  due to residuals.  Sensation is intermittent, but pt is able to clear most penetrate with  cues for throat clearing. Bolus transit and airway protection is much  improved with nectar thick liquids and soft solids when pt completes a  chin tuck and swallows twice. Pt continues to report globus when residuals  cleared, likely due to appearance of stasis with with distal esopahgus (no  radiologist present to confirm). Pt is resistant to teaching and is  unlikely to follow recommendations after d/c home. Requested f/u with home  health SLP to help pt and family implement diet recommendations and  strategies if possible.       CHL IP TREATMENT RECOMMENDATION 01/16/2015  Treatment Recommendations Therapy as outlined in treatment plan below     CHL IP DIET RECOMMENDATION 01/16/2015  SLP Diet Recommendations Dysphagia 2 (Fine chop);Nectar  Liquid Administration via (None)  Medication Administration Crushed with puree  Compensations Chin tuck;Slow rate;Small sips/bites;Multiple dry swallows  after each bite/sip;Clear throat intermittently  Postural Changes and/or Swallow Maneuvers (None)     CHL IP OTHER RECOMMENDATIONS 01/16/2015  Recommended Consults (None)  Oral Care Recommendations Oral care BID  Other Recommendations Order thickener from pharmacy     No flowsheet data found.   No flowsheet data found.   Pertinent Vitals/Pain NA    SLP Swallow Goals No flowsheet data found.  No flowsheet data found.    CHL IP REASON FOR REFERRAL 01/16/2015  Reason for Referral Objectively evaluate swallowing function     CHL IP ORAL PHASE 01/16/2015  Lips (None)  Tongue (None)  Mucous membranes (None)  Nutritional status (None)  Other (None)  Oxygen therapy (None)  Oral Phase WFL  Oral - Pudding Teaspoon (None)  Oral - Pudding Cup (None)  Oral - Honey Teaspoon (None)  Oral - Honey Cup (None)  Oral - Honey  Syringe (None)  Oral - Nectar Teaspoon (None)  Oral - Nectar Cup (None)  Oral - Nectar Straw (None)  Oral - Nectar Syringe (None)  Oral - Ice Chips (None)  Oral - Thin Teaspoon (None)  Oral - Thin Cup (None)  Oral - Thin Straw (None)  Oral - Thin Syringe (None)  Oral - Puree (None)  Oral - Mechanical Soft (None)  Oral - Regular (None)  Oral - Multi-consistency (None)  Oral - Pill (None)  Oral Phase - Comment (None)      CHL IP PHARYNGEAL PHASE 01/16/2015  Pharyngeal Phase Impaired  Pharyngeal - Pudding Teaspoon (None)  Penetration/Aspiration details (pudding teaspoon) (None)  Pharyngeal - Pudding Cup (None)  Penetration/Aspiration details (pudding cup) (None)  Pharyngeal - Honey Teaspoon (None)  Penetration/Aspiration details (honey teaspoon) (None)  Pharyngeal - Honey Cup (None)  Penetration/Aspiration details (honey cup) (None)  Pharyngeal - Honey Syringe (None)  Penetration/Aspiration details (honey syringe) (None)  Pharyngeal - Nectar Teaspoon (None)  Penetration/Aspiration details (nectar teaspoon) (None)  Pharyngeal - Nectar Cup (None)  Penetration/Aspiration details (nectar cup) (None)  Pharyngeal - Nectar Straw (None)  Penetration/Aspiration details (nectar straw) (None)  Pharyngeal - Nectar Syringe (None)  Penetration/Aspiration details (nectar syringe) (None)  Pharyngeal - Ice Chips (None)  Penetration/Aspiration details (ice chips) (None)  Pharyngeal - Thin Teaspoon (None)  Penetration/Aspiration details (thin teaspoon) (None)  Pharyngeal - Thin Cup (None)  Penetration/Aspiration details (thin cup) (None)  Pharyngeal - Thin Straw (None)  Penetration/Aspiration details (thin straw) (None)  Pharyngeal - Thin Syringe (None)  Penetration/Aspiration details (thin syringe') (None)  Pharyngeal - Puree (None)  Penetration/Aspiration details (puree) (None)  Pharyngeal -  Mechanical Soft (None)  Penetration/Aspiration details (mechanical soft) (None)  Pharyngeal - Regular (None)  Penetration/Aspiration details  (regular) (None)  Pharyngeal - Multi-consistency (None)  Penetration/Aspiration details (multi-consistency) (None)  Pharyngeal - Pill (None)  Penetration/Aspiration details (pill) (None)  Pharyngeal Comment (None)      No flowsheet data found.  CHL IP GO 01/16/2015  Functional Assessment Tool Used clinical judgement  Functional Limitations Swallowing  Swallow Current Status [phone]) CK  Swallow Goal Status (X9024) CJ  Swallow Discharge Status [phone]) (None)  Motor Speech Current Status [phone]) (None)  Motor Speech Goal Status [phone]) (None)  Motor Speech Goal Status (M1962) (None)  Spoken Language Comprehension Current Status (I2979) (None)  Spoken Language Comprehension Goal Status (G9211) (None)  Spoken Language Comprehension Discharge Status [phone]) (None)  Spoken Language Expression Current Status [phone]) (None)  Spoken Language Expression Goal Status [phone]) (None)  Spoken Language Expression Discharge Status [phone]) (None)  Attention Current Status (W2637) (None)  Attention Goal Status (C5885) (None)  Attention Discharge Status [phone]) (None)  Memory Current Status (J2878) (None)  Memory Goal Status (M7672) (None)  Memory Discharge Status (C9470) (None)  Voice Current Status (J6283) (None)  Voice Goal Status (M6294) (None)  Voice Discharge Status [phone]) (None)  Other Speech-Language Pathology Functional Limitation [phone]) (None)  Other Speech-Language Pathology Functional Limitation Goal Status (S5681)  (None)  Other Speech-Language Pathology Functional Limitation Discharge Status  [phone]) (None)          Herbie Baltimore, MA CCC-SLP [phone]  DeBlois, Katherene Ponto 01/16/2015, 12:24 PM     2D Echo: none  Cardiac Cath: none  Admission HPI: Mr. Coon is an 81 yo male with CAD, ICM s/p ICD placement, COPD, NSCLC s/p radiation, and h/o left femoral fracture, presenting after being shocked by his ICD 5 times today. Patient was walking into his house today when he started to feel dizzy and was shocked by his  ICD. He denies CP, SOB, or palpitations prior to being shocked. ICD interrogation in the ED revealed 5 inappropriate shocks due to misinterpretation of SVT as VT. He has a h/o afib, rate controlled on Sotalol. However, he was recently seen at [facility], where his Sotalol was halved from 160 BID to 80 BID due to hypotension.   Patient says he has been having a 1 week h/o cough productive yellow/green sputum. He denies fever, chills, or night sweats. He denies recent antibiotic use, except a couple day course of Ciprofloxacin he received for possibly cystitis. However, he stopped taking the Cipro due to his "heart feeling funny."   He has been having trouble swallowing, and is unable to swallow his ASA or vitamin. He does not have a problem with water, soft foods, or his other pills. However, he will sometimes cough up meat or other solid foods. Patient's daughter reports he underwent EGD within the last couple years, which was normal.  He has a h/o NSCLC and recently finished radiation therapy. He has had a 20 lb unintentional weight loss over the last year due to decreased appetite. He says he does not eat a lot of food. He mostly drinks coffee. He denies feeling dizzy when he stands up.  He had a recent fall in March 2016, where he broke his left hip. He underwent rehab in SNF until April.   He otherwise denies N/V, constipation, diarrhea, abdominal pain, dysuria, or leg swelling.   Hospital Course by problem list: Principal Problem:   Paroxysmal atrial fibrillation (HCC) Active Problems:   Left lower lobe  pneumonia   Protein-calorie malnutrition, severe (HCC)   GERD   Dysphagia, oropharyngeal phase   Automatic implantable cardioverter-defibrillator in situ   Ischemic cardiomyopathy   Defibrillator discharge   Lung mass RUL 1.1x 1.2 cm    COPD with emphysema ([place])   Acute delirium   Palliative care encounter   Counseling regarding end of life decision making    Dyspnea   DNR (do not resuscitate) discussion   Community acquired pneumonia   LLL CAP: Patient was stable on RA during admission, with improvement in cough.  He received one day Ceftriaxone and was transitioned to PO Doxycycline and Augmentin for 6 additional days 10/6-10/11).  Cough improved, minimally productive of white sputum on day of discharge.  Physical exam still demonstrated decreased breath sounds in LLL, but repeat CXR on 10/9 showed improvement in consolidation.  Please obtain CXR in approximately 4 weeks to ensure resolution of consolidation.  Afib/SVT: Patient presented in atrial fibrillation, after have recurrent RVR/SVT episodes that precipitated inappropriate ICD shocks.  Patient's Sotalol was switched to Amiodarone PO.  During admission, patient ambulated with PT and experienced recurrent SVT arrhythmia.  Patient was started on Amiodarone drip and IV Metoprolol for rate control.  He was transitioned to PO Amiodarone on 10/7. Unfortunately, patient experienced RVR while ambulating, requiring 2 days of Amiodarone drip and Digoxin for added rate control.  His HR remained elevated at 110-160, until he spontaneously converted back to normal sinus rhythm.  He was transitioned to Amiodarone 200 mg PO BID, after which he would experience paroxysmal atrial fibrillation without RVR.  His HR remained 60-90 on day of discharge.  He ambulated without cardiopulmonary complaint, only noting weak legs from lying in bed.  He is to remain on Amiodarone 200 mg PO BID for 2 weeks, and then switch to Amiodarone 200 mg PO once daily. He will follow up with his Cardiologist, Dr. Cristopher Peru.  Dysphagia: Patient evaluated by speech therapy with subsequent barium swallow demonstrating oral phase and oropharyngeal dysplasia.  Patient was started on Dysphagia 2 diet with nectar thick liquids.    Severe Protein-Calorie Malnutrition: Patient has experience 11% body mass weight loss over last year.  Nutrition  consult concerned for protein-calorie malnutrition.  Patient's diet was supplemented with Ensure TID and Magic Cups TID.   Discharge Vitals:   BP 97/65 mmHg  Pulse 72  Temp(Src) 97.8 F (36.6 C) (Oral)  Resp 18  Ht '5\' 7"'$  (1.702 m)  Wt 130 lb 8.2 oz (59.2 kg)  BMI 20.44 kg/m2  SpO2 100%  Discharge Labs:  No results found for this or any previous visit (from the past 24 hour(s)).  Signed: Iline Oven, MD 01/21/2015, 2:37 PM    Services Ordered on Discharge: [place] and Nurse Equipment Ordered on Discharge: none

## 2015-01-16 NOTE — Progress Notes (Signed)
Patient ID: Nuchem A Dehne, male   DOB: [birth year], 81 y.o.   MRN: [id number]    Patient Name: George Acosta Date of Encounter: 01/16/2015     Principal Problem:   Left lower lobe pneumonia Active Problems:   GERD   Other dysphagia   Ischemic cardiomyopathy   Paroxysmal atrial fibrillation [facility])   Defibrillator discharge   Lung mass RUL 1.1x 1.2 cm    COPD with emphysema ([place])   Protein-calorie malnutrition ([place])    SUBJECTIVE  No chest pain. Eating well. Denies fever or chills or nausea from amiodarone.   CURRENT MEDS . amiodarone  200 mg Oral BID  . amoxicillin-clavulanate  1 tablet Oral BID  . doxycycline  100 mg Oral BID  . enoxaparin (LOVENOX) injection  40 mg Subcutaneous Q24H  . LORazepam  0.25 mg Oral BID  . multivitamin with minerals  1 tablet Oral Daily  . pantoprazole  40 mg Oral Daily  . potassium chloride SA  20 mEq Oral Daily    OBJECTIVE  Filed Vitals:   01/15/15 1702 01/15/15 1725 01/15/15 2041 01/16/15 0506  BP: 99/53 109/69 97/50 101/52  Pulse: 74 87 86 70  Temp:  97.9 F (36.6 C) 98.3 F (36.8 C) 97.9 F (36.6 C)  TempSrc:  Oral Oral Oral  Resp: '16  18 18  '$ Height:      Weight:    129 lb 6.6 oz (58.7 kg)  SpO2: 95% 90% 97% 97%   No intake or output data in the 24 hours ending 01/16/15 0954 Filed Weights   01/15/15 0923 01/16/15 0506  Weight: 130 lb (58.968 kg) 129 lb 6.6 oz (58.7 kg)    PHYSICAL EXAM  General: Pleasant, NAD. Neuro: Alert and oriented X 3. Moves all extremities spontaneously. Psych: Normal affect. HEENT:  Normal  Neck: Supple without bruits or JVD. Lungs:  Resp regular and unlabored, CTA. Heart: RRR no s3, s4, or murmurs. Abdomen: Soft, non-tender, non-distended, BS + x 4.  Extremities: No clubbing, cyanosis or edema. DP/PT/Radials 2+ and equal bilaterally.  Accessory Clinical Findings  CBC  Recent Labs  01/15/15 0959  WBC 6.0  HGB 11.2*  HCT 32.9*  MCV 94.5  PLT 572   Basic Metabolic Panel  Recent  Labs  01/15/15 0959 01/15/15 1842 01/16/15 0457  NA 133*  --  134*  K 3.6  --  3.9  CL 97*  --  99*  CO2 28  --  27  GLUCOSE 112*  --  99  BUN 10  --  10  CREATININE 0.86  --  0.77  CALCIUM 8.6*  --  8.2*  MG  --  1.8  --    Liver Function Tests  Recent Labs  01/16/15 0457  AST 14*  ALT 8*  ALKPHOS 54  BILITOT 0.5  PROT 5.6*  ALBUMIN 2.7*   No results for input(s): LIPASE, AMYLASE in the last 72 hours. Cardiac Enzymes No results for input(s): CKTOTAL, CKMB, CKMBINDEX, TROPONINI in the last 72 hours. BNP Invalid input(s): POCBNP D-Dimer No results for input(s): DDIMER in the last 72 hours. Hemoglobin A1C No results for input(s): HGBA1C in the last 72 hours. Fasting Lipid Panel No results for input(s): CHOL, HDL, LDLCALC, TRIG, CHOLHDL, LDLDIRECT in the last 72 hours. Thyroid Function Tests No results for input(s): TSH, T4TOTAL, T3FREE, THYROIDAB in the last 72 hours.  Invalid input(s): FREET3  TELE  Atrial fib with a controlled VR.  Radiology/Studies  Dg Chest 2 View  01/15/2015   CLINICAL DATA:  81 year old male with dizziness, AICD discharge today. Productive cough. Initial encounter.  EXAM: CHEST  2 VIEW  COMPARISON:  01/13/2015 and earlier.  FINDINGS: Seated upright AP and lateral views of the chest. Stable left chest cardiac AICD. Stable cardiac size and mediastinal contours. No pneumothorax or pulmonary edema. Worsening confluence of opacity at the left lower lobe. Possible small pleural effusion. Ventilation elsewhere is stable. Nodular changes in the right apex again noted. Osteopenia. Lower thoracic compression fracture appears stable. Stable cholecystectomy clips.  IMPRESSION: 1. Increasing opacity at the left lung base suspicious for pneumonia but might reflect worsening atelectasis. Possible small left pleural effusion. 2. Otherwise stable chest   Electronically Signed   By: Genevie Ann M.D.   On: 01/15/2015 10:45   Dg Chest 2 View  01/13/2015   CLINICAL  DATA:  Defibrillator activation while lying in bed. History of CHF and myocardial infarction.  EXAM: CHEST  2 VIEW  COMPARISON:  Chest radiograph January 09, 2015 and CT chest December 12, 2014.  FINDINGS: Cardiomediastinal silhouette is unchanged. Mild bronchitic changes, improved aeration LEFT lung base with strandy densities. No pleural effusion or focal consolidative the patient. Small nodule projects in the LEFT lung base, more conspicuous than prior CT. Fibronodular scarring RIGHT upper lobe. LEFT cardiac defibrillator in situ. No pneumothorax. Surgical clip projects in RIGHT lung apex. Patient is osteopenic. Surgical clips in the included right abdomen compatible with cholecystectomy.  IMPRESSION: No active cardiopulmonary disease.  LEFT lung base atelectasis/scarring in a background of bronchitic changes/COPD.  Increasing conspicuity LEFT lower lobe nodule. Please see CT chest December 12, 2014.   Electronically Signed   By: Elon Alas M.D.   On: 01/13/2015 04:31    ASSESSMENT AND PLAN  1. ICD shock due to atrial tachy 2. Atrial fib now on amio 3. Lung CA 4. Possible pneumonia based on abnormal CXR Rec: from an EP/Card perspective, he may be discharged on amio 200 bid and stop sotalol. I will see him back in 2-4 weeks. Would anticipated reducing dose of amio at that time if maintaining rhythm. He is not an anti-coagulation candidate.  Gregg Taylor,M.D.  01/16/2015 9:54 AM

## 2015-01-16 NOTE — Progress Notes (Signed)
Subjective: George Acosta. Patient reports cough improved overnight.  He denies fever and chills overnight.  He is slightly concerned about his weight loss and constantly feeling cold.  Notified by RN in the afternoon that the patient was ambulating with PT when she was called by Telemetry that patient was in arrhythmia (SVT vs VT).  Patient then became pale and lightheaded.  Electrophysiology was called and patient was paced out of rhythm, started on Amiodarone drip, and given IV Metoprolol.  Rhythm was classified as an SVT.  Upon evaluation, patient sleeping.  He denies chest pain or SOB now or associated with previous episode.  He has no complaints at this time.  Objective: Vital signs in last 24 hours: Filed Vitals:   01/16/15 1125 01/16/15 1135 01/16/15 1136 01/16/15 1147  BP: 86/65  117/82 104/71  Pulse:  198    Temp:      TempSrc:      Resp:    19  Height:      Weight:      SpO2:  84%     Weight change:  No intake or output data in the 24 hours ending 01/16/15 1337 Physical Exam  Constitutional: He is oriented to person, place, and time.  Frail, cachectic, chronically ill appearing male sitting on side of bed in NAD  HENT:  Head: Normocephalic and atraumatic.  Eyes: EOM are normal.  Neck: No tracheal deviation present.  Cardiovascular: Normal rate, regular rhythm and normal heart sounds.   Pulmonary/Chest: Effort normal.  Diffuse rhonchorous breath sounds.  Decreased breath sounds in left base, though improved from yesterday.  Abdominal: Soft. He exhibits no distension. There is no tenderness. There is no rebound and no guarding.  Musculoskeletal: He exhibits no edema.  Neurological: He is alert and oriented to person, place, and time.  Skin: Skin is warm and dry. No rash noted.    Lab Results: Basic Metabolic Panel:  Recent Labs Lab 01/15/15 0959 01/15/15 1842 01/16/15 0457  NA 133*  --  134*  K 3.6  --  3.9  CL 97*  --  99*  CO2 28  --  27  GLUCOSE 112*  --  99    BUN 10  --  10  CREATININE 0.86  --  0.77  CALCIUM 8.6*  --  8.2*  MG  --  1.8  --    Liver Function Tests:  Recent Labs Lab 01/16/15 0457  AST 14*  ALT 8*  ALKPHOS 54  BILITOT 0.5  PROT 5.6*  ALBUMIN 2.7*   No results for input(s): LIPASE, AMYLASE in the last 168 hours. No results for input(s): AMMONIA in the last 168 hours. CBC:  Recent Labs Lab 01/13/15 0339 01/15/15 0959  WBC 5.9 6.0  HGB 11.1* 11.2*  HCT 33.7* 32.9*  MCV 96.0 94.5  PLT 224 292   Cardiac Enzymes: No results for input(s): CKTOTAL, CKMB, CKMBINDEX, TROPONINI in the last 168 hours. BNP: No results for input(s): PROBNP in the last 168 hours. D-Dimer: No results for input(s): DDIMER in the last 168 hours. CBG: No results for input(s): GLUCAP in the last 168 hours. Hemoglobin A1C: No results for input(s): HGBA1C in the last 168 hours. Fasting Lipid Panel: No results for input(s): CHOL, HDL, LDLCALC, TRIG, CHOLHDL, LDLDIRECT in the last 168 hours. Thyroid Function Tests: No results for input(s): TSH, T4TOTAL, FREET4, T3FREE, THYROIDAB in the last 168 hours. Coagulation:  Recent Labs Lab 01/13/15 0339  LABPROT 15.5*  INR 1.22   Anemia  Panel: No results for input(s): VITAMINB12, FOLATE, FERRITIN, TIBC, IRON, RETICCTPCT in the last 168 hours. Urine Drug Screen: Drugs of Abuse  No results found for: LABOPIA, COCAINSCRNUR, LABBENZ, AMPHETMU, THCU, LABBARB  Alcohol Level: No results for input(s): ETH in the last 168 hours. Urinalysis: No results for input(s): COLORURINE, LABSPEC, PHURINE, GLUCOSEU, HGBUR, BILIRUBINUR, KETONESUR, PROTEINUR, UROBILINOGEN, NITRITE, LEUKOCYTESUR in the last 168 hours.  Invalid input(s): APPERANCEUR Misc. Labs:   Micro Results: Recent Results (from the past 240 hour(s))  Culture, blood (routine x 2)     Status: None (Preliminary result)   Collection Time: 01/15/15  4:29 PM  Result Value Ref Range Status   Specimen Description BLOOD RIGHT ARM  Final    Special Requests BOTTLES DRAWN AEROBIC AND ANAEROBIC 5CC  Final   Culture NO GROWTH < 24 HOURS  Final   Report Status PENDING  Incomplete  Culture, blood (routine x 2)     Status: None (Preliminary result)   Collection Time: 01/15/15  4:38 PM  Result Value Ref Range Status   Specimen Description BLOOD LEFT HAND  Final   Special Requests BOTTLES DRAWN AEROBIC AND ANAEROBIC 3CC  Final   Culture NO GROWTH < 24 HOURS  Final   Report Status PENDING  Incomplete  MRSA PCR Screening     Status: Abnormal   Collection Time: 01/16/15  8:21 AM  Result Value Ref Range Status   MRSA by PCR POSITIVE (A) NEGATIVE Final    Comment:        The GeneXpert MRSA Assay (FDA approved for NASAL specimens only), is one component of a comprehensive MRSA colonization surveillance program. It is not intended to diagnose MRSA infection nor to guide or monitor treatment for MRSA infections. RESULT CALLED TO, READ BACK BY AND VERIFIED WITH: J. Prince Solian RN 11:00 01/16/15 ([email or web address])    Studies/Results: Dg Chest 2 View  01/15/2015   CLINICAL DATA:  81 year old male with dizziness, AICD discharge today. Productive cough. Initial encounter.  EXAM: CHEST  2 VIEW  COMPARISON:  01/13/2015 and earlier.  FINDINGS: Seated upright AP and lateral views of the chest. Stable left chest cardiac AICD. Stable cardiac size and mediastinal contours. No pneumothorax or pulmonary edema. Worsening confluence of opacity at the left lower lobe. Possible small pleural effusion. Ventilation elsewhere is stable. Nodular changes in the right apex again noted. Osteopenia. Lower thoracic compression fracture appears stable. Stable cholecystectomy clips.  IMPRESSION: 1. Increasing opacity at the left lung base suspicious for pneumonia but might reflect worsening atelectasis. Possible small left pleural effusion. 2. Otherwise stable chest   Electronically Signed   By: Genevie Ann M.D.   On: 01/15/2015 10:45   Dg Swallowing Func-speech Pathology  01/16/2015     Objective Swallowing Evaluation:    Patient Details  Name: Brodin Gelpi Blacketer MRN: [id number] Date of Birth: 11-22-1928  Today's Date: 01/16/2015 Time: SLP Start Time (ACUTE ONLY): 1030-SLP Stop Time (ACUTE ONLY): 1100 SLP Time Calculation (min) (ACUTE ONLY): 30 min  Past Medical History:  Past Medical History  Diagnosis Date  . Coronary atherosclerosis of unspecified type of vessel, native or graft    . Unspecified essential hypertension   . Benign prostatic hypertrophy   . Peptic ulcer disease   . Diverticulosis   . GERD (gastroesophageal reflux disease)   . Ischemic cardiomyopathy   . Congestive heart failure, unspecified   . Arrhythmia     paroxysmal afib  . Ventricular tachycardia (HCC)     A.   monomorphic VT  B. 04/2011 s/p generator change - [employer] ICD  [phone]  . MI (myocardial infarction) ([place]) 1985    "big MI after I got to hospital"  . Acute myocardial infarction, unspecified site, episode of care  unspecified 1985    "on way to hospital"  . Pneumonia 1980's  . Anxiety   . Pancreatitis 1980's  . Paroxysmal atrial fibrillation (HCC)   . AICD (automatic cardioverter/defibrillator) present     [employer]  . Presence of permanent cardiac pacemaker   . COPD (chronic obstructive pulmonary disease) ([place])   . SOB (shortness of breath)     not recently  . Arthritis   . Lung mass   . Radiation 2/2,2/4,2/8,2/10,05/24/14    Right upper lobe 60 Gy in 5 fractions   Past Surgical History:  Past Surgical History  Procedure Laterality Date  . Icd  2001; 2007; 04/28/11    generator change; complete replacement; lead replacement  . Laparotomy  11/2005    for small bowel obstruction,  . Cardiac catheterization  11/23/2005  . Cardiac defibrillator placement  1996  . Cholecystectomy  1980's  . Cataract extraction  2012    left eye  . Video bronchoscopy with endobronchial navigation Right 02/27/2014    Procedure: VIDEO BRONCHOSCOPY WITH ENDOBRONCHIAL NAVIGATION,insertion  Feducial marker right upper lobe  lung;  Surgeon: Collene Gobble, MD;   Location: [facility];  Service: Thoracic;  Laterality: Right;  . Implantable cardioverter defibrillator (icd) generator change N/A  04/28/2011    Procedure: ICD GENERATOR CHANGE;  Surgeon: Evans Lance, MD;   Location: [facility] CATH LAB;  Service: Cardiovascular;  Laterality: N/A;  . Lead revision N/A 04/28/2011    Procedure: LEAD REVISION;  Surgeon: Evans Lance, MD;  Location: [facility]  CATH LAB;  Service: Cardiovascular;  Laterality: N/A;   HPI:  Other Pertinent Information: Mr. Polich is an 81 year old with coronary  artery disease, chronic systolic heart failure and arrhythmias. He has  been implantable defibrillator. He recently completed radiation therapy  for right upper lobe lung cancer. He has had a poor appetite and some  difficulty swallowing pills and solid food. He's had a 30 pound  unintentional weight loss recently. He has had some dizziness with  repeated shocks.  He mentioned that he had developed an increased  productive cough over the past week. He has had some shivering but no  documented fever. He denies any shortness of breath. His cough is  productive of yellow-green sputum with a sour taste. His chest x-ray shows  a new left lower lobe infiltrate.   No Data Recorded  Assessment / Plan / Recommendation CHL IP CLINICAL IMPRESSIONS 01/16/2015  Therapy Diagnosis Mild oral phase dysphagia;Moderate pharyngeal phase  dysphagia  Clinical Impression Pt demosntrates a moderate dysphagia with slow but  adequate oral function due to missing dentition. Oropharyngeal phase is  impaired and characterized by weakness across the hyolaryngeal mechanism  resulting in penetration of thin liquids during the swallow and  penetration of nectar and thin after the swallow due to residuals.  Sensation is intermittent, but pt is able to clear most penetrate with  cues for throat clearing. Bolus transit and airway protection is much  improved with nectar thick liquids and soft solids when pt  completes a  chin tuck and swallows twice. Pt continues to report globus when residuals  cleared, likely due to appearance of stasis with with distal esopahgus (no  radiologist present to confirm). Pt is resistant to teaching  and is  unlikely to follow recommendations after d/c home. Requested f/u with home  health SLP to help pt and family implement diet recommendations and  strategies if possible.       CHL IP TREATMENT RECOMMENDATION 01/16/2015  Treatment Recommendations Therapy as outlined in treatment plan below     CHL IP DIET RECOMMENDATION 01/16/2015  SLP Diet Recommendations Dysphagia 2 (Fine chop);Nectar  Liquid Administration via (None)  Medication Administration Crushed with puree  Compensations Chin tuck;Slow rate;Small sips/bites;Multiple dry swallows  after each bite/sip;Clear throat intermittently  Postural Changes and/or Swallow Maneuvers (None)     CHL IP OTHER RECOMMENDATIONS 01/16/2015  Recommended Consults (None)  Oral Care Recommendations Oral care BID  Other Recommendations Order thickener from pharmacy     No flowsheet data found.   No flowsheet data found.   Pertinent Vitals/Pain NA    SLP Swallow Goals No flowsheet data found.  No flowsheet data found.    CHL IP REASON FOR REFERRAL 01/16/2015  Reason for Referral Objectively evaluate swallowing function     CHL IP ORAL PHASE 01/16/2015  Lips (None)  Tongue (None)  Mucous membranes (None)  Nutritional status (None)  Other (None)  Oxygen therapy (None)  Oral Phase WFL  Oral - Pudding Teaspoon (None)  Oral - Pudding Cup (None)  Oral - Honey Teaspoon (None)  Oral - Honey Cup (None)  Oral - Honey Syringe (None)  Oral - Nectar Teaspoon (None)  Oral - Nectar Cup (None)  Oral - Nectar Straw (None)  Oral - Nectar Syringe (None)  Oral - Ice Chips (None)  Oral - Thin Teaspoon (None)  Oral - Thin Cup (None)  Oral - Thin Straw (None)  Oral - Thin Syringe (None)  Oral - Puree (None)  Oral - Mechanical Soft (None)  Oral - Regular (None)  Oral -  Multi-consistency (None)  Oral - Pill (None)  Oral Phase - Comment (None)      CHL IP PHARYNGEAL PHASE 01/16/2015  Pharyngeal Phase Impaired  Pharyngeal - Pudding Teaspoon (None)  Penetration/Aspiration details (pudding teaspoon) (None)  Pharyngeal - Pudding Cup (None)  Penetration/Aspiration details (pudding cup) (None)  Pharyngeal - Honey Teaspoon (None)  Penetration/Aspiration details (honey teaspoon) (None)  Pharyngeal - Honey Cup (None)  Penetration/Aspiration details (honey cup) (None)  Pharyngeal - Honey Syringe (None)  Penetration/Aspiration details (honey syringe) (None)  Pharyngeal - Nectar Teaspoon (None)  Penetration/Aspiration details (nectar teaspoon) (None)  Pharyngeal - Nectar Cup (None)  Penetration/Aspiration details (nectar cup) (None)  Pharyngeal - Nectar Straw (None)  Penetration/Aspiration details (nectar straw) (None)  Pharyngeal - Nectar Syringe (None)  Penetration/Aspiration details (nectar syringe) (None)  Pharyngeal - Ice Chips (None)  Penetration/Aspiration details (ice chips) (None)  Pharyngeal - Thin Teaspoon (None)  Penetration/Aspiration details (thin teaspoon) (None)  Pharyngeal - Thin Cup (None)  Penetration/Aspiration details (thin cup) (None)  Pharyngeal - Thin Straw (None)  Penetration/Aspiration details (thin straw) (None)  Pharyngeal - Thin Syringe (None)  Penetration/Aspiration details (thin syringe') (None)  Pharyngeal - Puree (None)  Penetration/Aspiration details (puree) (None)  Pharyngeal - Mechanical Soft (None)  Penetration/Aspiration details (mechanical soft) (None)  Pharyngeal - Regular (None)  Penetration/Aspiration details (regular) (None)  Pharyngeal - Multi-consistency (None)  Penetration/Aspiration details (multi-consistency) (None)  Pharyngeal - Pill (None)  Penetration/Aspiration details (pill) (None)  Pharyngeal Comment (None)      No flowsheet data found.  CHL IP GO 01/16/2015  Functional Assessment Tool Used clinical judgement  Functional Limitations Swallowing   Swallow Current Status (C3762) CK  Swallow Goal Status [phone]) [place]  Swallow Discharge Status [phone]) (None)  Motor Speech Current Status [phone]) (None)  Motor Speech Goal Status [phone]) (None)  Motor Speech Goal Status [phone]) (None)  Spoken Language Comprehension Current Status [phone]) (None)  Spoken Language Comprehension Goal Status (K7425) (None)  Spoken Language Comprehension Discharge Status [phone]) (None)  Spoken Language Expression Current Status [phone]) (None)  Spoken Language Expression Goal Status [phone]) (None)  Spoken Language Expression Discharge Status [phone]) (None)  Attention Current Status (S0630) (None)  Attention Goal Status (Z6010) (None)  Attention Discharge Status [phone]) (None)  Memory Current Status (T7322) (None)  Memory Goal Status (G2542) (None)  Memory Discharge Status (H0623) (None)  Voice Current Status (J6283) (None)  Voice Goal Status (T5176) (None)  Voice Discharge Status (H6073) (None)  Other Speech-Language Pathology Functional Limitation [phone]) (None)  Other Speech-Language Pathology Functional Limitation Goal Status (I9485)  (None)  Other Speech-Language Pathology Functional Limitation Discharge Status  [phone]) (None)          Herbie Baltimore, MA CCC-SLP [phone]  [place], Katherene Ponto 01/16/2015, 12:24 PM    Medications: I have reviewed the patient's current medications. Scheduled Meds: . amiodarone  200 mg Oral BID  . amoxicillin-clavulanate  1 tablet Oral BID  . [START ON 01/17/2015] Chlorhexidine Gluconate Cloth  6 each Topical Q0600  . doxycycline  100 mg Oral BID  . enoxaparin (LOVENOX) injection  40 mg Subcutaneous Q24H  . feeding supplement (ENSURE ENLIVE)  237 mL Oral TID BM  . LORazepam  0.25 mg Oral BID  . multivitamin with minerals  1 tablet Oral Daily  . mupirocin ointment  1 application Nasal BID  . pantoprazole sodium  40 mg Oral Daily  . potassium chloride  20 mEq Oral Daily   Continuous Infusions: . amiodarone 60 mg/hr (01/16/15 1150)   Followed by  . amiodarone     PRN Meds:.acetaminophen, diclofenac sodium, RESOURCE THICKENUP CLEAR, senna-docusate Assessment/Plan: Principal Problem:   Left lower lobe pneumonia Active Problems:   GERD   Other dysphagia   Ischemic cardiomyopathy   Paroxysmal atrial fibrillation (HCC)   Defibrillator discharge   Lung mass RUL 1.1x 1.2 cm    COPD with emphysema ([place])   Protein-calorie malnutrition ([place])  Mr. Harter is an 81 yo male with CAD, ICM s/p ICD placement, COPD, NSCLC s/p radiation, and h/o left femoral fracture, with LLL CAP.  LLL CAP: Patient with 1 week increase in yellow/green sputum. Interval increase in LLL opacity over last two days. Patient s/p CTX and doxycycline in ED. CURB65 is 1. Lack of SIRS criteria likely 2/2 patient being elderly and immunosuppressed by lung cancer and irradiation. Will continue IV antibiotics overnight with switch to oral agent tomorrow. - Doxycycline 100 mg BID for 4 days - Augmentin 875 BID for 4 days. - Blood culture NG'@d1'$  '[ ]'$  Sputum culture  Afib/SVT: Previously rate controlled on Sotalol 160 mg BID, but hypotensive. Cardiology evaluation in ED changed regimen to Amiodarone. Patient now on Amiodarone drip and IV Metop for management of rate.   Patient had recent fall in March; therefore, despite CHADsVASc of 5, patient likely too unsafe to anticoagulate. - Amiodarone drip, per EP - Metoprolol 2.5 mg IV q6hours, per EP  ICM s/p ICD placement: Patient received 5 inappropriate shocks for misinterpretation of SVT. Cardiology reprogrammed ICD to only shock for ventricular rates > 200 bpm. - Amio drip  Dysphagia: Dysphagia to some pills and hard solid foods.  - Barium swallow shows mild oral phase dysphagia  and moderate pharyngeal phase dysphagia  Malnutrition: Chronically ill appearing male with decreased appetite, weight loss, low body fat. - Ensure TID and Magic Cup TID - PT/OT  FEN/GI: - Dysphagia 2, Nectar thick -  Protonix  DVT Ppx: Lovenox  Dispo: Disposition is deferred at this time, awaiting improvement of current medical problems.  Anticipated discharge in approximately 1 day(s).   The patient does have a current PCP Janie Morning, DO) and does need an [facility] hospital follow-up appointment after discharge.  The patient does not have transportation limitations that hinder transportation to clinic appointments.  .Services Needed at time of discharge: Y = Yes, Blank = No PT:   OT:   RN:   Equipment:  bedside commode   Other:       Iline Oven, MD 01/16/2015, 1:37 PM

## 2015-01-16 NOTE — Progress Notes (Signed)
   01/16/15 0900  SLP G-Codes **NOT FOR INPATIENT CLASS**  Functional Assessment Tool Used clinical judgement  Functional Limitations Swallowing  Swallow Current Status (I3539) CK  Swallow Goal Status (N2258) CJ  SLP Evaluations  $ SLP Speech Visit 1 Procedure  SLP Evaluations  $BSS Swallow 1 Procedure

## 2015-01-17 DIAGNOSIS — I255 Ischemic cardiomyopathy: Secondary | ICD-10-CM | POA: Diagnosis present

## 2015-01-17 DIAGNOSIS — F419 Anxiety disorder, unspecified: Secondary | ICD-10-CM | POA: Diagnosis not present

## 2015-01-17 DIAGNOSIS — R0600 Dyspnea, unspecified: Secondary | ICD-10-CM | POA: Diagnosis not present

## 2015-01-17 DIAGNOSIS — Z87891 Personal history of nicotine dependence: Secondary | ICD-10-CM | POA: Diagnosis not present

## 2015-01-17 DIAGNOSIS — I471 Supraventricular tachycardia: Secondary | ICD-10-CM | POA: Diagnosis present

## 2015-01-17 DIAGNOSIS — J440 Chronic obstructive pulmonary disease with acute lower respiratory infection: Secondary | ICD-10-CM | POA: Diagnosis present

## 2015-01-17 DIAGNOSIS — I959 Hypotension, unspecified: Secondary | ICD-10-CM | POA: Diagnosis present

## 2015-01-17 DIAGNOSIS — R1312 Dysphagia, oropharyngeal phase: Secondary | ICD-10-CM | POA: Diagnosis present

## 2015-01-17 DIAGNOSIS — Z95810 Presence of automatic (implantable) cardiac defibrillator: Secondary | ICD-10-CM | POA: Diagnosis not present

## 2015-01-17 DIAGNOSIS — E871 Hypo-osmolality and hyponatremia: Secondary | ICD-10-CM | POA: Diagnosis present

## 2015-01-17 DIAGNOSIS — Z955 Presence of coronary angioplasty implant and graft: Secondary | ICD-10-CM | POA: Diagnosis not present

## 2015-01-17 DIAGNOSIS — I480 Paroxysmal atrial fibrillation: Secondary | ICD-10-CM | POA: Diagnosis not present

## 2015-01-17 DIAGNOSIS — Z515 Encounter for palliative care: Secondary | ICD-10-CM | POA: Diagnosis not present

## 2015-01-17 DIAGNOSIS — I10 Essential (primary) hypertension: Secondary | ICD-10-CM | POA: Diagnosis present

## 2015-01-17 DIAGNOSIS — C3411 Malignant neoplasm of upper lobe, right bronchus or lung: Secondary | ICD-10-CM | POA: Diagnosis present

## 2015-01-17 DIAGNOSIS — R410 Disorientation, unspecified: Secondary | ICD-10-CM | POA: Diagnosis not present

## 2015-01-17 DIAGNOSIS — J449 Chronic obstructive pulmonary disease, unspecified: Secondary | ICD-10-CM | POA: Diagnosis present

## 2015-01-17 DIAGNOSIS — I2510 Atherosclerotic heart disease of native coronary artery without angina pectoris: Secondary | ICD-10-CM | POA: Diagnosis present

## 2015-01-17 DIAGNOSIS — Z79899 Other long term (current) drug therapy: Secondary | ICD-10-CM | POA: Diagnosis not present

## 2015-01-17 DIAGNOSIS — I252 Old myocardial infarction: Secondary | ICD-10-CM | POA: Diagnosis not present

## 2015-01-17 DIAGNOSIS — Z923 Personal history of irradiation: Secondary | ICD-10-CM | POA: Diagnosis not present

## 2015-01-17 DIAGNOSIS — E43 Unspecified severe protein-calorie malnutrition: Secondary | ICD-10-CM | POA: Diagnosis present

## 2015-01-17 DIAGNOSIS — R05 Cough: Secondary | ICD-10-CM | POA: Diagnosis present

## 2015-01-17 DIAGNOSIS — J189 Pneumonia, unspecified organism: Secondary | ICD-10-CM | POA: Diagnosis present

## 2015-01-17 DIAGNOSIS — Z6820 Body mass index (BMI) 20.0-20.9, adult: Secondary | ICD-10-CM | POA: Diagnosis not present

## 2015-01-17 DIAGNOSIS — I4892 Unspecified atrial flutter: Secondary | ICD-10-CM | POA: Diagnosis present

## 2015-01-17 DIAGNOSIS — K219 Gastro-esophageal reflux disease without esophagitis: Secondary | ICD-10-CM | POA: Diagnosis present

## 2015-01-17 DIAGNOSIS — I4891 Unspecified atrial fibrillation: Secondary | ICD-10-CM | POA: Diagnosis not present

## 2015-01-17 DIAGNOSIS — Z7189 Other specified counseling: Secondary | ICD-10-CM | POA: Diagnosis not present

## 2015-01-17 LAB — BASIC METABOLIC PANEL
ANION GAP: 7 (ref 5–15)
BUN: 12 mg/dL (ref 6–20)
CHLORIDE: 99 mmol/L — AB (ref 101–111)
CO2: 28 mmol/L (ref 22–32)
Calcium: 8.2 mg/dL — ABNORMAL LOW (ref 8.9–10.3)
Creatinine, Ser: 0.75 mg/dL (ref 0.61–1.24)
GFR calc non Af Amer: >60 mL/min (ref >60)
Glucose, Bld: 106 mg/dL — ABNORMAL HIGH (ref 65–99)
POTASSIUM: 3.5 mmol/L (ref 3.5–5.1)
SODIUM: 134 mmol/L — AB (ref 135–145)

## 2015-01-17 LAB — MAGNESIUM: MAGNESIUM: 1.8 mg/dL (ref 1.7–2.4)

## 2015-01-17 MED ORDER — POTASSIUM CHLORIDE CRYS ER 20 MEQ PO TBCR
40.0000 meq | EXTENDED_RELEASE_TABLET | Freq: Once | ORAL | Status: AC
  Administered 2015-01-17: 40 meq via ORAL
  Filled 2015-01-17: qty 2

## 2015-01-17 NOTE — Progress Notes (Signed)
Pt has moments of confusion as night progresses. Has pulled leads off repeatedly and almost pulled left Forearm IV with amiodarone running in it out. Placed in mittens for protection. Charge nurse notified

## 2015-01-17 NOTE — Progress Notes (Signed)
Paged Teaching Service on-call, informed of confusion and placement of mittens. No orders received

## 2015-01-17 NOTE — Progress Notes (Signed)
Patient ID: George Acosta, male   DOB: 10-Mar-1929, 81 y.o.   MRN: [id number]  Date: 01/17/2015  Patient name: George Acosta  Medical record number: [id number]  Date of birth: [birth year]   This patient's plan of care was discussed with the house staff. Please see their note for complete details. I concur with their findings. [name] developed recurrent SVT yesterday and was paced into atrial fibrillation. He was converted to IV amiodarone and his rate has been controlled overnight. He appears to be improving on empiric antibiotic therapy for community-acquired pneumonia. We will continue IV amiodarone overnight. Dr. Lovena Le has brought up the subject of deactivation of his defibrillator and transitioning to palliative care. We will discuss this with [name] again tomorrow and consider a palliative care consult for further discussion of goals of care.   Michel Bickers, MD 01/17/2015, 2:57 PM

## 2015-01-17 NOTE — Progress Notes (Signed)
Subjective: NAEON. Patient reports improved cough and denies fever or chills. He is oriented to person and time.  He remained on Amiodarone drip overnight with BPs stable but borderline low.     Objective: Vital signs in last 24 hours: Filed Vitals:   01/16/15 1516 01/16/15 1942 01/16/15 2348 01/17/15 0450  BP: '94/58 87/42 96/51 '$ 93/50  Pulse: 71 74 71 73  Temp: 97.6 F (36.4 C) 97.9 F (36.6 C) 97.6 F (36.4 C) 97.7 F (36.5 C)  TempSrc: Oral Oral Oral Oral  Resp: '14 17 16 19  '$ Height:      Weight:    129 lb (58.514 kg)  SpO2: 91% 95% 99% 98%   Weight change: -1 lb (-0.454 kg)  Intake/Output Summary (Last 24 hours) at 01/17/15 1133 Last data filed at 01/16/15 2100  Gross per 24 hour  Intake    237 ml  Output    100 ml  Net    137 ml   Physical Exam  Constitutional: He is oriented to person, place, and time.  Frail, cachectic, chronically ill appearing male sitting on side of bed in NAD  HENT:  Head: Normocephalic and atraumatic.  Eyes: EOM are normal.  Neck: No tracheal deviation present.  Cardiovascular: Normal rate, regular rhythm and normal heart sounds.   Pulmonary/Chest: Effort normal.  Diffuse rhonchorous breath sounds.  Decreased breath sounds in left base, stable compared to yesterday.  Abdominal: Soft. He exhibits no distension. There is no tenderness. There is no rebound and no guarding.  Musculoskeletal: He exhibits no edema.  Neurological: He is alert and oriented to person, place, and time.  Skin: Skin is warm and dry. No rash noted.    Lab Results: Basic Metabolic Panel:  Recent Labs Lab 01/15/15 1842 01/16/15 0457 01/17/15 0328 01/17/15 0930  NA  --  134* 134*  --   K  --  3.9 3.5  --   CL  --  99* 99*  --   CO2  --  27 28  --   GLUCOSE  --  99 106*  --   BUN  --  10 12  --   CREATININE  --  0.77 0.75  --   CALCIUM  --  8.2* 8.2*  --   MG 1.8  --   --  1.8   Liver Function Tests:  Recent Labs Lab 01/16/15 0457  AST 14*  ALT 8*    ALKPHOS 54  BILITOT 0.5  PROT 5.6*  ALBUMIN 2.7*   No results for input(s): LIPASE, AMYLASE in the last 168 hours. No results for input(s): AMMONIA in the last 168 hours. CBC:  Recent Labs Lab 01/13/15 0339 01/15/15 0959  WBC 5.9 6.0  HGB 11.1* 11.2*  HCT 33.7* 32.9*  MCV 96.0 94.5  PLT 224 292   Cardiac Enzymes: No results for input(s): CKTOTAL, CKMB, CKMBINDEX, TROPONINI in the last 168 hours. BNP: No results for input(s): PROBNP in the last 168 hours. D-Dimer: No results for input(s): DDIMER in the last 168 hours. CBG:  Recent Labs Lab 01/16/15 2120  GLUCAP 115*   Hemoglobin A1C: No results for input(s): HGBA1C in the last 168 hours. Fasting Lipid Panel: No results for input(s): CHOL, HDL, LDLCALC, TRIG, CHOLHDL, LDLDIRECT in the last 168 hours. Thyroid Function Tests: No results for input(s): TSH, T4TOTAL, FREET4, T3FREE, THYROIDAB in the last 168 hours. Coagulation:  Recent Labs Lab 01/13/15 0339  LABPROT 15.5*  INR 1.22   Anemia Panel: No results  for input(s): VITAMINB12, FOLATE, FERRITIN, TIBC, IRON, RETICCTPCT in the last 168 hours. Urine Drug Screen: Drugs of Abuse  No results found for: LABOPIA, COCAINSCRNUR, LABBENZ, AMPHETMU, THCU, LABBARB  Alcohol Level: No results for input(s): ETH in the last 168 hours. Urinalysis: No results for input(s): COLORURINE, LABSPEC, PHURINE, GLUCOSEU, HGBUR, BILIRUBINUR, KETONESUR, PROTEINUR, UROBILINOGEN, NITRITE, LEUKOCYTESUR in the last 168 hours.  Invalid input(s): APPERANCEUR Misc. Labs:   Micro Results: Recent Results (from the past 240 hour(s))  Culture, blood (routine x 2)     Status: None (Preliminary result)   Collection Time: 01/15/15  4:29 PM  Result Value Ref Range Status   Specimen Description BLOOD RIGHT ARM  Final   Special Requests BOTTLES DRAWN AEROBIC AND ANAEROBIC 5CC  Final   Culture NO GROWTH 2 DAYS  Final   Report Status PENDING  Incomplete  Culture, blood (routine x 2)     Status:  None (Preliminary result)   Collection Time: 01/15/15  4:38 PM  Result Value Ref Range Status   Specimen Description BLOOD LEFT HAND  Final   Special Requests BOTTLES DRAWN AEROBIC AND ANAEROBIC 3CC  Final   Culture NO GROWTH 2 DAYS  Final   Report Status PENDING  Incomplete  MRSA PCR Screening     Status: Abnormal   Collection Time: 01/16/15  8:21 AM  Result Value Ref Range Status   MRSA by PCR POSITIVE (A) NEGATIVE Final    Comment:        The GeneXpert MRSA Assay (FDA approved for NASAL specimens only), is one component of a comprehensive MRSA colonization surveillance program. It is not intended to diagnose MRSA infection nor to guide or monitor treatment for MRSA infections. RESULT CALLED TO, READ BACK BY AND VERIFIED WITH: J. Prince Solian RN 11:00 01/16/15 ([email or web address])    Studies/Results: Dg Swallowing Func-speech Pathology  01/16/2015    Objective Swallowing Evaluation:    Patient Details  Name: Demani Mcbrien Hoback MRN: [id number] Date of Birth: [birth year]  Today's Date: 01/16/2015 Time: SLP Start Time (ACUTE ONLY): 1030-SLP Stop Time (ACUTE ONLY): 1100 SLP Time Calculation (min) (ACUTE ONLY): 30 min  Past Medical History:  Past Medical History  Diagnosis Date  . Coronary atherosclerosis of unspecified type of vessel, native or graft    . Unspecified essential hypertension   . Benign prostatic hypertrophy   . Peptic ulcer disease   . Diverticulosis   . GERD (gastroesophageal reflux disease)   . Ischemic cardiomyopathy   . Congestive heart failure, unspecified   . Arrhythmia     paroxysmal afib  . Ventricular tachycardia ([place])     A.   monomorphic VT  B. 04/2011 s/p generator change - [employer] ICD  [phone]  . MI (myocardial infarction) ([place]) 1985    "big MI after I got to hospital"  . Acute myocardial infarction, unspecified site, episode of care  unspecified 1985    "on way to hospital"  . Pneumonia 1980's  . Anxiety   . Pancreatitis 1980's  . Paroxysmal atrial fibrillation (H[facility])   .  AICD (automatic cardioverter/defibrillator) present     [employer]  . Presence of permanent cardiac pacemaker   . COPD (chronic obstructive pulmonary disease) ([place])   . SOB (shortness of breath)     not recently  . Arthritis   . Lung mass   . Radiation 2/2,2/4,2/8,2/10,05/24/14    Right upper lobe 60 Gy in 5 fractions   Past Surgical History:  Past Surgical History  Procedure  Laterality Date  . Icd  2001; 2007; 04/28/11    generator change; complete replacement; lead replacement  . Laparotomy  11/2005    for small bowel obstruction,  . Cardiac catheterization  11/23/2005  . Cardiac defibrillator placement  1996  . Cholecystectomy  1980's  . Cataract extraction  2012    left eye  . Video bronchoscopy with endobronchial navigation Right 02/27/2014    Procedure: VIDEO BRONCHOSCOPY WITH ENDOBRONCHIAL NAVIGATION,insertion  Feducial marker right upper lobe lung;  Surgeon: Collene Gobble, MD;   Location: [place];  Service: Thoracic;  Laterality: Right;  . Implantable cardioverter defibrillator (icd) generator change N/A  04/28/2011    Procedure: ICD GENERATOR CHANGE;  Surgeon: Evans Lance, MD;   Location: [facility] CATH LAB;  Service: Cardiovascular;  Laterality: N/A;  . Lead revision N/A 04/28/2011    Procedure: LEAD REVISION;  Surgeon: Evans Lance, MD;  Location: [facility]  CATH LAB;  Service: Cardiovascular;  Laterality: N/A;   HPI:  Other Pertinent Information: Mr. Lykens is an 81 year old with coronary  artery disease, chronic systolic heart failure and arrhythmias. He has  been implantable defibrillator. He recently completed radiation therapy  for right upper lobe lung cancer. He has had a poor appetite and some  difficulty swallowing pills and solid food. He's had a 30 pound  unintentional weight loss recently. He has had some dizziness with  repeated shocks.  He mentioned that he had developed an increased  productive cough over the past week. He has had some shivering but no  documented fever. He denies any shortness of  breath. His cough is  productive of yellow-green sputum with a sour taste. His chest x-ray shows  a new left lower lobe infiltrate.   No Data Recorded  Assessment / Plan / Recommendation CHL IP CLINICAL IMPRESSIONS 01/16/2015  Therapy Diagnosis Mild oral phase dysphagia;Moderate pharyngeal phase  dysphagia  Clinical Impression Pt demosntrates a moderate dysphagia with slow but  adequate oral function due to missing dentition. Oropharyngeal phase is  impaired and characterized by weakness across the hyolaryngeal mechanism  resulting in penetration of thin liquids during the swallow and  penetration of nectar and thin after the swallow due to residuals.  Sensation is intermittent, but pt is able to clear most penetrate with  cues for throat clearing. Bolus transit and airway protection is much  improved with nectar thick liquids and soft solids when pt completes a  chin tuck and swallows twice. Pt continues to report globus when residuals  cleared, likely due to appearance of stasis with with distal esopahgus (no  radiologist present to confirm). Pt is resistant to teaching and is  unlikely to follow recommendations after d/c home. Requested f/u with home  health SLP to help pt and family implement diet recommendations and  strategies if possible.       CHL IP TREATMENT RECOMMENDATION 01/16/2015  Treatment Recommendations Therapy as outlined in treatment plan below     CHL IP DIET RECOMMENDATION 01/16/2015  SLP Diet Recommendations Dysphagia 2 (Fine chop);Nectar  Liquid Administration via (None)  Medication Administration Crushed with puree  Compensations Chin tuck;Slow rate;Small sips/bites;Multiple dry swallows  after each bite/sip;Clear throat intermittently  Postural Changes and/or Swallow Maneuvers (None)     CHL IP OTHER RECOMMENDATIONS 01/16/2015  Recommended Consults (None)  Oral Care Recommendations Oral care BID  Other Recommendations Order thickener from pharmacy     No flowsheet data found.   No flowsheet data  found.   Pertinent Vitals/Pain  NA    SLP Swallow Goals No flowsheet data found.  No flowsheet data found.    CHL IP REASON FOR REFERRAL 01/16/2015  Reason for Referral Objectively evaluate swallowing function     CHL IP ORAL PHASE 01/16/2015  Lips (None)  Tongue (None)  Mucous membranes (None)  Nutritional status (None)  Other (None)  Oxygen therapy (None)  Oral Phase WFL  Oral - Pudding Teaspoon (None)  Oral - Pudding Cup (None)  Oral - Honey Teaspoon (None)  Oral - Honey Cup (None)  Oral - Honey Syringe (None)  Oral - Nectar Teaspoon (None)  Oral - Nectar Cup (None)  Oral - Nectar Straw (None)  Oral - Nectar Syringe (None)  Oral - Ice Chips (None)  Oral - Thin Teaspoon (None)  Oral - Thin Cup (None)  Oral - Thin Straw (None)  Oral - Thin Syringe (None)  Oral - Puree (None)  Oral - Mechanical Soft (None)  Oral - Regular (None)  Oral - Multi-consistency (None)  Oral - Pill (None)  Oral Phase - Comment (None)      CHL IP PHARYNGEAL PHASE 01/16/2015  Pharyngeal Phase Impaired  Pharyngeal - Pudding Teaspoon (None)  Penetration/Aspiration details (pudding teaspoon) (None)  Pharyngeal - Pudding Cup (None)  Penetration/Aspiration details (pudding cup) (None)  Pharyngeal - Honey Teaspoon (None)  Penetration/Aspiration details (honey teaspoon) (None)  Pharyngeal - Honey Cup (None)  Penetration/Aspiration details (honey cup) (None)  Pharyngeal - Honey Syringe (None)  Penetration/Aspiration details (honey syringe) (None)  Pharyngeal - Nectar Teaspoon (None)  Penetration/Aspiration details (nectar teaspoon) (None)  Pharyngeal - Nectar Cup (None)  Penetration/Aspiration details (nectar cup) (None)  Pharyngeal - Nectar Straw (None)  Penetration/Aspiration details (nectar straw) (None)  Pharyngeal - Nectar Syringe (None)  Penetration/Aspiration details (nectar syringe) (None)  Pharyngeal - Ice Chips (None)  Penetration/Aspiration details (ice chips) (None)  Pharyngeal - Thin Teaspoon (None)  Penetration/Aspiration details (thin  teaspoon) (None)  Pharyngeal - Thin Cup (None)  Penetration/Aspiration details (thin cup) (None)  Pharyngeal - Thin Straw (None)  Penetration/Aspiration details (thin straw) (None)  Pharyngeal - Thin Syringe (None)  Penetration/Aspiration details (thin syringe') (None)  Pharyngeal - Puree (None)  Penetration/Aspiration details (puree) (None)  Pharyngeal - Mechanical Soft (None)  Penetration/Aspiration details (mechanical soft) (None)  Pharyngeal - Regular (None)  Penetration/Aspiration details (regular) (None)  Pharyngeal - Multi-consistency (None)  Penetration/Aspiration details (multi-consistency) (None)  Pharyngeal - Pill (None)  Penetration/Aspiration details (pill) (None)  Pharyngeal Comment (None)      No flowsheet data found.  CHL IP GO 01/16/2015  Functional Assessment Tool Used clinical judgement  Functional Limitations Swallowing  Swallow Current Status [phone]) CK  Swallow Goal Status (V6979) Aneth Discharge Status [phone]) (None)  Motor Speech Current Status [phone]) (None)  Motor Speech Goal Status [phone]) (None)  Motor Speech Goal Status (E6754) (None)  Spoken Language Comprehension Current Status (G9201) (None)  Spoken Language Comprehension Goal Status (E0712) (None)  Spoken Language Comprehension Discharge Status [phone]) (None)  Spoken Language Expression Current Status [phone]) (None)  Spoken Language Expression Goal Status [phone]) (None)  Spoken Language Expression Discharge Status [phone]) (None)  Attention Current Status (N4076) (None)  Attention Goal Status (K0881) (None)  Attention Discharge Status [phone]) (None)  Memory Current Status (X4585) (None)  Memory Goal Status (F2924) (None)  Memory Discharge Status (M6286) (None)  Voice Current Status (N8177) (None)  Voice Goal Status (N1657) (None)  Voice Discharge Status (X0383) (None)  Other Speech-Language Pathology Functional Limitation [phone]) (None)  Other Speech-Language Pathology  Functional Limitation Goal Status [phone])  (None)  Other  Speech-Language Pathology Functional Limitation Discharge Status  [phone]) (None)          Herbie Baltimore, MA CCC-SLP [phone]  DeBlois, Katherene Ponto 01/16/2015, 12:24 PM    Medications: I have reviewed the patient's current medications. Scheduled Meds: . amiodarone  200 mg Oral BID  . amoxicillin-clavulanate  1 tablet Oral BID  . Chlorhexidine Gluconate Cloth  6 each Topical Q0600  . doxycycline  100 mg Oral BID  . enoxaparin (LOVENOX) injection  40 mg Subcutaneous Q24H  . feeding supplement (ENSURE ENLIVE)  237 mL Oral TID BM  . LORazepam  0.25 mg Oral BID  . multivitamin with minerals  1 tablet Oral Daily  . mupirocin ointment  1 application Nasal BID  . pantoprazole sodium  40 mg Oral Daily  . potassium chloride  20 mEq Oral Daily   Continuous Infusions: . amiodarone 30 mg/hr (01/16/15 1808)   PRN Meds:.acetaminophen, diclofenac sodium, RESOURCE THICKENUP CLEAR, senna-docusate Assessment/Plan: Principal Problem:   Left lower lobe pneumonia Active Problems:   Paroxysmal atrial fibrillation (HCC)   Protein-calorie malnutrition, severe (HCC)   GERD   Dysphagia, oropharyngeal phase   Ischemic cardiomyopathy   Defibrillator discharge   Lung mass RUL 1.1x 1.2 cm    COPD with emphysema ([place])   Acute delirium  Mr. Yeary is an 81 yo male with CAD, ICM s/p ICD placement, COPD, NSCLC s/p radiation, and h/o left femoral fracture, with LLL CAP.  LLL CAP: Patient with 1 week increase in yellow/green sputum. Interval increase in LLL opacity over last two days. Patient s/p CTX and doxycycline in ED. CURB65 is 1. Lack of SIRS criteria likely 2/2 patient being elderly and immunosuppressed by lung cancer and irradiation. Will continue IV antibiotics overnight with switch to oral agent tomorrow. - Doxycycline 100 mg BID for 4 days (10/6-10/9) - Augmentin 875 BID for 4 days (10/6-10/9) - Blood culture NG'@d2'$  '[ ]'$  Sputum culture  Acute Delirium: Patient sundowning overnight, requiring  patients home PRN Ativan and mittens.  Nursing states patient confusion improved with sleep.  Patient reoriented in morning without complaint.  Some acute delirium unsurprising given patient's age, unfamiliar surroundings, acute illness, and multiple distractions at night.  Will attempt to maximize light during day and distraction at night.  Unfortunately, patient will remain tethered to telemetry and Amiodarone IV overnight. - Ativan PRN - Mittens PRN - CTM  Afib/SVT: Previously rate controlled on Sotalol 160 mg BID, but hypotensive. Cardiology evaluation in ED changed regimen to Amiodarone. Patient now on Amiodarone drip and IV Metop for management of rate.   Patient had recent fall in March; therefore, despite CHADsVASc of 5, patient likely too unsafe to anticoagulate. - Amiodarone drip today, with switch to PO tomorrow, per EP - Metoprolol 2.5 mg IV q6hours, per EP  ICM s/p ICD placement: Patient received 5 inappropriate shocks for misinterpretation of SVT. Cardiology reprogrammed ICD to only shock for ventricular rates > 200 bpm. - Amio drip  Dysphagia: Dysphagia to some pills and hard solid foods.  - Barium swallow shows mild oral phase dysphagia and moderate pharyngeal phase dysphagia  Malnutrition: Chronically ill appearing male with decreased appetite, weight loss, low body fat. - Ensure TID and Magic Cup TID - PT/OT  FEN/GI: - Dysphagia 2, Nectar thick - Protonix  DVT Ppx: Lovenox  Dispo: Disposition is deferred at this time, awaiting improvement of current medical problems.  Anticipated discharge in approximately 1 day(s).   The  patient does have a current PCP Janie Morning, DO) and does need an [facility] hospital follow-up appointment after discharge.  The patient does not have transportation limitations that hinder transportation to clinic appointments.  .Services Needed at time of discharge: Y = Yes, Blank = No PT:   OT:   RN:   Equipment:  bedside commode   Other:        Iline Oven, MD 01/17/2015, 11:33 AM

## 2015-01-17 NOTE — Progress Notes (Signed)
SUBJECTIVE: The patient is stable this morning. Some confusion overnight. No recurrent SVT w IV amiodarone.   CURRENT MEDICATIONS: . amiodarone  200 mg Oral BID  . amoxicillin-clavulanate  1 tablet Oral BID  . Chlorhexidine Gluconate Cloth  6 each Topical Q0600  . doxycycline  100 mg Oral BID  . enoxaparin (LOVENOX) injection  40 mg Subcutaneous Q24H  . feeding supplement (ENSURE ENLIVE)  237 mL Oral TID BM  . LORazepam  0.25 mg Oral BID  . multivitamin with minerals  1 tablet Oral Daily  . mupirocin ointment  1 application Nasal BID  . pantoprazole sodium  40 mg Oral Daily  . potassium chloride  20 mEq Oral Daily  . potassium chloride  40 mEq Oral Once   . amiodarone 30 mg/hr (01/16/15 1808)    OBJECTIVE: Physical Exam: Filed Vitals:   01/16/15 1516 01/16/15 1942 01/16/15 2348 01/17/15 0450  BP: '94/58 87/42 96/51 '$ 93/50  Pulse: 71 74 71 73  Temp: 97.6 F (36.4 C) 97.9 F (36.6 C) 97.6 F (36.4 C) 97.7 F (36.5 C)  TempSrc: Oral Oral Oral Oral  Resp: '14 17 16 19  '$ Height:      Weight:    129 lb (58.514 kg)  SpO2: 91% 95% 99% 98%    Intake/Output Summary (Last 24 hours) at 01/17/15 0388 Last data filed at 01/16/15 2100  Gross per 24 hour  Intake    237 ml  Output    100 ml  Net    137 ml    Telemetry reveals sinus rhythm  GEN- The patient is frail, elderly, and cachectic appearing, alert and oriented x 3 today.   Head- normocephalic, atraumatic Eyes-  Sclera clear, conjunctiva pink Ears- hearing intact Oropharynx- clear Neck- supple  Lungs- Decreased BS L lung base, normal work of breathing Heart- Regular rate and rhythm  GI- soft, NT, ND, + BS Extremities- no clubbing, cyanosis, or edema Skin- no rash or lesion Psych- euthymic mood, full affect Neuro- strength and sensation are intact  LABS: Basic Metabolic Panel:  Recent Labs  01/15/15 1842 01/16/15 0457 01/17/15 0328  NA  --  134* 134*  K  --  3.9 3.5  CL  --  99* 99*  CO2  --  27 28    GLUCOSE  --  99 106*  BUN  --  10 12  CREATININE  --  0.77 0.75  CALCIUM  --  8.2* 8.2*  MG 1.8  --   --    Liver Function Tests:  Recent Labs  01/16/15 0457  AST 14*  ALT 8*  ALKPHOS 54  BILITOT 0.5  PROT 5.6*  ALBUMIN 2.7*   CBC:  Recent Labs  01/15/15 0959  WBC 6.0  HGB 11.2*  HCT 32.9*  MCV 94.5  PLT 292    RADIOLOGY: Dg Chest 2 View 01/15/2015   CLINICAL DATA:  81 year old male with dizziness, AICD discharge today. Productive cough. Initial encounter.  EXAM: CHEST  2 VIEW  COMPARISON:  01/13/2015 and earlier.  FINDINGS: Seated upright AP and lateral views of the chest. Stable left chest cardiac AICD. Stable cardiac size and mediastinal contours. No pneumothorax or pulmonary edema. Worsening confluence of opacity at the left lower lobe. Possible small pleural effusion. Ventilation elsewhere is stable. Nodular changes in the right apex again noted. Osteopenia. Lower thoracic compression fracture appears stable. Stable cholecystectomy clips.  IMPRESSION: 1. Increasing opacity at the left lung base suspicious for pneumonia but might reflect worsening  atelectasis. Possible small left pleural effusion. 2. Otherwise stable chest   Electronically Signed   By: Genevie Ann M.D.   On: 01/15/2015 10:45    ASSESSMENT AND PLAN:  Principal Problem:   Left lower lobe pneumonia Active Problems:   GERD   Dysphagia, oropharyngeal phase   Ischemic cardiomyopathy   Paroxysmal atrial fibrillation (HCC)   Defibrillator discharge   Lung mass RUL 1.1x 1.2 cm    COPD with emphysema (HCC)   Protein-calorie malnutrition, severe ([place])  1. Atrial tachycardia/SVT The patient received inappropriate ICD therapy for atrial tachycardia/SVT in the context of decreased Sotalol dosing.  Recurrent SVT with 1:1 conduction yesterday pace terminated into atrial fibrillation. IV amiodarone started with no recurrence overnight Continue IV amiodarone today and change to po tomorrow Discussed deactivation of  ICD therapies with patient today, he is reflecting. Would recommend palliative care consult for goals of care.   2. Paroxysmal atrial fibrillation Newly identified on device interrogation CHADS2VASC is at least 5 Not a candidate for [facility] per Dr Lovena Le with high fall risk  3. Pneumonia Management per primary team  Chanetta Marshall, NP 01/17/2015 9:29 AM  EP Attending  Patient seen and examined. Agree with above. CV - RRR, Lungs - clear except basilar rales. His atrial tachycardia is stable. He will continue IV amio and until Saturday night, then switch to po 200 bid. Ok to discharge on Sunday. He can switched to cardiology service if not thought to have active pneumonia.  Mikle Bosworth.D.

## 2015-01-18 DIAGNOSIS — E43 Unspecified severe protein-calorie malnutrition: Secondary | ICD-10-CM

## 2015-01-18 DIAGNOSIS — K219 Gastro-esophageal reflux disease without esophagitis: Secondary | ICD-10-CM

## 2015-01-18 DIAGNOSIS — F419 Anxiety disorder, unspecified: Secondary | ICD-10-CM

## 2015-01-18 DIAGNOSIS — I959 Hypotension, unspecified: Secondary | ICD-10-CM

## 2015-01-18 DIAGNOSIS — Z515 Encounter for palliative care: Secondary | ICD-10-CM | POA: Diagnosis present

## 2015-01-18 LAB — EXPECTORATED SPUTUM ASSESSMENT W REFEX TO RESP CULTURE: SPECIAL REQUESTS: NORMAL

## 2015-01-18 LAB — EXPECTORATED SPUTUM ASSESSMENT W GRAM STAIN, RFLX TO RESP C

## 2015-01-18 MED ORDER — DIGOXIN 125 MCG PO TABS
0.0625 mg | ORAL_TABLET | Freq: Every day | ORAL | Status: DC
  Administered 2015-01-20: 0.0625 mg via ORAL
  Filled 2015-01-18: qty 1

## 2015-01-18 MED ORDER — AMIODARONE HCL 200 MG PO TABS: 200.0000 mg | ORAL_TABLET | Freq: Two times a day (BID) | ORAL | Status: DC

## 2015-01-18 MED ORDER — PROMETHAZINE HCL 25 MG/ML IJ SOLN
12.5000 mg | Freq: Four times a day (QID) | INTRAMUSCULAR | Status: DC | PRN
  Filled 2015-01-18: qty 1

## 2015-01-18 MED ORDER — DIGOXIN 250 MCG PO TABS
0.2500 mg | ORAL_TABLET | Freq: Four times a day (QID) | ORAL | Status: AC
  Administered 2015-01-18 – 2015-01-19 (×3): 0.25 mg via ORAL
  Filled 2015-01-18 (×2): qty 1

## 2015-01-18 NOTE — Progress Notes (Signed)
Patient Profile: 81 y.o. male with a past medical history significant for CAD, ICM s/p ICD, ventricular tachycardia with previous appropriate ICD therapies, lung cancer s/p radiation treatment, and newly identified atrial arrhythmias. He presented today to the ER after developing acute onset dizziness while walking into the kitchen and then having 4 ICD shocks.Device nterrogation demonstrates inappropriate ICD therapy for a 1:1 SVT with atrial undersensing and classification of the rhythm as VT. Device reprogrammed for now to VF only zone at 220bpm.  Subjective: Complaining of left side chest/ LUQ pain.   Objective: Vital signs in last 24 hours: Temp:  [97.9 F (36.6 C)-98.2 F (36.8 C)] 98.2 F (36.8 C) (10/08 0451) Pulse Rate:  [66-150] 150 (10/08 0746) Resp:  [15-23] 22 (10/08 0746) BP: (92-125)/(39-89) 97/39 mmHg (10/08 0746) SpO2:  [95 %-100 %] 95 % (10/08 0746) Weight:  [131 lb 9.6 oz (59.693 kg)] 131 lb 9.6 oz (59.693 kg) (10/08 0451) Last BM Date: 01/17/15  Intake/Output from previous day: 10/07 0701 - 10/08 0700 In: 240 [P.O.:240] Out: 300 [Urine:300] Intake/Output this shift:    Medications Current Facility-Administered Medications  Medication Dose Route Frequency Provider Last Rate Last Dose  . acetaminophen (TYLENOL) tablet 650 mg  650 mg Oral Q6H PRN Jones Bales, MD   650 mg at 01/18/15 1610  . amiodarone (NEXTERONE PREMIX) 360 MG/200ML (1.8 mg/mL) IV infusion  30 mg/hr Intravenous Continuous Patsey Berthold, NP 16.7 mL/hr at 01/17/15 2350 30 mg/hr at 01/17/15 2350  . amiodarone (PACERONE) tablet 200 mg  200 mg Oral BID Evans Lance, MD   Stopped at 01/17/15 2314  . amoxicillin-clavulanate (AUGMENTIN) 875-125 MG per tablet 1 tablet  1 tablet Oral BID Iline Oven, MD   1 tablet at 01/18/15 [phone]  . Chlorhexidine Gluconate Cloth 2 % PADS 6 each  6 each Topical Q0600 Michel Bickers, MD   6 each at 01/18/15 [phone]  . diclofenac sodium (VOLTAREN) 1 %  transdermal gel 2 g  2 g Topical QID PRN Loleta Chance, MD      . doxycycline (VIBRA-TABS) tablet 100 mg  100 mg Oral BID Iline Oven, MD   100 mg at 01/18/15 0823  . enoxaparin (LOVENOX) injection 40 mg  40 mg Subcutaneous Q24H Jones Bales, MD   40 mg at 01/17/15 2240  . feeding supplement (ENSURE ENLIVE) (ENSURE ENLIVE) liquid 237 mL  237 mL Oral TID BM Ardeen Garland, RD   237 mL at 01/17/15 2015  . LORazepam (ATIVAN) tablet 0.25 mg  0.25 mg Oral BID Jones Bales, MD   0.25 mg at 01/18/15 0823  . multivitamin with minerals tablet 1 tablet  1 tablet Oral Daily Jones Bales, MD   1 tablet at 01/18/15 8119  . mupirocin ointment (BACTROBAN) 2 % 1 application  1 application Nasal BID Michel Bickers, MD   1 application at 14[birth year] [phone]  . pantoprazole sodium (PROTONIX) 40 mg/20 mL oral suspension 40 mg  40 mg Oral Daily Michel Bickers, MD   40 mg at 01/17/15 1515  . potassium chloride 20 MEQ/15ML (10%) solution 20 mEq  20 mEq Oral Daily Michel Bickers, MD   20 mEq at 01/18/15 3086  . RESOURCE THICKENUP CLEAR   Oral PRN Michel Bickers, MD      . senna-docusate (Senokot-S) tablet 1 tablet  1 tablet Oral QHS PRN Jones Bales, MD        PE: General appearance: alert, cooperative and mild distress  Neck: no carotid bruit and no JVD Lungs: bilateral diffuse rhochi Heart: irregular rhythm, tachy rate Extremities: no LEE Pulses: 2+ and symmetric Skin: warm and dry Neurologic: Grossly normal  Lab Results:   Recent Labs  01/15/15 0959  WBC 6.0  HGB 11.2*  HCT 32.9*  PLT 292   BMET  Recent Labs  01/15/15 0959 01/16/15 0457 01/17/15 0328  NA 133* 134* 134*  K 3.6 3.9 3.5  CL 97* 99* 99*  CO2 '28 27 28  '$ GLUCOSE 112* 99 106*  BUN '10 10 12  '$ CREATININE 0.86 0.77 0.75  CALCIUM 8.6* 8.2* 8.2*     Assessment/Plan  Principal Problem:   Left lower lobe pneumonia Active Problems:   GERD   Dysphagia, oropharyngeal phase   Automatic implantable  cardioverter-defibrillator in situ   Ischemic cardiomyopathy   Paroxysmal atrial fibrillation (HCC)   Defibrillator discharge   Lung mass RUL 1.1x 1.2 cm    COPD with emphysema (HCC)   Protein-calorie malnutrition, severe (HCC)   Acute delirium   1. Atrial tachycardia/SVT: patient received inappropriate ICD therapy for atrial tachycardia/SVT in the context of decreased Sotalol dosing. Recurrent SVT with 1:1 conduction yesterday pace terminated into atrial fibrillation. IV amiodarone started. Also, Dr. Lovena Le discussed deactivation of ICD therapies earlier. Patient is reflecting. Would recommend palliative care consult for goals of care.   2. Atrial Fibrillation/Flutter:  Still with tachy rate in the 130s-150s. Symptomatic with soft BP in the upper 11X systolic. Electrolytes stable (Mg is 1.8. K is 3.5). Continue IV amiodarone for now. Dr. Caryl Comes to see this am and will make further adjustments.   3. CAP: on antibiotics, management per primary team.      LOS: 1 day    Brittainy M. Rosita Fire, PA-C 01/18/2015 8:49 AM  With chest pain and palpitations wil continue amiodarone for atrial fibrillation  Will add dig; keep K replete  i am more inclined than Dr Elliot Cousin to use anticoagulation but will defer

## 2015-01-18 NOTE — Progress Notes (Signed)
Pt seen and initial consult done. No change to to plan of care. Goal is to meet with entire family. Will FU in am at 10 am to see when I can meet with pt's other brother and sister. Introduced Teacher, adult education of palliative  care and services.Full note to follow Romona Curls, ANP Palliative Medicine

## 2015-01-18 NOTE — Progress Notes (Signed)
Patient ID: [name], male   DOB: 06/10/1928, 81 y.o.   MRN: [id number]  Date: 01/18/2015  Patient name: George Acosta  Medical record number: [id number]  Date of birth: 1928/11/06   This patient's plan of care was discussed with the house staff. Please see their note for complete details. I concur with their findings. [name] continues to have cardiac instability with tachycardia and hypotension. He is also coughing up more purulent sputum today and having some left-sided chest pain. I talked him, his son and daughter at length today about goals of care and CODE STATUS. He is currently full code. His daughter told me that when he was hospitalized this past summer at [facility] he signed a DO NOT RESUSCITATE form. However, that evening he became very upset and asked that the DO NOT RESUSCITATE order be changed back to full code. He does not recall that discussion. He also does not recall having a discussion of possibly deactivating his defibrillator with Dr. Lovena Le yesterday. I talked to them about a palliative care consult to explore goals of care. They are somewhat reluctant but generally in agreement with that plan. We will put in an order for that consult. We will also obtain a sputum for Gram stain and culture and repeat chest x-ray.   Michel Bickers, MD 01/18/2015, 12:20 PM

## 2015-01-18 NOTE — Progress Notes (Signed)
Paged Dr. Melburn Hake to clarify PO Amiodarone and IV Amiodarone. BP 92/58. Order received to hold PO Amio and continue IV and PO will be started tomorrow.

## 2015-01-18 NOTE — Progress Notes (Addendum)
Subjective:  Pt seen and examined in AM. No acute events overnight. This morning he is tachycardic into the 130's with left sided chest pain, dyspnea, palpitations, and anxiety. He denies fever, chills, diaphoresis, lightheadedness, nausea, vomiting, or abdominal pain.    Objective: Vital signs in last 24 hours: Filed Vitals:   01/18/15 0145 01/18/15 0345 01/18/15 0451 01/18/15 0545  BP: 111/62 125/60 111/60 108/57  Pulse: 83 76 79 81  Temp:   98.2 F (36.8 C)   TempSrc:   Oral   Resp: '20 17 23 21  '$ Height:      Weight:   131 lb 9.6 oz (59.693 kg)   SpO2: 100% 97% 96% 95%   Weight change: 2 lb 9.6 oz (1.179 kg)  Intake/Output Summary (Last 24 hours) at 01/18/15 0809 Last data filed at 01/18/15 0230  Gross per 24 hour  Intake    240 ml  Output    300 ml  Net    -60 ml   Physical Exam  General: In mild distress complaining of left-sided chest pain, chronically ill-appearing and cachectic  Heart: Tachycardic with irregularly irregular rhythm. No chest wall tenderness.  Lungs: Ronchi in anterior fields. Could not examine posterior lung fields   Abdomen: Soft, non-tender, non-distended, normal BS Extremities: No edema   Skin: No rash  Neuro: A & O    Lab Results: Basic Metabolic Panel:  Recent Labs Lab 01/15/15 1842 01/16/15 0457 01/17/15 0328 01/17/15 0930  NA  --  134* 134*  --   K  --  3.9 3.5  --   CL  --  99* 99*  --   CO2  --  27 28  --   GLUCOSE  --  99 106*  --   BUN  --  10 12  --   CREATININE  --  0.77 0.75  --   CALCIUM  --  8.2* 8.2*  --   MG 1.8  --   --  1.8   Liver Function Tests:  Recent Labs Lab 01/16/15 0457  AST 14*  ALT 8*  ALKPHOS 54  BILITOT 0.5  PROT 5.6*  ALBUMIN 2.7*   CBC:  Recent Labs Lab 01/13/15 0339 01/15/15 0959  WBC 5.9 6.0  HGB 11.1* 11.2*  HCT 33.7* 32.9*  MCV 96.0 94.5  PLT 224 292   CBG:  Recent Labs Lab 01/16/15 2120  GLUCAP 115*   Coagulation:  Recent Labs Lab 01/13/15 0339  LABPROT 15.5*    INR 1.22   Micro Results: Recent Results (from the past 240 hour(s))  Culture, blood (routine x 2)     Status: None (Preliminary result)   Collection Time: 01/15/15  4:29 PM  Result Value Ref Range Status   Specimen Description BLOOD RIGHT ARM  Final   Special Requests BOTTLES DRAWN AEROBIC AND ANAEROBIC 5CC  Final   Culture NO GROWTH 2 DAYS  Final   Report Status PENDING  Incomplete  Culture, blood (routine x 2)     Status: None (Preliminary result)   Collection Time: 01/15/15  4:38 PM  Result Value Ref Range Status   Specimen Description BLOOD LEFT HAND  Final   Special Requests BOTTLES DRAWN AEROBIC AND ANAEROBIC 3CC  Final   Culture NO GROWTH 2 DAYS  Final   Report Status PENDING  Incomplete  MRSA PCR Screening     Status: Abnormal   Collection Time: 01/16/15  8:21 AM  Result Value Ref Range Status   MRSA by  PCR POSITIVE (A) NEGATIVE Final    Comment:        The GeneXpert MRSA Assay (FDA approved for NASAL specimens only), is one component of a comprehensive MRSA colonization surveillance program. It is not intended to diagnose MRSA infection nor to guide or monitor treatment for MRSA infections. RESULT CALLED TO, READ BACK BY AND VERIFIED WITH: J. LAUER RN 11:00 01/16/15 ([email or web address])   Culture, expectorated sputum-assessment     Status: None   Collection Time: 01/18/15  6:37 AM  Result Value Ref Range Status   Specimen Description SPUTUM  Final   Special Requests Normal  Final   Sputum evaluation   Final    MICROSCOPIC FINDINGS SUGGEST THAT THIS SPECIMEN IS NOT REPRESENTATIVE OF LOWER RESPIRATORY SECRETIONS. PLEASE RECOLLECT. Gram Stain Report Called to,Read Back By and Verified With: V CUMMINGS,RN AT 2025 01/18/15 BY L BENFIELD    Report Status 01/18/2015 FINAL  Final   Studies/Results: Dg Swallowing Func-speech Pathology  01/16/2015    Objective Swallowing Evaluation:    Patient Details  Name: George Acosta MRN: [id number] Date of Birth: 16-Feb-1929  Today's Date:  01/16/2015 Time: SLP Start Time (ACUTE ONLY): 1030-SLP Stop Time (ACUTE ONLY): 1100 SLP Time Calculation (min) (ACUTE ONLY): 30 min  Past Medical History:  Past Medical History  Diagnosis Date  . Coronary atherosclerosis of unspecified type of vessel, native or graft    . Unspecified essential hypertension   . Benign prostatic hypertrophy   . Peptic ulcer disease   . Diverticulosis   . GERD (gastroesophageal reflux disease)   . Ischemic cardiomyopathy   . Congestive heart failure, unspecified   . Arrhythmia     paroxysmal afib  . Ventricular tachycardia ([place])     A.   monomorphic VT  B. 04/2011 s/p generator change - [employer] ICD  [phone]  . MI (myocardial infarction) ([place]) 1985    "big MI after I got to hospital"  . Acute myocardial infarction, unspecified site, episode of care  unspecified 1985    "on way to hospital"  . Pneumonia 1980's  . Anxiety   . Pancreatitis 1980's  . Paroxysmal atrial fibrillation (HCC)   . AICD (automatic cardioverter/defibrillator) present     [employer]  . Presence of permanent cardiac pacemaker   . COPD (chronic obstructive pulmonary disease) ([place])   . SOB (shortness of breath)     not recently  . Arthritis   . Lung mass   . Radiation 2/2,2/4,2/8,2/10,05/24/14    Right upper lobe 60 Gy in 5 fractions   Past Surgical History:  Past Surgical History  Procedure Laterality Date  . Icd  2001; 2007; 04/28/11    generator change; complete replacement; lead replacement  . Laparotomy  11/2005    for small bowel obstruction,  . Cardiac catheterization  11/23/2005  . Cardiac defibrillator placement  1996  . Cholecystectomy  1980's  . Cataract extraction  2012    left eye  . Video bronchoscopy with endobronchial navigation Right 02/27/2014    Procedure: VIDEO BRONCHOSCOPY WITH ENDOBRONCHIAL NAVIGATION,insertion  Feducial marker right upper lobe lung;  Surgeon: Collene Gobble, MD;   Location: [place];  Service: Thoracic;  Laterality: Right;  . Implantable cardioverter defibrillator  (icd) generator change N/A  04/28/2011    Procedure: ICD GENERATOR CHANGE;  Surgeon: Evans Lance, MD;   Location: [facility] CATH LAB;  Service: Cardiovascular;  Laterality: N/A;  . Lead revision N/A 04/28/2011    Procedure: LEAD REVISION;  Surgeon: Evans Lance, MD;  Location: [facility]  CATH LAB;  Service: Cardiovascular;  Laterality: N/A;   HPI:  Other Pertinent Information: Mr. Bjelland is an 81 year old with coronary  artery disease, chronic systolic heart failure and arrhythmias. He has  been implantable defibrillator. He recently completed radiation therapy  for right upper lobe lung cancer. He has had a poor appetite and some  difficulty swallowing pills and solid food. He's had a 30 pound  unintentional weight loss recently. He has had some dizziness with  repeated shocks.  He mentioned that he had developed an increased  productive cough over the past week. He has had some shivering but no  documented fever. He denies any shortness of breath. His cough is  productive of yellow-green sputum with a sour taste. His chest x-ray shows  a new left lower lobe infiltrate.   No Data Recorded  Assessment / Plan / Recommendation CHL IP CLINICAL IMPRESSIONS 01/16/2015  Therapy Diagnosis Mild oral phase dysphagia;Moderate pharyngeal phase  dysphagia  Clinical Impression Pt demosntrates a moderate dysphagia with slow but  adequate oral function due to missing dentition. Oropharyngeal phase is  impaired and characterized by weakness across the hyolaryngeal mechanism  resulting in penetration of thin liquids during the swallow and  penetration of nectar and thin after the swallow due to residuals.  Sensation is intermittent, but pt is able to clear most penetrate with  cues for throat clearing. Bolus transit and airway protection is much  improved with nectar thick liquids and soft solids when pt completes a  chin tuck and swallows twice. Pt continues to report globus when residuals  cleared, likely due to appearance of stasis with with  distal esopahgus (no  radiologist present to confirm). Pt is resistant to teaching and is  unlikely to follow recommendations after d/c home. Requested f/u with home  health SLP to help pt and family implement diet recommendations and  strategies if possible.       CHL IP TREATMENT RECOMMENDATION 01/16/2015  Treatment Recommendations Therapy as outlined in treatment plan below     CHL IP DIET RECOMMENDATION 01/16/2015  SLP Diet Recommendations Dysphagia 2 (Fine chop);Nectar  Liquid Administration via (None)  Medication Administration Crushed with puree  Compensations Chin tuck;Slow rate;Small sips/bites;Multiple dry swallows  after each bite/sip;Clear throat intermittently  Postural Changes and/or Swallow Maneuvers (None)     CHL IP OTHER RECOMMENDATIONS 01/16/2015  Recommended Consults (None)  Oral Care Recommendations Oral care BID  Other Recommendations Order thickener from pharmacy     No flowsheet data found.   No flowsheet data found.   Pertinent Vitals/Pain NA    SLP Swallow Goals No flowsheet data found.  No flowsheet data found.    CHL IP REASON FOR REFERRAL 01/16/2015  Reason for Referral Objectively evaluate swallowing function     CHL IP ORAL PHASE 01/16/2015  Lips (None)  Tongue (None)  Mucous membranes (None)  Nutritional status (None)  Other (None)  Oxygen therapy (None)  Oral Phase WFL  Oral - Pudding Teaspoon (None)  Oral - Pudding Cup (None)  Oral - Honey Teaspoon (None)  Oral - Honey Cup (None)  Oral - Honey Syringe (None)  Oral - Nectar Teaspoon (None)  Oral - Nectar Cup (None)  Oral - Nectar Straw (None)  Oral - Nectar Syringe (None)  Oral - Ice Chips (None)  Oral - Thin Teaspoon (None)  Oral - Thin Cup (None)  Oral - Thin Straw (None)  Oral - Thin Syringe (None)  Oral - Puree (None)  Oral - Mechanical Soft (None)  Oral - Regular (None)  Oral - Multi-consistency (None)  Oral - Pill (None)  Oral Phase - Comment (None)      CHL IP PHARYNGEAL PHASE 01/16/2015  Pharyngeal Phase Impaired  Pharyngeal -  Pudding Teaspoon (None)  Penetration/Aspiration details (pudding teaspoon) (None)  Pharyngeal - Pudding Cup (None)  Penetration/Aspiration details (pudding cup) (None)  Pharyngeal - Honey Teaspoon (None)  Penetration/Aspiration details (honey teaspoon) (None)  Pharyngeal - Honey Cup (None)  Penetration/Aspiration details (honey cup) (None)  Pharyngeal - Honey Syringe (None)  Penetration/Aspiration details (honey syringe) (None)  Pharyngeal - Nectar Teaspoon (None)  Penetration/Aspiration details (nectar teaspoon) (None)  Pharyngeal - Nectar Cup (None)  Penetration/Aspiration details (nectar cup) (None)  Pharyngeal - Nectar Straw (None)  Penetration/Aspiration details (nectar straw) (None)  Pharyngeal - Nectar Syringe (None)  Penetration/Aspiration details (nectar syringe) (None)  Pharyngeal - Ice Chips (None)  Penetration/Aspiration details (ice chips) (None)  Pharyngeal - Thin Teaspoon (None)  Penetration/Aspiration details (thin teaspoon) (None)  Pharyngeal - Thin Cup (None)  Penetration/Aspiration details (thin cup) (None)  Pharyngeal - Thin Straw (None)  Penetration/Aspiration details (thin straw) (None)  Pharyngeal - Thin Syringe (None)  Penetration/Aspiration details (thin syringe') (None)  Pharyngeal - Puree (None)  Penetration/Aspiration details (puree) (None)  Pharyngeal - Mechanical Soft (None)  Penetration/Aspiration details (mechanical soft) (None)  Pharyngeal - Regular (None)  Penetration/Aspiration details (regular) (None)  Pharyngeal - Multi-consistency (None)  Penetration/Aspiration details (multi-consistency) (None)  Pharyngeal - Pill (None)  Penetration/Aspiration details (pill) (None)  Pharyngeal Comment (None)      No flowsheet data found.  CHL IP GO 01/16/2015  Functional Assessment Tool Used clinical judgement  Functional Limitations Swallowing  Swallow Current Status [phone]) CK  Swallow Goal Status (N3976) CJ  Swallow Discharge Status [phone]) (None)  Motor Speech Current Status [phone]) (None)   Motor Speech Goal Status (I0973) (None)  Motor Speech Goal Status (Z3299) (None)  Spoken Language Comprehension Current Status (M4268) (None)  Spoken Language Comprehension Goal Status (T4196) (None)  Spoken Language Comprehension Discharge Status [phone]) (None)  Spoken Language Expression Current Status [phone]) (None)  Spoken Language Expression Goal Status [phone]) (None)  Spoken Language Expression Discharge Status [phone]) (None)  Attention Current Status (G8185) (None)  Attention Goal Status (U3149) (None)  Attention Discharge Status [phone]) (None)  Memory Current Status (Z8588) (None)  Memory Goal Status (F0277) (None)  Memory Discharge Status (A1287) (None)  Voice Current Status (O6767) (None)  Voice Goal Status (M0947) (None)  Voice Discharge Status (S9628) (None)  Other Speech-Language Pathology Functional Limitation [phone]) (None)  Other Speech-Language Pathology Functional Limitation Goal Status (U7654)  (None)  Other Speech-Language Pathology Functional Limitation Discharge Status  [phone]) (None)          Herbie Baltimore, MA CCC-SLP [phone]  DeBlois, Katherene Ponto 01/16/2015, 12:24 PM    Medications: I have reviewed the patient's current medications. Scheduled Meds: . amiodarone  200 mg Oral BID  . amoxicillin-clavulanate  1 tablet Oral BID  . Chlorhexidine Gluconate Cloth  6 each Topical Q0600  . doxycycline  100 mg Oral BID  . enoxaparin (LOVENOX) injection  40 mg Subcutaneous Q24H  . feeding supplement (ENSURE ENLIVE)  237 mL Oral TID BM  . LORazepam  0.25 mg Oral BID  . multivitamin with minerals  1 tablet Oral Daily  . mupirocin ointment  1 application Nasal BID  . pantoprazole sodium  40 mg Oral Daily  . potassium chloride  20 mEq Oral  Daily   Continuous Infusions: . amiodarone 30 mg/hr (01/17/15 2350)   PRN Meds:.acetaminophen, diclofenac sodium, RESOURCE THICKENUP CLEAR, senna-docusate Assessment/Plan:  Mr. Gural is an 81 yo male with CAD, ICM s/p ICD placement, COPD, NSCLC  s/p radiation, and h/o left femoral fracture, with LLL CAP.  Atrial Tachycardia/SVT and Atrial Fibrillation/flutter with RVR - Pt symptomatic with chest pain, dyspnea, palpitations, and anxiety with HR in 130's this AM with irregularly irregular rhythm and soft blood pressure in 90-100's.  Pt with CHADsVASc score of 5 however due to high risk of bleeding, pt is not currently on [facility] therapy. Pt has ICM recently reprogrammed for VF 220 bpm.  -Appreciate cardiology recommendations   -Monitor on telemetry  -Continue IV amiodarone with transition to PO 200 mg BID per cardiology  -Continue kdur 20 mEq daily  -Monitor BMP and Mg levels -Will consult palliative care to discuss goals of care and code status with pt and family   -Continue PT and OT  -Pt has follow-up appt with Dr. Lovena Le on 02/06/15  Left Lower Lobe Community Acquired Pneumonia - Pt afebrile with left sided chest pain, dyspnea, and productive cough.   -Oxygen therapy to keep SpO2 >88% in setting of COPD -Repeat chest xray in AM in setting of left sided chest pain/dyspnea to assess for complications such as effusion/emypema  -Follow-up blood cultures x 2 from admission  -Obtain sputum culture and gram stain -Continue PO doxycyline 100 mg BID and augmentin 875-125 mg BID Day 4/7 -Continue tylenol 650 mg Q 6 hr PRN pain  -Will need repeat chest xray in 4 weeks to ensure resolution  Anxiety  - Pt with anxiety this AM in setting of chest pain and palpitations.   -Continue home ativan 0.25 mg BID   Severe Protein Calore Malnutrition - Pt with BMI 20.26.  -Continue Ensure enlive 237 mL TID and magic cup TID with meals  -Continue PT and OT  GERD - No acid reflux symptoms.  -Continue protonix 40 mg daily  Diet: Dysphagia 2 DVT Ppx: Lovenox  Code: Full   Dispo: Disposition is deferred at this time, awaiting improvement of current medical problems.  Anticipated discharge in approximately 2-4 day(s).   The patient does have a current  PCP Janie Morning, DO) and does need an [facility] hospital follow-up appointment after discharge.  The patient does not have transportation limitations that hinder transportation to clinic appointments.  .Services Needed at time of discharge: Y = Yes, Blank = No PT:   OT:   RN:   Equipment:  bedside commode   Other:  home health SLP    LOS: 1 day   Juluis Mire, MD 01/18/2015, 8:09 AM

## 2015-01-18 NOTE — Progress Notes (Signed)
Notified Dr. Benjamine Mola that patient remains on Amiodarone drip, HR 126 at this time and PO Amiodarone is due. Received order to hold PO.

## 2015-01-19 ENCOUNTER — Inpatient Hospital Stay (HOSPITAL_COMMUNITY): Payer: Medicare HMO

## 2015-01-19 DIAGNOSIS — Z7189 Other specified counseling: Secondary | ICD-10-CM | POA: Diagnosis present

## 2015-01-19 DIAGNOSIS — Z515 Encounter for palliative care: Secondary | ICD-10-CM

## 2015-01-19 LAB — BASIC METABOLIC PANEL
ANION GAP: 10 (ref 5–15)
BUN: 8 mg/dL (ref 6–20)
CALCIUM: 9 mg/dL (ref 8.9–10.3)
CO2: 29 mmol/L (ref 22–32)
CREATININE: 0.82 mg/dL (ref 0.61–1.24)
Chloride: 95 mmol/L — ABNORMAL LOW (ref 101–111)
GLUCOSE: 114 mg/dL — AB (ref 65–99)
Potassium: 4.1 mmol/L (ref 3.5–5.1)
Sodium: 134 mmol/L — ABNORMAL LOW (ref 135–145)

## 2015-01-19 LAB — CBC
HCT: 34.5 % — ABNORMAL LOW (ref 39.0–52.0)
Hemoglobin: 11.5 g/dL — ABNORMAL LOW (ref 13.0–17.0)
MCH: 32 pg (ref 26.0–34.0)
MCHC: 33.3 g/dL (ref 30.0–36.0)
MCV: 96.1 fL (ref 78.0–100.0)
PLATELETS: 279 10*3/uL (ref 150–400)
RBC: 3.59 MIL/uL — ABNORMAL LOW (ref 4.22–5.81)
RDW: 14.4 % (ref 11.5–15.5)
WBC: 5.4 10*3/uL (ref 4.0–10.5)

## 2015-01-19 LAB — PHOSPHORUS: PHOSPHORUS: 3.3 mg/dL (ref 2.5–4.6)

## 2015-01-19 LAB — MAGNESIUM: MAGNESIUM: 1.8 mg/dL (ref 1.7–2.4)

## 2015-01-19 MED ORDER — APIXABAN 2.5 MG PO TABS
2.5000 mg | ORAL_TABLET | Freq: Two times a day (BID) | ORAL | Status: DC
Start: 2015-01-19 — End: 2015-01-20
  Administered 2015-01-19 (×2): 2.5 mg via ORAL
  Filled 2015-01-19 (×3): qty 1

## 2015-01-19 NOTE — Progress Notes (Addendum)
Patient Profile: 81 y.o. male with a past medical history significant for CAD, ICM s/p ICD, ventricular tachycardia with previous appropriate ICD therapies, lung cancer s/p radiation treatment, and newly identified atrial arrhythmias. He presented today to the ER after developing acute onset dizziness while walking into the kitchen and then having 4 ICD shocks.Device nterrogation demonstrates inappropriate ICD therapy for a 1:1 SVT with atrial undersensing and classification of the rhythm as VT. Device reprogrammed for now to VF only zone at 220bpm.  Subjective: Feeling better without sign sob  His pneumonia is improving  He has jknown lung Ca but i think the lesion was RUL     Objective: Vital signs in last 24 hours: Temp:  [97.5 F (36.4 C)-98.5 F (36.9 C)] 97.5 F (36.4 C) (10/09 0423) Pulse Rate:  [57-155] 128 (10/09 0345) Resp:  [15-32] 25 (10/09 0345) BP: (91-107)/(60-68) 107/64 mmHg (10/09 0345) SpO2:  [93 %-96 %] 95 % (10/09 0345) Weight:  [133 lb 9.6 oz (60.601 kg)] 133 lb 9.6 oz (60.601 kg) (10/09 0423) Last BM Date: 01/18/15  Intake/Output from previous day: 10/08 0701 - 10/09 0700 In: -  Out: 1100 [Urine:1100] Intake/Output this shift:    Medications Current Facility-Administered Medications  Medication Dose Route Frequency Provider Last Rate Last Dose  . acetaminophen (TYLENOL) tablet 650 mg  650 mg Oral Q6H PRN Jones Bales, MD   650 mg at 01/18/15 2210  . amiodarone (NEXTERONE PREMIX) 360 MG/200ML (1.8 mg/mL) IV infusion  30 mg/hr Intravenous Continuous Patsey Berthold, NP 16.7 mL/hr at 01/18/15 2338 30 mg/hr at 01/18/15 2338  . amiodarone (PACERONE) tablet 200 mg  200 mg Oral BID Collier Salina, MD      . amoxicillin-clavulanate (AUGMENTIN) 875-125 MG per tablet 1 tablet  1 tablet Oral BID Iline Oven, MD   1 tablet at 01/18/15 2210  . Chlorhexidine Gluconate Cloth 2 % PADS 6 each  6 each Topical Q0600 Michel Bickers, MD   6 each at 01/19/15  0600  . diclofenac sodium (VOLTAREN) 1 % transdermal gel 2 g  2 g Topical QID PRN Loleta Chance, MD      . Derrill Memo ON 01/20/2015] digoxin (LANOXIN) tablet 0.0625 mg  0.0625 mg Oral Daily Deboraha Sprang, MD      . doxycycline (VIBRA-TABS) tablet 100 mg  100 mg Oral BID Iline Oven, MD   100 mg at 01/18/15 2210  . enoxaparin (LOVENOX) injection 40 mg  40 mg Subcutaneous Q24H Jones Bales, MD   40 mg at 01/18/15 2208  . feeding supplement (ENSURE ENLIVE) (ENSURE ENLIVE) liquid 237 mL  237 mL Oral TID BM Ardeen Garland, RD   237 mL at 01/18/15 1229  . LORazepam (ATIVAN) tablet 0.25 mg  0.25 mg Oral BID Jones Bales, MD   0.25 mg at 01/18/15 2210  . multivitamin with minerals tablet 1 tablet  1 tablet Oral Daily Jones Bales, MD   1 tablet at 01/18/15 8242  . mupirocin ointment (BACTROBAN) 2 % 1 application  1 application Nasal BID Michel Bickers, MD   1 application at 35/36/14 2210  . pantoprazole sodium (PROTONIX) 40 mg/20 mL oral suspension 40 mg  40 mg Oral Daily Michel Bickers, MD   40 mg at 01/18/15 1220  . potassium chloride 20 MEQ/15ML (10%) solution 20 mEq  20 mEq Oral Daily Michel Bickers, MD   20 mEq at 01/18/15 4315  . promethazine (PHENERGAN) injection 12.5 mg  12.5  mg Intravenous Q6H PRN Juluis Mire, MD      . RESOURCE THICKENUP CLEAR   Oral PRN Michel Bickers, MD      . senna-docusate (Senokot-S) tablet 1 tablet  1 tablet Oral QHS PRN Jones Bales, MD        PE: General appearance: alert, cooperative and mild distress Neck: no carotid bruit and no JVD Lungs: bilateral diffuse rhochi Heart: irregular rhythm, tachy rate Extremities: no LEE Pulses: 2+ and symmetric Skin: warm and dry Neurologic: Grossly normal  Lab Results:   Recent Labs  01/19/15 0419  WBC 5.4  HGB 11.5*  HCT 34.5*  PLT 279   BMET  Recent Labs  01/17/15 0328 01/19/15 0419  NA 134* 134*  K 3.5 4.1  CL 99* 95*  CO2 28 29  GLUCOSE 106* 114*  BUN 12 8  CREATININE 0.75 0.82    CALCIUM 8.2* 9.0     Assessment/Plan  Principal Problem:   Left lower lobe pneumonia Active Problems:   GERD   Dysphagia, oropharyngeal phase   Automatic implantable cardioverter-defibrillator in situ   Ischemic cardiomyopathy   Paroxysmal atrial fibrillation (HCC)   Defibrillator discharge   Lung mass RUL 1.1x 1.2 cm    COPD with emphysema (HCC)   Protein-calorie malnutrition, severe ([place])   Acute delirium   Palliative care encounter   1. Atrial tachycardia/SVT: patient received inappropriate ICD therapy for atrial tachycardia/SVT in the context of decreased Sotalol dosing. Recurrent SVT with 1:1 conduction yesterday pace terminated into atrial fibrillation. IV amiodarone started. Also, Dr. Lovena Le discussed deactivation of ICD therapies earlier. Patient is reflecting. Would recommend palliative care consult for goals of care.   2. Atrial Fibrillation/Flutter:  Still with tachy rate in the 130s-150s. Symptomatic with soft BP in the upper 02D systolic. Electrolytes stable (Mg is 1.8. K is 3.5). Continue IV amiodarone for now. Dr. Caryl Comes to see this am and will make further adjustments.   3. CAP: on antibiotics, management per primary team.    Continue IV amio for now   With chest pain and palpitations wil continue amiodarone for atrial fibrillation  Added dig without benefit  Will check echo to look at LA dimension  Begin NOAC with anticipation of DCCV-TEE or AV Ablation for control of HR   It is not really tenable to let his HR race like this   I would favor the former as device reports have shown very little AF prior to this and hopefully with resolution of hte pneumonia this would prevent recurrent AFib  I am cross covering, but i very much appreiciate the help of Palliative Care and IM in addressing the goals of care issue   i was asked by palliative care to weigh in on the issue of ICD programming on or off and we discussed with family present the role of ICD for  preservation of thecurrent quality of life and he was quite clear that he would like to do this  Time spent 55 min

## 2015-01-19 NOTE — Progress Notes (Addendum)
Patients HR up to 166 while turning in bed to use urinal. Notified Dr. Benjamine Mola. While continue to closely monitor this patient.  Spoke with Dr. Benjamine Mola is [facility] with patients current HR of 126 at rest. Will notify if rate goes up again.

## 2015-01-19 NOTE — Consult Note (Addendum)
Consultation Note Date: 01/19/2015   Patient Name: George Acosta  DOB: 09-12-1928  MRN: [id number]  Age / Sex: 81 y.o., male   PCP: Janie Morning, DO Referring Physician: Michel Bickers, MD  Reason for Consultation: Establishing goals of care  Palliative Care Assessment and Plan Summary of Established Goals of Care and Medical Treatment Preferences   Clinical Assessment/Narrative: Pt is a 81 yo man with h/o COPDin March of this year. He presented after ICD shocked him 5 times. Interrogation of ICD revealed that these CAD, MI dating back to the 72's. He has an AICD, and new DX of non-small cell lung CA the first of this year for which he recently finished radiation teatment. He also had recent hip fracture. He presented after being shocked by ICD 5 times. Interrogation of ICD revealed that these were inappropriate shocks.  His ICD as since been reset not to fire unless ventricular greater than 200 until goals of car can be established. George Acosta has been a DNR in the recent past but has since changed his status back to Full Code. He also has spoken with cardiology about deactivating ICD. Met with pt and one of his sons and daughters at the bedside. There are 2 other children. George Acosta is alert. Reports he feels better. Family appeared reluctant to meet with me. I informed them I was not with hospice and my role was to be a resource and source of support.We did speak about disease progression r/t cardiomyopathy, role of ICD, and management of symptoms, as well as risks and benefits of CPR, defibrillation, and intubation. Children inform me that they make decisions as a family and that 1 brother is sick and they are trying to see when he can participate in [place]. At this point they do seem interested in discussing these matters Pt is addition to above mentioned cardiac hx is now being treated for PNA, . He also is beginning to exhibit dysphagia we he talks about extensively. He has been seen by SLP and  dysphagia diet recommended  Contacts/Participants in Discussion: Primary Decision Maker: Pt however they do make decision as a family HCPOA: no  Met with pt and daughter George Acosta and son, George Acosta is another son and daughter who would need to be involved to finalize deciions  Code Status/Advance Care Planning:  Full Code   Symptom Management:   Dyspnea: Cont 02, supportive resp treatment, abx  Palliative Prophylaxis: bowel regimen  Psycho-social/Spiritual:   Support System: yes  Desire for further Chaplaincy support:no  Prognosis: < 6 months. I did not share this with pt and family as they were very reluctant to meet with me initially. I do feel that he would meet hospice in-patient criteria given his ICM and assoc debility, COPD and now dysphagia. High risk for acute cardiopulmonary failure, and/or asp pna  Discharge Planning:  Likely back home       Chief Complaint/History of Present Illness: Pt is a 81 yo man admitted after ICD firing 5 times. He reports a 1 week hx of productive cough. CXR reveled LLL PNA  Primary Diagnoses  Present on Admission:  . COPD with emphysema ([place]) . Ischemic cardiomyopathy . Paroxysmal atrial fibrillation (HCC) . Dysphagia, oropharyngeal phase . GERD . Protein-calorie malnutrition, severe ([place]) . Lung mass RUL 1.1x 1.2 cm  . Automatic implantable cardioverter-defibrillator in situ  Palliative Review of Systems: Denies pain, SHOB but he is coughing, reporting trouble swallowing I have reviewed the medical record, interviewed the patient and  family, and examined the patient. The following aspects are pertinent.  Past Medical History  Diagnosis Date  . Coronary atherosclerosis of unspecified type of vessel, native or graft   . Unspecified essential hypertension   . Benign prostatic hypertrophy   . Peptic ulcer disease   . Diverticulosis   . GERD (gastroesophageal reflux disease)   . Ischemic cardiomyopathy   . Congestive heart  failure, unspecified   . Arrhythmia     paroxysmal afib  . Ventricular tachycardia ([place])     A.   monomorphic VT  B. 04/2011 s/p generator change - [employer] ICD  [phone]  . MI (myocardial infarction) ([place]) 1985    "big MI after I got to hospital"  . Acute myocardial infarction, unspecified site, episode of care unspecified 1985    "on way to hospital"  . Pneumonia 1980's  . Anxiety   . Pancreatitis 1980's  . Paroxysmal atrial fibrillation (HCC)   . AICD (automatic cardioverter/defibrillator) present     [employer]  . Presence of permanent cardiac pacemaker   . COPD (chronic obstructive pulmonary disease) ([place])   . SOB (shortness of breath)     not recently  . Arthritis   . Lung mass   . Radiation 2/2,2/4,2/8,2/10,05/24/14    Right upper lobe 60 Gy in 5 fractions   Social History   Social History  . Marital Status: Widowed    Spouse Name: N/A  . Number of Children: N/A  . Years of Education: N/A   Occupational History  . retired Administrator    Social History Main Topics  . Smoking status: Former Smoker -- 2.00 packs/day for 40 years    Types: Cigarettes    Quit date: 04/18/1983  . Smokeless tobacco: Former Systems developer    Types: Snuff, Chew     Comment: quit smoking cigarettes 1985; quit smokeless tobacco ~ 2011  . Alcohol Use: No  . Drug Use: No  . Sexual Activity: No   Other Topics Concern  . None   Social History Narrative   The patient lives in [place]. He is widowed. 2 boys 2girls   He is a  retired Administrator   Patient is former smoker no tobacco use   Caffeine use - 3 cups   Family History  Problem Relation Age of Onset  . Heart disease Mother   . Heart disease Daughter   . Cancer Daughter    Scheduled Meds: . amiodarone  200 mg Oral BID  . amoxicillin-clavulanate  1 tablet Oral BID  . Chlorhexidine Gluconate Cloth  6 each Topical Q0600  . [START ON 01/20/2015] digoxin  0.0625 mg Oral Daily  . doxycycline  100 mg Oral BID  .  enoxaparin (LOVENOX) injection  40 mg Subcutaneous Q24H  . feeding supplement (ENSURE ENLIVE)  237 mL Oral TID BM  . LORazepam  0.25 mg Oral BID  . multivitamin with minerals  1 tablet Oral Daily  . mupirocin ointment  1 application Nasal BID  . pantoprazole sodium  40 mg Oral Daily  . potassium chloride  20 mEq Oral Daily   Continuous Infusions: . amiodarone 30 mg/hr (01/18/15 2338)   PRN Meds:.acetaminophen, diclofenac sodium, promethazine, RESOURCE THICKENUP CLEAR, senna-docusate Medications Prior to Admission:  Prior to Admission medications   Medication Sig Start Date End Date Taking? Authorizing Provider  acetaminophen (TYLENOL) 325 MG tablet Take 650 mg by mouth every 6 (six) hours as needed for mild pain.   Yes Historical Provider, MD  guaiFENesin-codeine (ROBITUSSIN AC) 100-10 MG/5ML syrup Take 5 mLs by mouth 3 (three) times daily as needed for cough.   Yes Historical Provider, MD  LORazepam (ATIVAN) 0.5 MG tablet Take 0.25 mg by mouth 2 (two) times daily.   Yes Historical Provider, MD  Multiple Vitamin (MULTIVITAMIN WITH MINERALS) TABS Take 1 tablet by mouth daily.   Yes Historical Provider, MD  nitroGLYCERIN (NITROSTAT) 0.4 MG SL tablet Place 1 tablet (0.4 mg total) under the tongue every 5 (five) minutes as needed. For chest pain Patient taking differently: Place 0.4 mg under the tongue every 5 (five) minutes as needed for chest pain.  03/16/13  Yes Evans Lance, MD  pantoprazole (PROTONIX) 40 MG tablet Take 40 mg by mouth daily.    Yes Historical Provider, MD  potassium chloride SA (K-DUR,KLOR-CON) 20 MEQ tablet Take 20 mEq by mouth daily.     Yes Historical Provider, MD  ranitidine (ZANTAC) 150 MG tablet Take 150 mg by mouth at bedtime.    Yes Historical Provider, MD  sotalol (BETAPACE) 80 MG tablet TAKE 2 TABLETS TWICE DAILY. Patient taking differently: TAKE  1  TABLETS TWICE DAILY. 03/26/14  Yes Evans Lance, MD   Allergies  Allergen Reactions  . Sulfonamide  Derivatives Hives   CBC:    Component Value Date/Time   WBC 5.4 01/19/2015 0419   HGB 11.5* 01/19/2015 0419   HCT 34.5* 01/19/2015 0419   PLT 279 01/19/2015 0419   MCV 96.1 01/19/2015 0419   NEUTROABS 3.8 03/12/2012 2150   LYMPHSABS 1.5 03/12/2012 2150   MONOABS 0.8 03/12/2012 2150   EOSABS 0.1 03/12/2012 2150   BASOSABS 0.1 03/12/2012 2150   Comprehensive Metabolic Panel:    Component Value Date/Time   NA 134* 01/19/2015 0419   K 4.1 01/19/2015 0419   CL 95* 01/19/2015 0419   CO2 29 01/19/2015 0419   BUN 8 01/19/2015 0419   CREATININE 0.82 01/19/2015 0419   GLUCOSE 114* 01/19/2015 0419   CALCIUM 9.0 01/19/2015 0419   AST 14* 01/16/2015 0457   ALT 8* 01/16/2015 0457   ALKPHOS 54 01/16/2015 0457   BILITOT 0.5 01/16/2015 0457   PROT 5.6* 01/16/2015 0457   ALBUMIN 2.7* 01/16/2015 0457    Physical Exam: Vital Signs: BP 107/64 mmHg  Pulse 128  Temp(Src) 97.5 F (36.4 C) (Oral)  Resp 25  Ht 5' 7"  (1.702 m)  Wt 60.601 kg (133 lb 9.6 oz)  BMI 20.92 kg/m2  SpO2 95% SpO2: SpO2: 95 % O2 Device: O2 Device: Not Delivered O2 Flow Rate: O2 Flow Rate (L/min): 3.5 L/min Intake/output summary:  Intake/Output Summary (Last 24 hours) at 01/19/15 1103 Last data filed at 01/19/15 0600  Gross per 24 hour  Intake      0 ml  Output   1000 ml  Net  -1000 ml   LBM: Last BM Date: 01/18/15 Baseline Weight: Weight: 58.968 kg (130 lb) Most recent weight: Weight: 60.601 kg (133 lb 9.6 oz)  Exam Findings:  General: Frail elderly man, alert, and in no acute distress Resp: No increased work of breathing noted. Productive cough with yellow sputum Cardiac: Tachy, irrg Pscyh: A/O to person, place and situation. Able to participate in discussion but defers frequently to his children. Short term memory deficits preent         Palliative Performance Scale: 50%              Additional Data Reviewed: Recent Labs     01/17/15  0328  01/19/15  0419  WBC   --   5.4  HGB   --   11.5*  PLT    --   279  NA  134*  134*  BUN  12  8  CREATININE  0.75  0.82     Time In: 1530 Time Out: 1640 Time Total: 70 min Greater than 50%  of this time was spent counseling and coordinating care related to the above assessment and plan.  Signed by: Dory Horn, NP  Dory Horn, NP  01/19/2015, 8:12 AM  Please contact Palliative Medicine Team phone at [phone] for questions and concerns.

## 2015-01-19 NOTE — Progress Notes (Signed)
Subjective: Patient had multiple RVR episodes overnight. It is unclear whether he had chest pain or SOB at that time. He was maintained on Amiodarone drip.  This morning, he reports improved cough and denies fever or chills. It is now productive of white sputum.  He denies chest pain or discomfort, but reports some shortness of breath when he woke up this morning.  He is oriented to person only and cannot remember why he is here.       Objective: Vital signs in last 24 hours: Filed Vitals:   01/19/15 0215 01/19/15 0330 01/19/15 0345 01/19/15 0423  BP: 98/63  107/64   Pulse: 100 132 128   Temp:    97.5 F (36.4 C)  TempSrc:    Oral  Resp: 27  25   Height:      Weight:    133 lb 9.6 oz (60.601 kg)  SpO2: 93%  95%    Weight change: 2 lb (0.907 kg)  Intake/Output Summary (Last 24 hours) at 01/19/15 0756 Last data filed at 01/19/15 0600  Gross per 24 hour  Intake      0 ml  Output   1000 ml  Net  -1000 ml   Physical Exam  Constitutional:  Frail, cachectic, chronically ill appearing male lying in bed in NAD  HENT:  Head: Normocephalic and atraumatic.  Eyes: EOM are normal.  Neck: No tracheal deviation present.  Cardiovascular: Normal rate, regular rhythm and normal heart sounds.   Pulmonary/Chest: Effort normal.  Diffuse rhonchorous breath sounds.  Decreased breath sounds in left base, stable compared to yesterday.  Abdominal: Soft. He exhibits no distension. There is no tenderness. There is no rebound and no guarding.  Musculoskeletal: He exhibits no edema.  Neurological: He is alert.  Skin: Skin is warm and dry. No rash noted.    Lab Results: Basic Metabolic Panel:  Recent Labs Lab 01/17/15 0328 01/17/15 0930 01/19/15 0419  NA 134*  --  134*  K 3.5  --  4.1  CL 99*  --  95*  CO2 28  --  29  GLUCOSE 106*  --  114*  BUN 12  --  8  CREATININE 0.75  --  0.82  CALCIUM 8.2*  --  9.0  MG  --  1.8 1.8  PHOS  --   --  3.3   Liver Function Tests:  Recent  Labs Lab 01/16/15 0457  AST 14*  ALT 8*  ALKPHOS 54  BILITOT 0.5  PROT 5.6*  ALBUMIN 2.7*   No results for input(s): LIPASE, AMYLASE in the last 168 hours. No results for input(s): AMMONIA in the last 168 hours. CBC:  Recent Labs Lab 01/15/15 0959 01/19/15 0419  WBC 6.0 5.4  HGB 11.2* 11.5*  HCT 32.9* 34.5*  MCV 94.5 96.1  PLT 292 279   Cardiac Enzymes: No results for input(s): CKTOTAL, CKMB, CKMBINDEX, TROPONINI in the last 168 hours. BNP: No results for input(s): PROBNP in the last 168 hours. D-Dimer: No results for input(s): DDIMER in the last 168 hours. CBG:  Recent Labs Lab 01/16/15 2120  GLUCAP 115*   Hemoglobin A1C: No results for input(s): HGBA1C in the last 168 hours. Fasting Lipid Panel: No results for input(s): CHOL, HDL, LDLCALC, TRIG, CHOLHDL, LDLDIRECT in the last 168 hours. Thyroid Function Tests: No results for input(s): TSH, T4TOTAL, FREET4, T3FREE, THYROIDAB in the last 168 hours. Coagulation:  Recent Labs Lab 01/13/15 0339  LABPROT 15.5*  INR 1.22   Anemia  Panel: No results for input(s): VITAMINB12, FOLATE, FERRITIN, TIBC, IRON, RETICCTPCT in the last 168 hours. Urine Drug Screen: Drugs of Abuse  No results found for: LABOPIA, COCAINSCRNUR, LABBENZ, AMPHETMU, THCU, LABBARB  Alcohol Level: No results for input(s): ETH in the last 168 hours. Urinalysis: No results for input(s): COLORURINE, LABSPEC, PHURINE, GLUCOSEU, HGBUR, BILIRUBINUR, KETONESUR, PROTEINUR, UROBILINOGEN, NITRITE, LEUKOCYTESUR in the last 168 hours.  Invalid input(s): APPERANCEUR Misc. Labs:   Micro Results: Recent Results (from the past 240 hour(s))  Culture, blood (routine x 2)     Status: None (Preliminary result)   Collection Time: 01/15/15  4:29 PM  Result Value Ref Range Status   Specimen Description BLOOD RIGHT ARM  Final   Special Requests BOTTLES DRAWN AEROBIC AND ANAEROBIC 5CC  Final   Culture NO GROWTH 3 DAYS  Final   Report Status PENDING   Incomplete  Culture, blood (routine x 2)     Status: None (Preliminary result)   Collection Time: 01/15/15  4:38 PM  Result Value Ref Range Status   Specimen Description BLOOD LEFT HAND  Final   Special Requests BOTTLES DRAWN AEROBIC AND ANAEROBIC 3CC  Final   Culture NO GROWTH 3 DAYS  Final   Report Status PENDING  Incomplete  MRSA PCR Screening     Status: Abnormal   Collection Time: 01/16/15  8:21 AM  Result Value Ref Range Status   MRSA by PCR POSITIVE (A) NEGATIVE Final    Comment:        The GeneXpert MRSA Assay (FDA approved for NASAL specimens only), is one component of a comprehensive MRSA colonization surveillance program. It is not intended to diagnose MRSA infection nor to guide or monitor treatment for MRSA infections. RESULT CALLED TO, READ BACK BY AND VERIFIED WITH: J. LAUER RN 11:00 01/16/15 ([email or web address])   Culture, expectorated sputum-assessment     Status: None   Collection Time: 01/18/15  6:37 AM  Result Value Ref Range Status   Specimen Description SPUTUM  Final   Special Requests Normal  Final   Sputum evaluation   Final    MICROSCOPIC FINDINGS SUGGEST THAT THIS SPECIMEN IS NOT REPRESENTATIVE OF LOWER RESPIRATORY SECRETIONS. PLEASE RECOLLECT. Gram Stain Report Called to,Read Back By and Verified With: V CUMMINGS,RN AT 6160 01/18/15 BY L BENFIELD    Report Status 01/18/2015 FINAL  Final  Culture, expectorated sputum-assessment     Status: None   Collection Time: 01/18/15 10:29 PM  Result Value Ref Range Status   Specimen Description SPUTUM  Final   Special Requests NONE  Final   Sputum evaluation THIS SPECIMEN IS ACCEPTABLE FOR SPUTUM CULTURE  Final   Report Status 01/18/2015 FINAL  Final   Studies/Results: No results found. Medications: I have reviewed the patient's current medications. Scheduled Meds: . amiodarone  200 mg Oral BID  . amoxicillin-clavulanate  1 tablet Oral BID  . Chlorhexidine Gluconate Cloth  6 each Topical Q0600  . [START ON  01/20/2015] digoxin  0.0625 mg Oral Daily  . doxycycline  100 mg Oral BID  . enoxaparin (LOVENOX) injection  40 mg Subcutaneous Q24H  . feeding supplement (ENSURE ENLIVE)  237 mL Oral TID BM  . LORazepam  0.25 mg Oral BID  . multivitamin with minerals  1 tablet Oral Daily  . mupirocin ointment  1 application Nasal BID  . pantoprazole sodium  40 mg Oral Daily  . potassium chloride  20 mEq Oral Daily   Continuous Infusions: . amiodarone 30 mg/hr (01/18/15 2338)  PRN Meds:.acetaminophen, diclofenac sodium, promethazine, RESOURCE THICKENUP CLEAR, senna-docusate Assessment/Plan: Principal Problem:   Left lower lobe pneumonia Active Problems:   Paroxysmal atrial fibrillation (HCC)   Protein-calorie malnutrition, severe (HCC)   GERD   Dysphagia, oropharyngeal phase   Automatic implantable cardioverter-defibrillator in situ   Ischemic cardiomyopathy   Defibrillator discharge   Lung mass RUL 1.1x 1.2 cm    COPD with emphysema ([place])   Acute delirium   Palliative care encounter  Mr. Fede is an 81 yo male with CAD, ICM s/p ICD placement, COPD, NSCLC s/p radiation, and h/o left femoral fracture, with LLL CAP.  Afib/SVT: Previously rate controlled on Sotalol 160 mg BID, but hypotensive. Cardiology evaluation in ED changed regimen to Amiodarone. Patient now on Amiodarone drip and IV Metop for management of rate.   Patient had recent fall in March; therefore, despite CHADsVASc of 5, patient likely too unsafe to anticoagulate.   - EP following, weighing whether to cardiovert or ablate patient to relieve afib. - Digoxin 0.0625 mg PO daily, per EP (s/p 3 doses 0.25 mg q6hours) - Amiodarone drip continuing for now, per EP '[ ]'$  Palliative care consult for goals of care  LLL CAP: Patient with 1 week increase in yellow/green sputum. Interval increase in LLL opacity over last two days. Patient s/p CTX and doxycycline in ED. CURB65 is 1. Lack of SIRS criteria likely 2/2 patient being elderly and  immunosuppressed by lung cancer and irradiation. He received 2 doses CTX and switched to PO.  CXR shows "marked improvement" in aeration of LLL. - Doxycycline 100 mg BID for 4 days (10/6-10/9) - Augmentin 875 BID for 4 days (10/6-10/9) - Blood culture NG'@d4'$  - Sputum culture pending  Acute Delirium: Patient waxing/waning in orientation.  Some acute delirium unsurprising given patient's age, unfamiliar surroundings, acute illness, and multiple distractions at night.  Will attempt to maximize light during day and distraction at night.  Unfortunately, patient will remain tethered to telemetry and Amiodarone IV until rate and rhythm stabilized. - Ativan PRN - [place] PRN - CTM  ICM s/p ICD placement: Patient received 5 inappropriate shocks for misinterpretation of SVT. Cardiology reprogrammed ICD to only shock for ventricular rates > 200 bpm. - Amio drip  Dysphagia: Dysphagia to some pills and hard solid foods.  - Barium swallow shows mild oral phase dysphagia and moderate pharyngeal phase dysphagia  Malnutrition: Chronically ill appearing male with decreased appetite, weight loss, low body fat. - Ensure TID and Magic Cup TID - PT/OT  FEN/GI: - Dysphagia 2, Nectar thick - Protonix  DVT Ppx: Lovenox  Dispo: Disposition is deferred at this time, awaiting improvement of current medical problems.  Anticipated discharge in approximately 1 day(s).   The patient does have a current PCP Janie Morning, DO) and does need an [facility] hospital follow-up appointment after discharge.  The patient does not have transportation limitations that hinder transportation to clinic appointments.  .Services Needed at time of discharge: Y = Yes, Blank = No PT:   OT:   RN:   Equipment:  bedside commode   Other:     LOS: 2 days   Iline Oven, MD 01/19/2015, 7:56 AM

## 2015-01-19 NOTE — Discharge Instructions (Addendum)
1. Take Amiodarone 200 mg twice daily by mouth until October 24th.  Then take Amiodarone 200 mg once daily.   2. Follow up with Dr. Theda Sers on 10/18 3. Follow up with Dr. Lovena Le on 10/27.

## 2015-01-19 NOTE — Progress Notes (Signed)
Daily Progress Note   Patient Name: George Acosta       Date: 01/19/2015 DOB: 05/21/1928  Age: 81 y.o. MRN#: [id number] Attending Physician: Michel Bickers, MD Primary Care Physician: Janie Morning, DO Admit Date: 01/15/2015  Reason for Consultation/Follow-up: Establishing goals of care  Subjective: Pt is a 81 yo man admitted after feeling unwell, cough, and then his ICD fired 5 times. His ICD has been reprogrammed and is now no longer firing. CXR looks better. Dr. Caryl Comes in this am and presented option of cardioversion. Pt does wish to proceed with this. Multiple family members in the room, including all 4 children . Talked at length about [place], specifically code status. Family feels decision making is up to him , that "he's in charge". I did attempt to explain further what full resuscitative measures looks like including, the risks and benefits, but pt focused on cardioversion procedure and could not process additional information  Interval Events: Improvement in CXR; increased HR into 160's Length of Stay: 2 days  Current Medications: Scheduled Meds:  . amoxicillin-clavulanate  1 tablet Oral BID  . apixaban  2.5 mg Oral BID  . Chlorhexidine Gluconate Cloth  6 each Topical Q0600  . [START ON 01/20/2015] digoxin  0.0625 mg Oral Daily  . doxycycline  100 mg Oral BID  . feeding supplement (ENSURE ENLIVE)  237 mL Oral TID BM  . LORazepam  0.25 mg Oral BID  . multivitamin with minerals  1 tablet Oral Daily  . mupirocin ointment  1 application Nasal BID  . pantoprazole sodium  40 mg Oral Daily  . potassium chloride  20 mEq Oral Daily    Continuous Infusions: . amiodarone 30 mg/hr (01/19/15 1231)    PRN Meds: acetaminophen, diclofenac sodium, promethazine, RESOURCE THICKENUP CLEAR, senna-docusate  Palliative Performance Scale: 50%     Vital Signs: BP 107/64 mmHg  Pulse 128  Temp(Src) 97.5 F (36.4 C) (Oral)  Resp 25  Ht '5\' 7"'$  (1.702 m)  Wt 60.601 kg (133 lb 9.6 oz)  BMI  20.92 kg/m2  SpO2 95% SpO2: SpO2: 95 % O2 Device: O2 Device: Not Delivered O2 Flow Rate: O2 Flow Rate (L/min): 3.5 L/min  Intake/output summary:  Intake/Output Summary (Last 24 hours) at 01/19/15 1356 Last data filed at 01/19/15 0910  Gross per 24 hour  Intake    120 ml  Output   1000 ml  Net   -880 ml   LBM:   Baseline Weight: Weight: 58.968 kg (130 lb) Most recent weight: Weight: 60.601 kg (133 lb 9.6 oz)  Physical Exam: General: Frail elderly man in no acute distress Resp: No work of breathing Cardiac: Tachy and irrg              Additional Data Reviewed: Recent Labs     01/17/15  0328  01/19/15  0419  WBC   --   5.4  HGB   --   11.5*  PLT   --   279  NA  134*  134*  BUN  12  8  CREATININE  0.75  0.82     Problem List:  Patient Active Problem List   Diagnosis Date Noted  . Counseling regarding end of life decision making   . Palliative care encounter   . Acute delirium 01/17/2015  . Left lower lobe pneumonia 01/15/2015  . Cough 01/15/2015  . Protein-calorie malnutrition, severe ([place]) 01/15/2015  . Lung mass RUL 1.1x 1.2 cm  01/16/2014  . COPD with emphysema (  [place]) 01/16/2014  . Defibrillator discharge 03/13/2012  . Ischemic cardiomyopathy   . Paroxysmal atrial fibrillation (HCC)   . Dysphagia, oropharyngeal phase 12/12/2008  . Coronary artery disease 09/24/2008  . GERD 09/24/2008  . BENIGN PROSTATIC HYPERTROPHY, HX OF 09/24/2008  . Automatic implantable cardioverter-defibrillator in situ 09/24/2008  . Hypertensive heart disease   . Old inferior myocardial infarction   . Ventricular tachycardia      Palliative Care Assessment & Plan    Code Status:  Full code  Goals of Care:  Wants to leave ICD activated  Wants cardioversion  Symptom Management:  Anxiety: Cont ativan prn. This appears to be worse at night  Palliative Prophylaxis:  Bowel protocol  Psycho-social/Spiritual:  Desire for further Chaplaincy support:no   Prognosis:  Unable to determine Discharge Planning: Home with [place] was discussed with Family Medicine Loretha Brasil  Thank you for allowing the Palliative Medicine Team to assist in the care of this patient.   Time In: 1030 Time Out: 1145  Total Time 75 min Prolonged Time Billed  no     Greater than 50%  of this time was spent counseling and coordinating care related to the above assessment and plan.   Dory Horn, NP  01/19/2015, 1:56 PM  Please contact Palliative Medicine Team phone at [phone] for questions and concerns.

## 2015-01-20 DIAGNOSIS — R0600 Dyspnea, unspecified: Secondary | ICD-10-CM

## 2015-01-20 LAB — CULTURE, BLOOD (ROUTINE X 2)
CULTURE: NO GROWTH
Culture: NO GROWTH

## 2015-01-20 MED ORDER — AMIODARONE HCL 200 MG PO TABS
200.0000 mg | ORAL_TABLET | Freq: Two times a day (BID) | ORAL | Status: DC
  Administered 2015-01-20 – 2015-01-21 (×3): 200 mg via ORAL
  Filled 2015-01-20 (×3): qty 1

## 2015-01-20 NOTE — Progress Notes (Signed)
Patient ID: [name], male   DOB: [birth year], 81 y.o.   MRN: [id number]  Date: 01/20/2015  Patient name: George Acosta  Medical record number: [id number]  Date of birth: [birth year]   This patient's plan of care was discussed with the house staff. Please see their note for complete details. I concur with their findings. [name] is now back in normal sinus rhythm. We will change his amiodarone to oral form and discontinue digoxin and anticoagulation. His community-acquired pneumonia is improving. He will continue empiric antibiotic therapy for one more day.  Michel Bickers, MD 01/20/2015, 2:55 PM

## 2015-01-20 NOTE — Care Management Important Message (Signed)
Important Message  Patient Details  Name: George Acosta MRN: [id number] Date of Birth: 09-17-1928   Medicare Important Message Given:  Yes-second notification given    Erenest Rasher, RN 01/20/2015, 10:26 AM

## 2015-01-20 NOTE — Progress Notes (Addendum)
Occupational Therapy Treatment Patient Details Name: George Acosta MRN: [id number] DOB: Jul 17, 1928 Today's Date: 01/20/2015    History of present illness George Acosta is an 81 yo male with CAD, ICM s/p ICD placement, COPD, NSCLC s/p radiation, and h/o left femoral fracture March 2016, presenting after being shocked by his ICD 5 times. Pt also reported cough with green sputum and difficulty swallowing. Dx of PNA.   OT comments   HR up to 140s in session. Will continue to follow acutely. Updated d/c recommendation to [place].  Follow Up Recommendations  Home health OT;Supervision/Assistance - 24 hour    Equipment Recommendations  3 in 1 bedside comode    Recommendations for Other Services      Precautions / Restrictions Precautions Precautions: Fall Precaution Comments: Pt fell back in 06/2014 and broke L femur.  Has not fallen since but states he is unsteady at times; watch HR Restrictions Weight Bearing Restrictions: No       Mobility Bed Mobility Overal bed mobility: Needs Assistance Bed Mobility: Sit to Supine       Sit to supine: Supervision   General bed mobility comments: physical assist and cues to scoot HOB.   Transfers Overall transfer level: Needs assistance Equipment used: Rolling walker (2 wheeled) Transfers: Sit to/from Stand Sit to Stand: Min guard              Balance    Pt unsteady when standing-Min guard-Min assist.                                ADL Overall ADL's : Needs assistance/impaired     Grooming: Wash/dry face;Set up;Supervision/safety;Sitting   Upper Body Bathing: Set up;Supervision/ safety;Sitting Upper Body Bathing Details (indicate cue type and reason): washed armpits         Lower Body Dressing: Min guard;Sit to/from stand Lower Body Dressing Details (indicate cue type and reason): donned/doffed underwear Toilet Transfer: Min guard;Ambulation;RW;BSC   Toileting- Clothing Manipulation and Hygiene: Minimal  assistance;Sit to/from stand       Functional mobility during ADLs: Min guard;Rolling walker General ADL Comments: Pt seemed anxious in session. Pt had BM.       Vision                     Perception     Praxis      Cognition  Awake/Alert Behavior During Therapy: Anxious Overall Cognitive Status: Within Functional Limits for tasks assessed                       Extremity/Trunk Assessment               Exercises     Shoulder Instructions       General Comments      Pertinent Vitals/ Pain       Pain Assessment: 0-10 Pain Score:  (7-8) Faces Pain Scale: No hurt Pain Location: IV site, left hip, and stomach Pain Descriptors / Indicators: Aching;Grimacing Pain Intervention(s): Monitored during session;Repositioned   HR up to 140s in session.   Home Living                                          Prior Functioning/Environment              Frequency Min  2X/week     Progress Toward Goals  OT Goals(current goals can now be found in the care plan section)  Progress towards OT goals: Progressing toward goals  Acute Rehab OT Goals Patient Stated Goal: not stated OT Goal Formulation: With patient Time For Goal Achievement: 01/30/15 Potential to Achieve Goals: Good ADL Goals Pt Will Perform Grooming: with set-up;standing (3 tasks) Pt Will Perform Lower Body Bathing: with modified independence;sit to/from stand Pt Will Perform Lower Body Dressing: with modified independence;sit to/from stand Pt Will Perform Toileting - Clothing Manipulation and hygiene: with set-up;with supervision;sit to/from stand Additional ADL Goal #1: Pt will toilet with 3:1 over commode with mod I.  Plan Discharge plan needs to be updated    Co-evaluation                 End of Session Equipment Utilized During Treatment: Gait belt;Rolling walker   Activity Tolerance Patient limited by fatigue   Patient Left in bed;with call  bell/phone within reach;with bed alarm set;with family/visitor present   Nurse Communication Other (comment) (HR)        Time: 1020-1040 (some time spent on BSC having BM) OT Time Calculation (min): 20 min  Charges: OT General Charges $OT Visit: 1 Procedure OT Treatments $Self Care/Home Management : 8-22 mins  Benito Mccreedy OTR/L [phone] 01/20/2015, 11:54 AM

## 2015-01-20 NOTE — Progress Notes (Signed)
Patient and patient's family reported that he felt as if his ICD fired. Family states they saw him jump. EP notified.

## 2015-01-20 NOTE — Progress Notes (Signed)
SUBJECTIVE: The patient is doing well today.  At this time, he denies chest pain, shortness of breath, or any new concerns.  Marland Kitchen amoxicillin-clavulanate  1 tablet Oral BID  . apixaban  2.5 mg Oral BID  . Chlorhexidine Gluconate Cloth  6 each Topical Q0600  . digoxin  0.0625 mg Oral Daily  . doxycycline  100 mg Oral BID  . feeding supplement (ENSURE ENLIVE)  237 mL Oral TID BM  . LORazepam  0.25 mg Oral BID  . multivitamin with minerals  1 tablet Oral Daily  . mupirocin ointment  1 application Nasal BID  . pantoprazole sodium  40 mg Oral Daily  . potassium chloride  20 mEq Oral Daily   . amiodarone 30 mg/hr (01/20/15 0114)    OBJECTIVE: Physical Exam: Filed Vitals:   01/19/15 1959 01/20/15 0329 01/20/15 0441 01/20/15 0850  BP: 98/72 119/56 103/60 97/53  Pulse: 118 77 77 65  Temp: 97.9 F (36.6 C)  98.5 F (36.9 C) 97.3 F (36.3 C)  TempSrc: Oral  Oral Oral  Resp:  24  20  Height:      Weight:   130 lb 9.6 oz (59.24 kg)   SpO2: 93% 96% 96% 91%   No intake or output data in the 24 hours ending 01/20/15 1104  Telemetry reveals sinus rhythm  GEN- The patient is well appearing, cachetic alert and oriented x 3 today.   Head- normocephalic, atraumatic Eyes-  Sclera clear, conjunctiva pink Ears- hearing intact   Neck- supple, no JVP   Lungs- Clear to ausculation bilaterally, normal work of breathing Heart- Regular rate and rhythm, no murmurs, rubs or gallops, PMI not laterally displaced GI- soft, NT, ND, + BS Extremities- no clubbing, cyanosis, or edema Skin- no rash or lesion Psych- euthymic mood, full affect Neuro- strength and sensation are intact  LABS: Basic Metabolic Panel:  Recent Labs  01/19/15 0419  NA 134*  K 4.1  CL 95*  CO2 29  GLUCOSE 114*  BUN 8  CREATININE 0.82  CALCIUM 9.0  MG 1.8  PHOS 3.3   Liver Function Tests: No results for input(s): AST, ALT, ALKPHOS, BILITOT, PROT, ALBUMIN in the last 72 hours. No results for input(s): LIPASE,  AMYLASE in the last 72 hours. CBC:  Recent Labs  01/19/15 0419  WBC 5.4  HGB 11.5*  HCT 34.5*  MCV 96.1  PLT 279   Cardiac Enzymes: No results for input(s): CKTOTAL, CKMB, CKMBINDEX, TROPONINI in the last 72 hours. BNP: Invalid input(s): POCBNP D-Dimer: No results for input(s): DDIMER in the last 72 hours. Hemoglobin A1C: No results for input(s): HGBA1C in the last 72 hours. Fasting Lipid Panel: No results for input(s): CHOL, HDL, LDLCALC, TRIG, CHOLHDL, LDLDIRECT in the last 72 hours. Thyroid Function Tests: No results for input(s): TSH, T4TOTAL, T3FREE, THYROIDAB in the last 72 hours.  Invalid input(s): FREET3 Anemia Panel: No results for input(s): VITAMINB12, FOLATE, FERRITIN, TIBC, IRON, RETICCTPCT in the last 72 hours.  RADIOLOGY: Dg Chest 2 View  01/19/2015   CLINICAL DATA:  Shortness of breath. Followup left base lung disease.  EXAM: CHEST  2 VIEW  COMPARISON:  01/15/2015  FINDINGS: Pacemaker/AICD remains in place, but not visibly changed. The right lung remains clear. There is marked improvement in aeration of the left lower lobe. No worsening or new findings.  IMPRESSION: Marked improvement in aeration of the left lower lobe since 4 days ago.   Electronically Signed   By: Jan Fireman.D.  On: 01/19/2015 09:03   Dg Chest 2 View  01/15/2015   CLINICAL DATA:  81 year old male with dizziness, AICD discharge today. Productive cough. Initial encounter.  EXAM: CHEST  2 VIEW  COMPARISON:  01/13/2015 and earlier.  FINDINGS: Seated upright AP and lateral views of the chest. Stable left chest cardiac AICD. Stable cardiac size and mediastinal contours. No pneumothorax or pulmonary edema. Worsening confluence of opacity at the left lower lobe. Possible small pleural effusion. Ventilation elsewhere is stable. Nodular changes in the right apex again noted. Osteopenia. Lower thoracic compression fracture appears stable. Stable cholecystectomy clips.  IMPRESSION: 1. Increasing opacity at  the left lung base suspicious for pneumonia but might reflect worsening atelectasis. Possible small left pleural effusion. 2. Otherwise stable chest   Electronically Signed   By: Genevie Ann M.D.   On: 01/15/2015 10:45   Dg Chest 2 View  01/13/2015   CLINICAL DATA:  Defibrillator activation while lying in bed. History of CHF and myocardial infarction.  EXAM: CHEST  2 VIEW  COMPARISON:  Chest radiograph January 09, 2015 and CT chest December 12, 2014.  FINDINGS: Cardiomediastinal silhouette is unchanged. Mild bronchitic changes, improved aeration LEFT lung base with strandy densities. No pleural effusion or focal consolidative the patient. Small nodule projects in the LEFT lung base, more conspicuous than prior CT. Fibronodular scarring RIGHT upper lobe. LEFT cardiac defibrillator in situ. No pneumothorax. Surgical clip projects in RIGHT lung apex. Patient is osteopenic. Surgical clips in the included right abdomen compatible with cholecystectomy.  IMPRESSION: No active cardiopulmonary disease.  LEFT lung base atelectasis/scarring in a background of bronchitic changes/COPD.  Increasing conspicuity LEFT lower lobe nodule. Please see CT chest December 12, 2014.   Electronically Signed   By: Elon Alas M.D.   On: 01/13/2015 04:31   Dg Swallowing Func-speech Pathology  01/16/2015    Objective Swallowing Evaluation:    Patient Details  Name: Aydeen Blume Montijo MRN: [id number] Date of Birth: 1929/01/17  Today's Date: 01/16/2015 Time: SLP Start Time (ACUTE ONLY): 1030-SLP Stop Time (ACUTE ONLY): 1100 SLP Time Calculation (min) (ACUTE ONLY): 30 min  Past Medical History:  Past Medical History  Diagnosis Date  . Coronary atherosclerosis of unspecified type of vessel, native or graft    . Unspecified essential hypertension   . Benign prostatic hypertrophy   . Peptic ulcer disease   . Diverticulosis   . GERD (gastroesophageal reflux disease)   . Ischemic cardiomyopathy   . Congestive heart failure, unspecified   .  Arrhythmia     paroxysmal afib  . Ventricular tachycardia ([place])     A.   monomorphic VT  B. 04/2011 s/p generator change - [employer] ICD  [phone]  . MI (myocardial infarction) ([place]) 1985    "big MI after I got to hospital"  . Acute myocardial infarction, unspecified site, episode of care  unspecified 1985    "on way to hospital"  . Pneumonia 1980's  . Anxiety   . Pancreatitis 1980's  . Paroxysmal atrial fibrillation (HCC)   . AICD (automatic cardioverter/defibrillator) present     [employer]  . Presence of permanent cardiac pacemaker   . COPD (chronic obstructive pulmonary disease) ([place])   . SOB (shortness of breath)     not recently  . Arthritis   . Lung mass   . Radiation 2/2,2/4,2/8,2/10,05/24/14    Right upper lobe 60 Gy in 5 fractions   Past Surgical History:  Past Surgical History  Procedure Laterality Date  .  Icd  2001; 2007; 04/28/11    generator change; complete replacement; lead replacement  . Laparotomy  11/2005    for small bowel obstruction,  . Cardiac catheterization  11/23/2005  . Cardiac defibrillator placement  1996  . Cholecystectomy  1980's  . Cataract extraction  2012    left eye  . Video bronchoscopy with endobronchial navigation Right 02/27/2014    Procedure: VIDEO BRONCHOSCOPY WITH ENDOBRONCHIAL NAVIGATION,insertion  Feducial marker right upper lobe lung;  Surgeon: Collene Gobble, MD;   Location: [place];  Service: Thoracic;  Laterality: Right;  . Implantable cardioverter defibrillator (icd) generator change N/A  04/28/2011    Procedure: ICD GENERATOR CHANGE;  Surgeon: Evans Lance, MD;   Location: [facility] CATH LAB;  Service: Cardiovascular;  Laterality: N/A;  . Lead revision N/A 04/28/2011    Procedure: LEAD REVISION;  Surgeon: Evans Lance, MD;  Location: [facility]  CATH LAB;  Service: Cardiovascular;  Laterality: N/A;   HPI:  Other Pertinent Information: Mr. Burgoon is an 81 year old with coronary  artery disease, chronic systolic heart failure and arrhythmias. He has  been  implantable defibrillator. He recently completed radiation therapy  for right upper lobe lung cancer. He has had a poor appetite and some  difficulty swallowing pills and solid food. He's had a 30 pound  unintentional weight loss recently. He has had some dizziness with  repeated shocks.  He mentioned that he had developed an increased  productive cough over the past week. He has had some shivering but no  documented fever. He denies any shortness of breath. His cough is  productive of yellow-green sputum with a sour taste. His chest x-ray shows  a new left lower lobe infiltrate.   No Data Recorded  Assessment / Plan / Recommendation CHL IP CLINICAL IMPRESSIONS 01/16/2015  Therapy Diagnosis Mild oral phase dysphagia;Moderate pharyngeal phase  dysphagia  Clinical Impression Pt demosntrates a moderate dysphagia with slow but  adequate oral function due to missing dentition. Oropharyngeal phase is  impaired and characterized by weakness across the hyolaryngeal mechanism  resulting in penetration of thin liquids during the swallow and  penetration of nectar and thin after the swallow due to residuals.  Sensation is intermittent, but pt is able to clear most penetrate with  cues for throat clearing. Bolus transit and airway protection is much  improved with nectar thick liquids and soft solids when pt completes a  chin tuck and swallows twice. Pt continues to report globus when residuals  cleared, likely due to appearance of stasis with with distal esopahgus (no  radiologist present to confirm). Pt is resistant to teaching and is  unlikely to follow recommendations after d/c home. Requested f/u with home  health SLP to help pt and family implement diet recommendations and  strategies if possible.       CHL IP TREATMENT RECOMMENDATION 01/16/2015  Treatment Recommendations Therapy as outlined in treatment plan below     CHL IP DIET RECOMMENDATION 01/16/2015  SLP Diet Recommendations Dysphagia 2 (Fine chop);Nectar  Liquid  Administration via (None)  Medication Administration Crushed with puree  Compensations Chin tuck;Slow rate;Small sips/bites;Multiple dry swallows  after each bite/sip;Clear throat intermittently  Postural Changes and/or Swallow Maneuvers (None)     CHL IP OTHER RECOMMENDATIONS 01/16/2015  Recommended Consults (None)  Oral Care Recommendations Oral care BID  Other Recommendations Order thickener from pharmacy     No flowsheet data found.   No flowsheet data found.   Pertinent Vitals/Pain NA  SLP Swallow Goals No flowsheet data found.  No flowsheet data found.    CHL IP REASON FOR REFERRAL 01/16/2015  Reason for Referral Objectively evaluate swallowing function     CHL IP ORAL PHASE 01/16/2015  Lips (None)  Tongue (None)  Mucous membranes (None)  Nutritional status (None)  Other (None)  Oxygen therapy (None)  Oral Phase WFL  Oral - Pudding Teaspoon (None)  Oral - Pudding Cup (None)  Oral - Honey Teaspoon (None)  Oral - Honey Cup (None)  Oral - Honey Syringe (None)  Oral - Nectar Teaspoon (None)  Oral - Nectar Cup (None)  Oral - Nectar Straw (None)  Oral - Nectar Syringe (None)  Oral - Ice Chips (None)  Oral - Thin Teaspoon (None)  Oral - Thin Cup (None)  Oral - Thin Straw (None)  Oral - Thin Syringe (None)  Oral - Puree (None)  Oral - Mechanical Soft (None)  Oral - Regular (None)  Oral - Multi-consistency (None)  Oral - Pill (None)  Oral Phase - Comment (None)      CHL IP PHARYNGEAL PHASE 01/16/2015  Pharyngeal Phase Impaired  Pharyngeal - Pudding Teaspoon (None)  Penetration/Aspiration details (pudding teaspoon) (None)  Pharyngeal - Pudding Cup (None)  Penetration/Aspiration details (pudding cup) (None)  Pharyngeal - Honey Teaspoon (None)  Penetration/Aspiration details (honey teaspoon) (None)  Pharyngeal - Honey Cup (None)  Penetration/Aspiration details (honey cup) (None)  Pharyngeal - Honey Syringe (None)  Penetration/Aspiration details (honey syringe) (None)  Pharyngeal - Nectar Teaspoon (None)   Penetration/Aspiration details (nectar teaspoon) (None)  Pharyngeal - Nectar Cup (None)  Penetration/Aspiration details (nectar cup) (None)  Pharyngeal - Nectar Straw (None)  Penetration/Aspiration details (nectar straw) (None)  Pharyngeal - Nectar Syringe (None)  Penetration/Aspiration details (nectar syringe) (None)  Pharyngeal - Ice Chips (None)  Penetration/Aspiration details (ice chips) (None)  Pharyngeal - Thin Teaspoon (None)  Penetration/Aspiration details (thin teaspoon) (None)  Pharyngeal - Thin Cup (None)  Penetration/Aspiration details (thin cup) (None)  Pharyngeal - Thin Straw (None)  Penetration/Aspiration details (thin straw) (None)  Pharyngeal - Thin Syringe (None)  Penetration/Aspiration details (thin syringe') (None)  Pharyngeal - Puree (None)  Penetration/Aspiration details (puree) (None)  Pharyngeal - Mechanical Soft (None)  Penetration/Aspiration details (mechanical soft) (None)  Pharyngeal - Regular (None)  Penetration/Aspiration details (regular) (None)  Pharyngeal - Multi-consistency (None)  Penetration/Aspiration details (multi-consistency) (None)  Pharyngeal - Pill (None)  Penetration/Aspiration details (pill) (None)  Pharyngeal Comment (None)      No flowsheet data found.  CHL IP GO 01/16/2015  Functional Assessment Tool Used clinical judgement  Functional Limitations Swallowing  Swallow Current Status [phone]) CK  Swallow Goal Status (L8921) [place] Discharge Status [phone]) (None)  Motor Speech Current Status [phone]) (None)  Motor Speech Goal Status [phone]) (None)  Motor Speech Goal Status (U3149) (None)  Spoken Language Comprehension Current Status (F0263) (None)  Spoken Language Comprehension Goal Status (Z8588) (None)  Spoken Language Comprehension Discharge Status [phone]) (None)  Spoken Language Expression Current Status [phone]) (None)  Spoken Language Expression Goal Status [phone]) (None)  Spoken Language Expression Discharge Status [phone]) (None)  Attention Current Status (S9628)  (None)  Attention Goal Status (Z6629) (None)  Attention Discharge Status [phone]) (None)  Memory Current Status (Y5035) (None)  Memory Goal Status (W6568) (None)  Memory Discharge Status (L2751) (None)  Voice Current Status (Z0017) (None)  Voice Goal Status (C9449) (None)  Voice Discharge Status (Q7591) (None)  Other Speech-Language Pathology Functional Limitation [phone]) (None)  Other Speech-Language Pathology Functional Limitation Goal  Status [phone])  (None)  Other Speech-Language Pathology Functional Limitation Discharge Status  [phone]) (None)          Herbie Baltimore, MA CCC-SLP [phone]  DeBlois, Katherene Ponto 01/16/2015, 12:24 PM     ASSESSMENT AND PLAN:  Principal Problem:   Left lower lobe pneumonia Active Problems:   GERD   Dysphagia, oropharyngeal phase   Automatic implantable cardioverter-defibrillator in situ   Ischemic cardiomyopathy   Paroxysmal atrial fibrillation (HCC)   Defibrillator discharge   Lung mass RUL 1.1x 1.2 cm    COPD with emphysema ([place])   Protein-calorie malnutrition, severe ([place])   Acute delirium   Palliative care encounter   Counseling regarding end of life decision making   Baldwin Jamaica,    01/20/2015 11:04 AM   The patient has reverted to sinus rhythm. Discussions with primary care service and Dr. Lovena Le regarding anticoagulation results and uniform recommendation to discontinue it. I have noted to the family that there is some ambivalence about this.  With his reversion to sinus rhythm we will discontinue the digoxin which was intended for rate control. I spoken with internal medicine teaching service regarding this and they have appropriately considered discontinuing intravenous amiodarone and placing him on by mouth; we've agreed to 200 mg twice daily which would be reasonable for couple of weeks with subsequent down titration to 200 mg a day.

## 2015-01-20 NOTE — Progress Notes (Signed)
We were called to revisit the patient who felt he had been shocked by his device.  Dr. Caryl Comes interrogated his device and showed no therapy delivered and no events noted.  The patient and family were reassured.  Tommye Standard, PA-C 01/20/2015

## 2015-01-20 NOTE — Care Management Note (Addendum)
Case Management Note  Patient Details  Name: George Acosta MRN: [id number] Date of Birth: 1928/07/09  Subjective/Objective:     Afib, PNA               Action/Plan: NCM spoke to pt and gave permission to speak to dtr, [name]. Pt states he has RW and 3n1 bedside commode at home. Dtr, [name] would like to check his oxygen level at night. States she is at home with pt all day, he is never left alone. Reports pt used [facility] in the past and requesting agency if pt is dc home needing HH. Will contact attending to see if pt would benefit for overnight pulse oximetry tonight to determine if oxygen is needed while he sleeps.   Expected Discharge Date:  01/21/2015               Expected Discharge Plan:  [place]  In-House Referral:     Discharge planning Services  CM Consult  Post Acute Care Choice:  Home Health Choice offered to:  Adult Children  :     Status of Service:  In process, will continue to follow  Medicare Important Message Given:  Yes-second notification given Date Medicare IM Given:    Medicare IM give by:    Date Additional Medicare IM Given:    Additional Medicare Important Message give by:     If discussed at [place] of Stay Meetings, dates discussed:    Additional Comments:  Erenest Rasher, RN 01/20/2015, 4:37 PM   01/20/2015 1700 NCM contacted attending and updated on families concerns. Order received for cont overnight pulse oximetry. Contacted resp therapy and they will set pt up this evening. Jonnie Finner RN CCM Case Mgmt phone [phone]

## 2015-01-20 NOTE — Progress Notes (Signed)
Daily Progress Note   Patient Name: George Acosta       Date: 01/20/2015 DOB: [birth year]  Age: 81 y.o. MRN#: [id number] Attending Physician: Michel Bickers, MD Primary Care Physician: Janie Morning, DO Admit Date: 01/15/2015  Reason for Consultation/Follow-up: Establishing goals of care  Subjective:  F/U visit at bedside with patient and his son and daughter  Patient has converted to sinus rhythm.   Per cardiology; plan is to  discontinue the digoxin which was intended for rate control, discontinue intravenous amiodarone and change to oral,  to 200 mg twice daily which would be reasonable for couple of weeks with subsequent down titration to 200 mg a day  Continued conversation with patient and his family regarding limitations of medical interventions and concept of mortality and importance of discussion of and documentation of advanced directives    Length of Stay: 3 days  Current Medications: Scheduled Meds:  . amiodarone  200 mg Oral BID  . amoxicillin-clavulanate  1 tablet Oral BID  . Chlorhexidine Gluconate Cloth  6 each Topical Q0600  . doxycycline  100 mg Oral BID  . feeding supplement (ENSURE ENLIVE)  237 mL Oral TID BM  . LORazepam  0.25 mg Oral BID  . multivitamin with minerals  1 tablet Oral Daily  . mupirocin ointment  1 application Nasal BID  . pantoprazole sodium  40 mg Oral Daily  . potassium chloride  20 mEq Oral Daily    Continuous Infusions:    PRN Meds: acetaminophen, diclofenac sodium, promethazine, RESOURCE THICKENUP CLEAR, senna-docusate  Palliative Performance Scale: 50%     Vital Signs: BP 97/53 mmHg  Pulse 65  Temp(Src) 97.3 F (36.3 C) (Oral)  Resp 20  Ht '5\' 7"'$  (1.702 m)  Wt 59.24 kg (130 lb 9.6 oz)  BMI 20.45 kg/m2  SpO2 91% SpO2: SpO2: 91 % O2 Device: O2 Device: Not Delivered O2 Flow Rate: O2 Flow Rate (L/min): 3.5 L/min  Intake/output summary:   Intake/Output Summary (Last 24 hours) at 01/20/15 1436 Last data filed at  01/20/15 1300  Gross per 24 hour  Intake    220 ml  Output    100 ml  Net    120 ml   LBM:   Baseline Weight: Weight: 58.968 kg (130 lb) Most recent weight: Weight: 59.24 kg (130 lb 9.6 oz)  Physical Exam: General: Frail elderly man in no acute distress Resp: No work of breathing Cardiac: RRR          Additional Data Reviewed: Recent Labs     01/19/15  0419  WBC  5.4  HGB  11.5*  PLT  279  NA  134*  BUN  8  CREATININE  0.82     Problem List:  Patient Active Problem List   Diagnosis Date Noted  . Counseling regarding end of life decision making   . Palliative care encounter   . Acute delirium 01/17/2015  . Left lower lobe pneumonia 01/15/2015  . Cough 01/15/2015  . Protein-calorie malnutrition, severe ([place]) 01/15/2015  . Lung mass RUL 1.1x 1.2 cm  01/16/2014  . COPD with emphysema ([place]) 01/16/2014  . Defibrillator discharge 03/13/2012  . Ischemic cardiomyopathy   . Paroxysmal atrial fibrillation (HCC)   . Dysphagia, oropharyngeal phase 12/12/2008  . Coronary artery disease 09/24/2008  . GERD 09/24/2008  . BENIGN PROSTATIC HYPERTROPHY, HX OF 09/24/2008  . Automatic implantable cardioverter-defibrillator in situ 09/24/2008  . Hypertensive heart disease   . Old inferior myocardial infarction   .  Ventricular tachycardia      Palliative Care Assessment & Plan    Code Status:  Full code- encouraged to consider DNR/DNI status knowing its limitations in similar patients.   Goals of Care:   Patient is hopeful for continued quality life at home with home health services  Symptom Management:   Anxiety: Cont ativan prn. This appears to be worse at night  Fatigue/Dyspnea:  Discussed consideration of home oxygen, CMRN to have assessment completed before discharge home   Discharge Planning: Home with [place] was discussed with Family Medicine Loretha Brasil  Thank you for allowing the Palliative Medicine Team to assist in the care of this  patient.   Time In: 1000 Time Out: 1025  Total Time 25 min Prolonged Time Billed  no     Greater than 50%  of this time was spent counseling and coordinating care related to the above assessment and plan.   Knox Royalty, NP  01/20/2015, 2:36 PM  Please contact Palliative Medicine Team phone at [phone] for questions and concerns.

## 2015-01-20 NOTE — Progress Notes (Signed)
Physical Therapy Treatment Patient Details Name: George Acosta MRN: [id number] DOB: 06/21/1928 Today's Date: 01/20/2015    History of Present Illness George Acosta is an 81 yo male with CAD, ICM s/p ICD placement, COPD, NSCLC s/p radiation, and h/o left femoral fracture March 2016, presenting after being shocked by his ICD 5 times. Pt also reported cough with green sputum and difficulty swallowing. Dx of PNA.    PT Comments    Pt mobilizes better than he lets on.  L LE pain is mildly limiting to mobility.  Should be safe with family's assist.  Pt does not want PT follow up.    Follow Up Recommendations  No PT follow up     Equipment Recommendations  None recommended by PT    Recommendations for Other Services       Precautions / Restrictions Precautions Precautions: Fall Precaution Comments: Pt fell back in 06/2014 and broke L femur.  Has not fallen since but states he is unsteady at times; watch HR    Mobility  Bed Mobility Overal bed mobility: Needs Assistance Bed Mobility: Sit to Supine;Supine to Sit     Supine to sit: Supervision Sit to supine: Supervision   General bed mobility comments: used rail, but no assist  Transfers Overall transfer level: Needs assistance Equipment used: Rolling walker (2 wheeled) Transfers: Sit to/from Stand Sit to Stand: Min guard         General transfer comment: cued for hand placement  Ambulation/Gait Ambulation/Gait assistance: Min guard Ambulation Distance (Feet): 30 Feet Assistive device: Rolling walker (2 wheeled) Gait Pattern/deviations: Step-through pattern;Decreased step length - left;Decreased stance time - left     General Gait Details: EHR in the 120's with gait.  Antalgic on the L LE using RW   Stairs            Wheelchair Mobility    Modified Rankin (Stroke Patients Only)       Balance Overall balance assessment: Needs assistance   Sitting balance-Leahy Scale: Good       Standing  balance-Leahy Scale: Fair Standing balance comment: pt stood briefly without the RW                    Cognition Arousal/Alertness: Awake/alert Behavior During Therapy: Anxious Overall Cognitive Status: Within Functional Limits for tasks assessed                      Exercises      General Comments General comments (skin integrity, edema, etc.): pt states he can't walk in one sentence then states he can do it at home the next.  Does not want rehab at [facility] or home.      Pertinent Vitals/Pain Pain Assessment: 0-10 Pain Score: 3  Pain Location: Left leg/knee Pain Descriptors / Indicators: Aching;Grimacing Pain Intervention(s): Limited activity within patient's tolerance;Monitored during session    Home Living                      Prior Function            PT Goals (current goals can now be found in the care plan section) Acute Rehab PT Goals Patient Stated Goal: not stated PT Goal Formulation: With patient/family Time For Goal Achievement: 01/30/15 Potential to Achieve Goals: Fair Progress towards PT goals: Progressing toward goals    Frequency  Min 3X/week    PT Plan Current plan remains appropriate    Co-evaluation  End of Session   Activity Tolerance: Patient limited by pain Patient left: in bed;with call bell/phone within reach     Time: 7615-1834 PT Time Calculation (min) (ACUTE ONLY): 20 min  Charges:  $Gait Training: 8-22 mins                    G Codes:  Functional Assessment Tool Used:     Gavan Nordby, Tessie Fass 01/20/2015, 5:11 PM 01/20/2015  Donnella Sham, PT [phone]  (pager)

## 2015-01-20 NOTE — Progress Notes (Signed)
Speech Language Pathology Treatment: Dysphagia  Patient Details Name: George Acosta MRN: [id number] DOB: December 29, 1928 Today's Date: 01/20/2015 Time: 8657-8469 SLP Time Calculation (min) (ACUTE ONLY): 15 min  Assessment / Plan / Recommendation Clinical Impression  During dysphagia treatment, SLP provided education and moderate verbal and visual cues to follow the following compensations: alternate bites and sips, tuck chin, swallow 2x, and sit up for 30-60 minutes after meal. SLP faded cues during the session, at the end of the session pt was tucking his chin independently. SLP will f/u with patient and family prior to d/c to educate aspiration precautions and how to thicken drinks.    HPI Other Pertinent Information: George Acosta is an 81 year old with coronary artery disease, chronic systolic heart failure and arrhythmias. He has been implantable defibrillator. He recently completed radiation therapy for right upper lobe lung cancer. He has had a poor appetite and some difficulty swallowing pills and solid food. He's had a 30 pound unintentional weight loss recently. He has had some dizziness with repeated shocks.  He mentioned that he had developed an increased productive cough over the past week. He has had some shivering but no documented fever. He denies any shortness of breath. His cough is productive of yellow-green sputum with a sour taste. His chest x-ray shows a new left lower lobe infiltrate.    Pertinent Vitals Pain Assessment: Faces Faces Pain Scale: No hurt  SLP Plan  Continue with current plan of care    Recommendations Diet recommendations: Dysphagia 2 (fine chop);Nectar-thick liquid Liquids provided via: Straw Medication Administration: Crushed with puree Supervision: Patient able to self feed;Full supervision/cueing for compensatory strategies Compensations: Chin tuck;Slow rate;Small sips/bites;Multiple dry swallows after each bite/sip;Clear throat intermittently Postural  Changes and/or Swallow Maneuvers: Seated upright 90 degrees;Upright 30-60 min after meal              Oral Care Recommendations: Oral care BID Follow up Recommendations: Home health SLP Plan: Continue with current plan of care    George Acosta, George Acosta 01/20/2015, 9:53 AM

## 2015-01-20 NOTE — Progress Notes (Signed)
Telemetry reviewed and no evidence of ICD firing noted/looked all the way back to 0310.  I also called and spoke to [place] who states only junk and artifact noted on telemetry at the time pt states this happened. Pt remains on IV Amio for AFIB.  Will continue to monitor. Jessie Foot, RN

## 2015-01-20 NOTE — Progress Notes (Signed)
Called in to patients room. Pt states My Defibrillator just fired, call placed to central Telemetry for confirmation. VS obtained. Pt A&A at present time.  VS [id number]% 119/56

## 2015-01-20 NOTE — Progress Notes (Signed)
Subjective: Patient feeling better this morning. He denies SOB or CP.  He says his cough is much better and he is not coughing anything up.  He is generally oriented to person, place, and time.  He has no complaints today.  He is in normal sinus rhythm.   Objective: Vital signs in last 24 hours: Filed Vitals:   01/19/15 1959 01/20/15 0329 01/20/15 0441 01/20/15 0850  BP: 98/72 119/56 103/60 97/53  Pulse: 118 77 77 65  Temp: 97.9 F (36.6 C)  98.5 F (36.9 C) 97.3 F (36.3 C)  TempSrc: Oral  Oral Oral  Resp:  24  20  Height:      Weight:   130 lb 9.6 oz (59.24 kg)   SpO2: 93% 96% 96% 91%   Weight change: -3 lb (-1.361 kg) No intake or output data in the 24 hours ending 01/20/15 1039 Physical Exam  Constitutional: He is oriented to person, place, and time.  Frail, cachectic, chronically ill appearing male lying in bed in NAD  HENT:  Head: Normocephalic and atraumatic.  Eyes: EOM are normal.  Neck: No tracheal deviation present.  Cardiovascular: Normal rate, regular rhythm and normal heart sounds.   Pulmonary/Chest: Effort normal.  Increased air movement throughout left lung.  No wheezes or rhonchi appreciated.  Abdominal: Soft. He exhibits no distension. There is no tenderness. There is no rebound and no guarding.  Musculoskeletal: He exhibits no edema.  Neurological: He is alert and oriented to person, place, and time.  Skin: Skin is warm and dry. No rash noted.    Lab Results: Basic Metabolic Panel:  Recent Labs Lab 01/17/15 0328 01/17/15 0930 01/19/15 0419  NA 134*  --  134*  K 3.5  --  4.1  CL 99*  --  95*  CO2 28  --  29  GLUCOSE 106*  --  114*  BUN 12  --  8  CREATININE 0.75  --  0.82  CALCIUM 8.2*  --  9.0  MG  --  1.8 1.8  PHOS  --   --  3.3   Liver Function Tests:  Recent Labs Lab 01/16/15 0457  AST 14*  ALT 8*  ALKPHOS 54  BILITOT 0.5  PROT 5.6*  ALBUMIN 2.7*   No results for input(s): LIPASE, AMYLASE in the last 168 hours. No results  for input(s): AMMONIA in the last 168 hours. CBC:  Recent Labs Lab 01/15/15 0959 01/19/15 0419  WBC 6.0 5.4  HGB 11.2* 11.5*  HCT 32.9* 34.5*  MCV 94.5 96.1  PLT 292 279   Cardiac Enzymes: No results for input(s): CKTOTAL, CKMB, CKMBINDEX, TROPONINI in the last 168 hours. BNP: No results for input(s): PROBNP in the last 168 hours. D-Dimer: No results for input(s): DDIMER in the last 168 hours. CBG:  Recent Labs Lab 01/16/15 2120  GLUCAP 115*   Hemoglobin A1C: No results for input(s): HGBA1C in the last 168 hours. Fasting Lipid Panel: No results for input(s): CHOL, HDL, LDLCALC, TRIG, CHOLHDL, LDLDIRECT in the last 168 hours. Thyroid Function Tests: No results for input(s): TSH, T4TOTAL, FREET4, T3FREE, THYROIDAB in the last 168 hours. Coagulation: No results for input(s): LABPROT, INR in the last 168 hours. Anemia Panel: No results for input(s): VITAMINB12, FOLATE, FERRITIN, TIBC, IRON, RETICCTPCT in the last 168 hours. Urine Drug Screen: Drugs of Abuse  No results found for: LABOPIA, COCAINSCRNUR, LABBENZ, AMPHETMU, THCU, LABBARB  Alcohol Level: No results for input(s): ETH in the last 168 hours. Urinalysis: No  results for input(s): COLORURINE, LABSPEC, PHURINE, GLUCOSEU, HGBUR, BILIRUBINUR, KETONESUR, PROTEINUR, UROBILINOGEN, NITRITE, LEUKOCYTESUR in the last 168 hours.  Invalid input(s): APPERANCEUR Misc. Labs:   Micro Results: Recent Results (from the past 240 hour(s))  Culture, blood (routine x 2)     Status: None (Preliminary result)   Collection Time: 01/15/15  4:29 PM  Result Value Ref Range Status   Specimen Description BLOOD RIGHT ARM  Final   Special Requests BOTTLES DRAWN AEROBIC AND ANAEROBIC 5CC  Final   Culture NO GROWTH 4 DAYS  Final   Report Status PENDING  Incomplete  Culture, blood (routine x 2)     Status: None (Preliminary result)   Collection Time: 01/15/15  4:38 PM  Result Value Ref Range Status   Specimen Description BLOOD LEFT HAND   Final   Special Requests BOTTLES DRAWN AEROBIC AND ANAEROBIC 3CC  Final   Culture NO GROWTH 4 DAYS  Final   Report Status PENDING  Incomplete  MRSA PCR Screening     Status: Abnormal   Collection Time: 01/16/15  8:21 AM  Result Value Ref Range Status   MRSA by PCR POSITIVE (A) NEGATIVE Final    Comment:        The GeneXpert MRSA Assay (FDA approved for NASAL specimens only), is one component of a comprehensive MRSA colonization surveillance program. It is not intended to diagnose MRSA infection nor to guide or monitor treatment for MRSA infections. RESULT CALLED TO, READ BACK BY AND VERIFIED WITH: J. LAUER RN 11:00 01/16/15 ([email or web address])   Culture, expectorated sputum-assessment     Status: None   Collection Time: 01/18/15  6:37 AM  Result Value Ref Range Status   Specimen Description SPUTUM  Final   Special Requests Normal  Final   Sputum evaluation   Final    MICROSCOPIC FINDINGS SUGGEST THAT THIS SPECIMEN IS NOT REPRESENTATIVE OF LOWER RESPIRATORY SECRETIONS. PLEASE RECOLLECT. Gram Stain Report Called to,Read Back By and Verified With: V CUMMINGS,RN AT 2979 01/18/15 BY L BENFIELD    Report Status 01/18/2015 FINAL  Final  Culture, expectorated sputum-assessment     Status: None   Collection Time: 01/18/15 10:29 PM  Result Value Ref Range Status   Specimen Description SPUTUM  Final   Special Requests NONE  Final   Sputum evaluation THIS SPECIMEN IS ACCEPTABLE FOR SPUTUM CULTURE  Final   Report Status 01/18/2015 FINAL  Final  Culture, respiratory (NON-Expectorated)     Status: None (Preliminary result)   Collection Time: 01/18/15 10:29 PM  Result Value Ref Range Status   Specimen Description SPUTUM  Final   Special Requests NONE  Final   Gram Stain   Final    MODERATE WBC PRESENT, PREDOMINANTLY PMN FEW SQUAMOUS EPITHELIAL CELLS PRESENT MODERATE YEAST FEW GRAM POSITIVE COCCI IN CLUSTERS Performed at [facility]    Culture   Final    Culture reincubated for better  growth Performed at [facility]    Report Status PENDING  Incomplete   Studies/Results: Dg Chest 2 View  01/19/2015   CLINICAL DATA:  Shortness of breath. Followup left base lung disease.  EXAM: CHEST  2 VIEW  COMPARISON:  01/15/2015  FINDINGS: Pacemaker/AICD remains in place, but not visibly changed. The right lung remains clear. There is marked improvement in aeration of the left lower lobe. No worsening or new findings.  IMPRESSION: Marked improvement in aeration of the left lower lobe since 4 days ago.   Electronically Signed   By: Elta Guadeloupe  Shogry M.D.   On: 01/19/2015 09:03   Medications: I have reviewed the patient's current medications. Scheduled Meds: . amoxicillin-clavulanate  1 tablet Oral BID  . apixaban  2.5 mg Oral BID  . Chlorhexidine Gluconate Cloth  6 each Topical Q0600  . digoxin  0.0625 mg Oral Daily  . doxycycline  100 mg Oral BID  . feeding supplement (ENSURE ENLIVE)  237 mL Oral TID BM  . LORazepam  0.25 mg Oral BID  . multivitamin with minerals  1 tablet Oral Daily  . mupirocin ointment  1 application Nasal BID  . pantoprazole sodium  40 mg Oral Daily  . potassium chloride  20 mEq Oral Daily   Continuous Infusions: . amiodarone 30 mg/hr (01/20/15 0114)   PRN Meds:.acetaminophen, diclofenac sodium, promethazine, RESOURCE THICKENUP CLEAR, senna-docusate Assessment/Plan: Principal Problem:   Left lower lobe pneumonia Active Problems:   Paroxysmal atrial fibrillation (HCC)   Protein-calorie malnutrition, severe (HCC)   GERD   Dysphagia, oropharyngeal phase   Automatic implantable cardioverter-defibrillator in situ   Ischemic cardiomyopathy   Defibrillator discharge   Lung mass RUL 1.1x 1.2 cm    COPD with emphysema (HCC)   Acute delirium   Palliative care encounter   Counseling regarding end of life decision making  Mr. Sciara is an 81 yo male with CAD, ICM s/p ICD placement, COPD, NSCLC s/p radiation, and h/o left femoral fracture, with LLL CAP and  afib with RVR.  Afib/SVT, improving: Previously rate controlled on Sotalol 160 mg BID, but hypotensive. Cardiology evaluation in ED changed regimen to Amiodarone. Patient required Amiodarone drip and digoxin for management of rate.  He spontaneously transitioned back to normal sinus.  Will switch to PO amio today and ensure patient remains in sinus rhythm.  Digoxin was previously added for rate control in addition to Amiodarone. Will stop Digoxin to help prevent drug-drug interactions.  Patient had recent fall in March; therefore, despite CHADsVASc of 5, patient likely too unsafe to anticoagulate.  Patient started on Eliquis for possible cardioversion, but will stop it today as he is back in sinus rhythm.   - EP following - STOP Digoxin 0.0625 mg PO daily, per EP (s/p 3 doses 0.25 mg q6hours) - STOP Amiodarone drip - STOP Eliquis - Amiodarone 200 mg PO BID for 2 weeks, then 200 mg daily  LLL CAP: Patient with 1 week increase in yellow/green sputum. Interval increase in LLL opacity over last two days. Patient s/p CTX and doxycycline in ED. CURB65 is 1. Lack of SIRS criteria likely 2/2 patient being elderly and immunosuppressed by lung cancer and irradiation. He received 2 doses CTX and switched to PO.  CXR shows "marked improvement" in aeration of LLL. - Doxycycline 100 mg BID for 7 days (10/6-10/11) - Augmentin 875 BID for 7 days (10/6-10/11) - Blood culture NG'@d4'$  - Sputum culture shows few gram Gram+ cocci in clusters with few squams and moderate yeast.  Acute Delirium: Patient waxing/waning in orientation.  Some acute delirium unsurprising given patient's age, unfamiliar surroundings, acute illness, and multiple distractions at night.  Will attempt to maximize light during day and distraction at night.  Unfortunately, patient will remain tethered to telemetry and Amiodarone IV until rate and rhythm stabilized. - Ativan PRN - Mittens PRN - CTM  ICM s/p ICD placement: Patient received 5  inappropriate shocks for misinterpretation of SVT. Cardiology reprogrammed ICD to only shock for ventricular rates > 200 bpm. - Amio drip  Dysphagia: Dysphagia to some pills and hard solid foods.  -  Barium swallow shows mild oral phase dysphagia and moderate pharyngeal phase dysphagia  Malnutrition: Chronically ill appearing male with decreased appetite, weight loss, low body fat. - Ensure TID and Magic Cup TID - PT/OT  FEN/GI: - Dysphagia 2, Nectar thick - Protonix  DVT Ppx: Lovenox  Dispo: Disposition is deferred at this time, awaiting improvement of current medical problems.  Anticipated discharge in approximately 1 day(s).   The patient does have a current PCP Janie Morning, DO) and does need an [facility] hospital follow-up appointment after discharge.  The patient does not have transportation limitations that hinder transportation to clinic appointments.  .Services Needed at time of discharge: Y = Yes, Blank = No PT:   OT:   RN:   Equipment:  bedside commode   Other:     LOS: 3 days   Iline Oven, MD 01/20/2015, 10:39 AM

## 2015-01-21 DIAGNOSIS — J189 Pneumonia, unspecified organism: Secondary | ICD-10-CM | POA: Insufficient documentation

## 2015-01-21 DIAGNOSIS — Z7189 Other specified counseling: Secondary | ICD-10-CM | POA: Insufficient documentation

## 2015-01-21 DIAGNOSIS — R0600 Dyspnea, unspecified: Secondary | ICD-10-CM | POA: Insufficient documentation

## 2015-01-21 MED ORDER — AMIODARONE HCL 200 MG PO TABS: 200.0000 mg | ORAL_TABLET | Freq: Two times a day (BID) | ORAL | Status: DC

## 2015-01-21 MED ORDER — ENSURE ENLIVE PO LIQD: 237.0000 mL | Freq: Three times a day (TID) | ORAL | Status: AC

## 2015-01-21 MED ORDER — PANTOPRAZOLE SODIUM 40 MG PO PACK: 40.0000 mg | PACK | Freq: Every day | ORAL | Status: AC

## 2015-01-21 NOTE — Progress Notes (Signed)
Patient ID: George Acosta, male   DOB: January 31, 1929, 81 y.o.   MRN: [id number]  Date: 01/21/2015  Patient name: George Acosta  Medical record number: [id number]  Date of birth: Aug 23, 1928   This patient's plan of care was discussed with the house staff. Please see their note for complete details. I concur with their findings. [name] is feeling much better and eager to go home. His heart rate remains in the normal range but he has been alternating between normal sinus rhythm in atrial fibrillation through the night. If he is able to safely ambulate he will be ready for discharge today. His cough has improved and his sputum is now thin and clear. Today is the last day of empiric antibiotic therapy for community-acquired pneumonia.  Michel Bickers, MD 01/21/2015, 11:49 AM

## 2015-01-21 NOTE — Progress Notes (Signed)
Pt states he felt his ICD "kick" again.  Pt voiced no other complaints VS stable (see flow sheet) telemetry reviewed and did not see any evidence of arrhythmia or ICD firing.Pt is NSR with HR 77.  Reassurance and emotional support provided.  Will continue to monitor. Jessie Foot, RN

## 2015-01-21 NOTE — Care Management Note (Signed)
Case Management Note  Patient Details  Name: George Acosta MRN: [id number] Date of Birth: July 25, 1928  Subjective/Objective:   Pt admitted for A Fib. Pt is from home with daughter and son. Plan is to return home with [facility] services via [facility].                  Action/Plan: CM did make referral. Information faxed and SOC to begin within 24-48 hrs post d/c. No further needs from CM at this time.  Expected Discharge Date:                  Expected Discharge Plan:  [place]  In-House Referral:     Discharge planning Services  CM Consult  Post Acute Care Choice:  Home Health Choice offered to:  Adult Children  DME Arranged:  N/A DME Agency:  NA  HH Arranged:  RN, Speech Therapy HH Agency:  [facility]  Status of Service:  Completed, signed off  Medicare Important Message Given:  Yes-second notification given Date Medicare IM Given:    Medicare IM give by:    Date Additional Medicare IM Given:    Additional Medicare Important Message give by:     If discussed at [place] of Stay Meetings, dates discussed:    Additional Comments:  Bethena Roys, RN 01/21/2015, 2:37 PM

## 2015-01-21 NOTE — Progress Notes (Signed)
Patient was ambulated a short distance and HR went up to 110. Patient tolerated well. He did complain of knee pain afterwards but refused tylenol.

## 2015-01-21 NOTE — Progress Notes (Signed)
Patient ID: George Acosta, male   DOB: 14-Apr-1928, 81 y.o.   MRN: [id number]    Patient Name: George Acosta Date of Encounter: 01/21/2015     Principal Problem:   Paroxysmal atrial fibrillation (HCC) Active Problems:   GERD   Dysphagia, oropharyngeal phase   Automatic implantable cardioverter-defibrillator in situ   Ischemic cardiomyopathy   Defibrillator discharge   Lung mass RUL 1.1x 1.2 cm    COPD with emphysema (HCC)   Left lower lobe pneumonia   Protein-calorie malnutrition, severe ([place])   Acute delirium   Palliative care encounter   Counseling regarding end of life decision making   Dyspnea   DNR (do not resuscitate) discussion    SUBJECTIVE  No chest pain or sob or palpitations. Note phantom shocks. He wants to go home.  CURRENT MEDS . amiodarone  200 mg Oral BID  . amoxicillin-clavulanate  1 tablet Oral BID  . Chlorhexidine Gluconate Cloth  6 each Topical Q0600  . doxycycline  100 mg Oral BID  . feeding supplement (ENSURE ENLIVE)  237 mL Oral TID BM  . LORazepam  0.25 mg Oral BID  . multivitamin with minerals  1 tablet Oral Daily  . mupirocin ointment  1 application Nasal BID  . pantoprazole sodium  40 mg Oral Daily  . potassium chloride  20 mEq Oral Daily    OBJECTIVE  Filed Vitals:   01/20/15 2340 01/21/15 0330 01/21/15 0432 01/21/15 0730  BP: 109/66 95/66 113/63 104/49  Pulse: 94 76 77 72  Temp: 98.4 F (36.9 C) 97.4 F (36.3 C)  98.5 F (36.9 C)  TempSrc: Oral Oral  Oral  Resp: '20 18  17  '$ Height:      Weight:  130 lb 8.2 oz (59.2 kg)    SpO2: 96% 96% 95% 95%    Intake/Output Summary (Last 24 hours) at 01/21/15 0845 Last data filed at 01/21/15 7425  Gross per 24 hour  Intake  633.6 ml  Output    300 ml  Net  333.6 ml   Filed Weights   01/19/15 0423 01/20/15 0441 01/21/15 0330  Weight: 133 lb 9.6 oz (60.601 kg) 130 lb 9.6 oz (59.24 kg) 130 lb 8.2 oz (59.2 kg)    PHYSICAL EXAM  General: Pleasant, chronically ill appearing,  NAD. Neuro: Alert and oriented X 3. Moves all extremities spontaneously. Psych: Normal affect. HEENT:  Normal  Neck: Supple without bruits or JVD. Lungs:  Resp regular and unlabored, CTA. Heart: RRR no s3, s4, or murmurs. Abdomen: Soft, non-tender, non-distended, BS + x 4.  Extremities: No clubbing, cyanosis or edema. DP/PT/Radials 2+ and equal bilaterally.  Accessory Clinical Findings  CBC  Recent Labs  01/19/15 0419  WBC 5.4  HGB 11.5*  HCT 34.5*  MCV 96.1  PLT 956   Basic Metabolic Panel  Recent Labs  01/19/15 0419  NA 134*  K 4.1  CL 95*  CO2 29  GLUCOSE 114*  BUN 8  CREATININE 0.82  CALCIUM 9.0  MG 1.8  PHOS 3.3   Liver Function Tests No results for input(s): AST, ALT, ALKPHOS, BILITOT, PROT, ALBUMIN in the last 72 hours. No results for input(s): LIPASE, AMYLASE in the last 72 hours. Cardiac Enzymes No results for input(s): CKTOTAL, CKMB, CKMBINDEX, TROPONINI in the last 72 hours. BNP Invalid input(s): POCBNP D-Dimer No results for input(s): DDIMER in the last 72 hours. Hemoglobin A1C No results for input(s): HGBA1C in the last 72 hours. Fasting Lipid Panel No results for  input(s): CHOL, HDL, LDLCALC, TRIG, CHOLHDL, LDLDIRECT in the last 72 hours. Thyroid Function Tests No results for input(s): TSH, T4TOTAL, T3FREE, THYROIDAB in the last 72 hours.  Invalid input(s): FREET3  TELE  NSR now atrial fib  Radiology/Studies  Dg Chest 2 View  01/19/2015   CLINICAL DATA:  Shortness of breath. Followup left base lung disease.  EXAM: CHEST  2 VIEW  COMPARISON:  01/15/2015  FINDINGS: Pacemaker/AICD remains in place, but not visibly changed. The right lung remains clear. There is marked improvement in aeration of the left lower lobe. No worsening or new findings.  IMPRESSION: Marked improvement in aeration of the left lower lobe since 4 days ago.   Electronically Signed   By: Nelson Chimes M.D.   On: 01/19/2015 09:03   Dg Chest 2 View  01/15/2015   CLINICAL  DATA:  81 year old male with dizziness, AICD discharge today. Productive cough. Initial encounter.  EXAM: CHEST  2 VIEW  COMPARISON:  01/13/2015 and earlier.  FINDINGS: Seated upright AP and lateral views of the chest. Stable left chest cardiac AICD. Stable cardiac size and mediastinal contours. No pneumothorax or pulmonary edema. Worsening confluence of opacity at the left lower lobe. Possible small pleural effusion. Ventilation elsewhere is stable. Nodular changes in the right apex again noted. Osteopenia. Lower thoracic compression fracture appears stable. Stable cholecystectomy clips.  IMPRESSION: 1. Increasing opacity at the left lung base suspicious for pneumonia but might reflect worsening atelectasis. Possible small left pleural effusion. 2. Otherwise stable chest   Electronically Signed   By: Genevie Ann M.D.   On: 01/15/2015 10:45   Dg Chest 2 View  01/13/2015   CLINICAL DATA:  Defibrillator activation while lying in bed. History of CHF and myocardial infarction.  EXAM: CHEST  2 VIEW  COMPARISON:  Chest radiograph January 09, 2015 and CT chest December 12, 2014.  FINDINGS: Cardiomediastinal silhouette is unchanged. Mild bronchitic changes, improved aeration LEFT lung base with strandy densities. No pleural effusion or focal consolidative the patient. Small nodule projects in the LEFT lung base, more conspicuous than prior CT. Fibronodular scarring RIGHT upper lobe. LEFT cardiac defibrillator in situ. No pneumothorax. Surgical clip projects in RIGHT lung apex. Patient is osteopenic. Surgical clips in the included right abdomen compatible with cholecystectomy.  IMPRESSION: No active cardiopulmonary disease.  LEFT lung base atelectasis/scarring in a background of bronchitic changes/COPD.  Increasing conspicuity LEFT lower lobe nodule. Please see CT chest December 12, 2014.   Electronically Signed   By: Elon Alas M.D.   On: 01/13/2015 04:31   Dg Swallowing Func-speech Pathology  01/16/2015     Objective Swallowing Evaluation:    Patient Details  Name: George Acosta MRN: [id number] Date of Birth: [birth year]  Today's Date: 01/16/2015 Time: SLP Start Time (ACUTE ONLY): 1030-SLP Stop Time (ACUTE ONLY): 1100 SLP Time Calculation (min) (ACUTE ONLY): 30 min  Past Medical History:  Past Medical History  Diagnosis Date  . Coronary atherosclerosis of unspecified type of vessel, native or graft    . Unspecified essential hypertension   . Benign prostatic hypertrophy   . Peptic ulcer disease   . Diverticulosis   . GERD (gastroesophageal reflux disease)   . Ischemic cardiomyopathy   . Congestive heart failure, unspecified   . Arrhythmia     paroxysmal afib  . Ventricular tachycardia ([place])     A.   monomorphic VT  B. 04/2011 s/p generator change - [employer] ICD  [phone]  . MI (myocardial infarction) (  [place]) 1985    "big MI after I got to hospital"  . Acute myocardial infarction, unspecified site, episode of care  unspecified 1985    "on way to hospital"  . Pneumonia 1980's  . Anxiety   . Pancreatitis 1980's  . Paroxysmal atrial fibrillation (HCC)   . AICD (automatic cardioverter/defibrillator) present     [employer]  . Presence of permanent cardiac pacemaker   . COPD (chronic obstructive pulmonary disease) ([place])   . SOB (shortness of breath)     not recently  . Arthritis   . Lung mass   . Radiation 2/2,2/4,2/8,2/10,05/24/14    Right upper lobe 60 Gy in 5 fractions   Past Surgical History:  Past Surgical History  Procedure Laterality Date  . Icd  2001; 2007; 04/28/11    generator change; complete replacement; lead replacement  . Laparotomy  11/2005    for small bowel obstruction,  . Cardiac catheterization  11/23/2005  . Cardiac defibrillator placement  1996  . Cholecystectomy  1980's  . Cataract extraction  2012    left eye  . Video bronchoscopy with endobronchial navigation Right 02/27/2014    Procedure: VIDEO BRONCHOSCOPY WITH ENDOBRONCHIAL NAVIGATION,insertion  Feducial marker right upper lobe lung;   Surgeon: Collene Gobble, MD;   Location: [place] [facility];  Service: Thoracic;  Laterality: Right;  . Implantable cardioverter defibrillator (icd) generator change N/A  04/28/2011    Procedure: ICD GENERATOR CHANGE;  Surgeon: Evans Lance, MD;   Location: [facility] CATH LAB;  Service: Cardiovascular;  Laterality: N/A;  . Lead revision N/A 04/28/2011    Procedure: LEAD REVISION;  Surgeon: Evans Lance, MD;  Location: [facility]  CATH LAB;  Service: Cardiovascular;  Laterality: N/A;   HPI:  Other Pertinent Information: Mr. Fluhr is an 81 year old with coronary  artery disease, chronic systolic heart failure and arrhythmias. He has  been implantable defibrillator. He recently completed radiation therapy  for right upper lobe lung cancer. He has had a poor appetite and some  difficulty swallowing pills and solid food. He's had a 30 pound  unintentional weight loss recently. He has had some dizziness with  repeated shocks.  He mentioned that he had developed an increased  productive cough over the past week. He has had some shivering but no  documented fever. He denies any shortness of breath. His cough is  productive of yellow-green sputum with a sour taste. His chest x-ray shows  a new left lower lobe infiltrate.   No Data Recorded  Assessment / Plan / Recommendation CHL IP CLINICAL IMPRESSIONS 01/16/2015  Therapy Diagnosis Mild oral phase dysphagia;Moderate pharyngeal phase  dysphagia  Clinical Impression Pt demosntrates a moderate dysphagia with slow but  adequate oral function due to missing dentition. Oropharyngeal phase is  impaired and characterized by weakness across the hyolaryngeal mechanism  resulting in penetration of thin liquids during the swallow and  penetration of nectar and thin after the swallow due to residuals.  Sensation is intermittent, but pt is able to clear most penetrate with  cues for throat clearing. Bolus transit and airway protection is much  improved with nectar thick liquids and soft solids when pt completes a   chin tuck and swallows twice. Pt continues to report globus when residuals  cleared, likely due to appearance of stasis with with distal esopahgus (no  radiologist present to confirm). Pt is resistant to teaching and is  unlikely to follow recommendations after d/c home. Requested f/u with home  health SLP to  help pt and family implement diet recommendations and  strategies if possible.       CHL IP TREATMENT RECOMMENDATION 01/16/2015  Treatment Recommendations Therapy as outlined in treatment plan below     CHL IP DIET RECOMMENDATION 01/16/2015  SLP Diet Recommendations Dysphagia 2 (Fine chop);Nectar  Liquid Administration via (None)  Medication Administration Crushed with puree  Compensations Chin tuck;Slow rate;Small sips/bites;Multiple dry swallows  after each bite/sip;Clear throat intermittently  Postural Changes and/or Swallow Maneuvers (None)     CHL IP OTHER RECOMMENDATIONS 01/16/2015  Recommended Consults (None)  Oral Care Recommendations Oral care BID  Other Recommendations Order thickener from pharmacy     No flowsheet data found.   No flowsheet data found.   Pertinent Vitals/Pain NA    SLP Swallow Goals No flowsheet data found.  No flowsheet data found.    CHL IP REASON FOR REFERRAL 01/16/2015  Reason for Referral Objectively evaluate swallowing function     CHL IP ORAL PHASE 01/16/2015  Lips (None)  Tongue (None)  Mucous membranes (None)  Nutritional status (None)  Other (None)  Oxygen therapy (None)  Oral Phase WFL  Oral - Pudding Teaspoon (None)  Oral - Pudding Cup (None)  Oral - Honey Teaspoon (None)  Oral - Honey Cup (None)  Oral - Honey Syringe (None)  Oral - Nectar Teaspoon (None)  Oral - Nectar Cup (None)  Oral - Nectar Straw (None)  Oral - Nectar Syringe (None)  Oral - Ice Chips (None)  Oral - Thin Teaspoon (None)  Oral - Thin Cup (None)  Oral - Thin Straw (None)  Oral - Thin Syringe (None)  Oral - Puree (None)  Oral - Mechanical Soft (None)  Oral - Regular (None)  Oral - Multi-consistency (None)   Oral - Pill (None)  Oral Phase - Comment (None)      CHL IP PHARYNGEAL PHASE 01/16/2015  Pharyngeal Phase Impaired  Pharyngeal - Pudding Teaspoon (None)  Penetration/Aspiration details (pudding teaspoon) (None)  Pharyngeal - Pudding Cup (None)  Penetration/Aspiration details (pudding cup) (None)  Pharyngeal - Honey Teaspoon (None)  Penetration/Aspiration details (honey teaspoon) (None)  Pharyngeal - Honey Cup (None)  Penetration/Aspiration details (honey cup) (None)  Pharyngeal - Honey Syringe (None)  Penetration/Aspiration details (honey syringe) (None)  Pharyngeal - Nectar Teaspoon (None)  Penetration/Aspiration details (nectar teaspoon) (None)  Pharyngeal - Nectar Cup (None)  Penetration/Aspiration details (nectar cup) (None)  Pharyngeal - Nectar Straw (None)  Penetration/Aspiration details (nectar straw) (None)  Pharyngeal - Nectar Syringe (None)  Penetration/Aspiration details (nectar syringe) (None)  Pharyngeal - Ice Chips (None)  Penetration/Aspiration details (ice chips) (None)  Pharyngeal - Thin Teaspoon (None)  Penetration/Aspiration details (thin teaspoon) (None)  Pharyngeal - Thin Cup (None)  Penetration/Aspiration details (thin cup) (None)  Pharyngeal - Thin Straw (None)  Penetration/Aspiration details (thin straw) (None)  Pharyngeal - Thin Syringe (None)  Penetration/Aspiration details (thin syringe') (None)  Pharyngeal - Puree (None)  Penetration/Aspiration details (puree) (None)  Pharyngeal - Mechanical Soft (None)  Penetration/Aspiration details (mechanical soft) (None)  Pharyngeal - Regular (None)  Penetration/Aspiration details (regular) (None)  Pharyngeal - Multi-consistency (None)  Penetration/Aspiration details (multi-consistency) (None)  Pharyngeal - Pill (None)  Penetration/Aspiration details (pill) (None)  Pharyngeal Comment (None)      No flowsheet data found.  CHL IP GO 01/16/2015  Functional Assessment Tool Used clinical judgement  Functional Limitations Swallowing  Swallow Current Status  (M7672) CK  Swallow Goal Status (C9470) [place]  Swallow Discharge Status [phone]) (None)  Motor Speech Current Status [phone]) (None)  Motor Speech Goal Status [phone]) (None)  Motor Speech Goal Status [phone]) (None)  Spoken Language Comprehension Current Status [phone]) (None)  Spoken Language Comprehension Goal Status (T1438) (None)  Spoken Language Comprehension Discharge Status [phone]) (None)  Spoken Language Expression Current Status [phone]) (None)  Spoken Language Expression Goal Status [phone]) (None)  Spoken Language Expression Discharge Status [phone]) (None)  Attention Current Status (H4327) (None)  Attention Goal Status (M1470) (None)  Attention Discharge Status [phone]) (None)  Memory Current Status (M7340) (None)  Memory Goal Status (Z7096) (None)  Memory Discharge Status (K3838) (None)  Voice Current Status (F8403) (None)  Voice Goal Status (F5436) (None)  Voice Discharge Status (G6770) (None)  Other Speech-Language Pathology Functional Limitation [phone]) (None)  Other Speech-Language Pathology Functional Limitation Goal Status (C4818)  (None)  Other Speech-Language Pathology Functional Limitation Discharge Status  [phone]) (None)          Herbie Baltimore, MA CCC-SLP [phone]  DeBlois, Katherene Ponto 01/16/2015, 12:24 PM     ASSESSMENT AND PLAN  1. Recurrent and medically refractory atrial tachycardia 2. S/p ICD shock due to #1 3. Lung CA, patient unwilling to consider hospice 4. Pneumonia Rec: at this point he appears stable for discharge. He will continue amio 200 bid and stop sotalol. He will followup in clinic in a couple of weeks.    Claudine Stallings,M.D.  01/21/2015 8:45 AM

## 2015-01-21 NOTE — Progress Notes (Cosign Needed)
Subjective: Patient says he is feeling better today than yesterday, and is just a little tired. He thought that his ICD had fired incorrectly yesterday, but interrogation did not show any arrhythmias or incorrect discharges.  He denies SOB or CP.  He has an occasional cough intermittently productive of white sputum.  He is he oriented x3.  He has no complaints today and believes he is safe to go home, as he lives with his son and daughter.     Objective: Vital signs in last 24 hours: Filed Vitals:   01/20/15 2340 01/21/15 0330 01/21/15 0432 01/21/15 0730  BP: 109/66 95/66 113/63 104/49  Pulse: 94 76 77 72  Temp: 98.4 F (36.9 C) 97.4 F (36.3 C)  98.5 F (36.9 C)  TempSrc: Oral Oral  Oral  Resp: '20 18  17  '$ Height:      Weight:  130 lb 8.2 oz (59.2 kg)    SpO2: 96% 96% 95% 95%   Weight change: -1.4 oz (-0.04 kg)  Intake/Output Summary (Last 24 hours) at 01/21/15 0824 Last data filed at 01/21/15 5009  Gross per 24 hour  Intake  633.6 ml  Output    300 ml  Net  333.6 ml   Physical Exam  Constitutional: He is oriented to person, place, and time.  Frail, cachectic, chronically ill appearing male lying in bed in NAD  HENT:  Head: Normocephalic and atraumatic.  Eyes: EOM are normal.  Neck: No tracheal deviation present.  Cardiovascular: Normal rate and normal heart sounds.   Intermittently jumping in and out of regular and irregularly irregular rhythm.  Pulmonary/Chest: Effort normal.  Increased air movement throughout left lung.  No wheezes or rhonchi appreciated.  Abdominal: Soft. He exhibits no distension. There is no tenderness. There is no rebound and no guarding.  Musculoskeletal: He exhibits no edema.  Neurological: He is alert and oriented to person, place, and time.  Skin: Skin is warm and dry. No rash noted.    Lab Results: Basic Metabolic Panel:  Recent Labs Lab 01/17/15 0328 01/17/15 0930 01/19/15 0419  NA 134*  --  134*  K 3.5  --  4.1  CL 99*  --   95*  CO2 28  --  29  GLUCOSE 106*  --  114*  BUN 12  --  8  CREATININE 0.75  --  0.82  CALCIUM 8.2*  --  9.0  MG  --  1.8 1.8  PHOS  --   --  3.3   Liver Function Tests:  Recent Labs Lab 01/16/15 0457  AST 14*  ALT 8*  ALKPHOS 54  BILITOT 0.5  PROT 5.6*  ALBUMIN 2.7*   No results for input(s): LIPASE, AMYLASE in the last 168 hours. No results for input(s): AMMONIA in the last 168 hours. CBC:  Recent Labs Lab 01/15/15 0959 01/19/15 0419  WBC 6.0 5.4  HGB 11.2* 11.5*  HCT 32.9* 34.5*  MCV 94.5 96.1  PLT 292 279   Cardiac Enzymes: No results for input(s): CKTOTAL, CKMB, CKMBINDEX, TROPONINI in the last 168 hours. BNP: No results for input(s): PROBNP in the last 168 hours. D-Dimer: No results for input(s): DDIMER in the last 168 hours. CBG:  Recent Labs Lab 01/16/15 2120  GLUCAP 115*   Hemoglobin A1C: No results for input(s): HGBA1C in the last 168 hours. Fasting Lipid Panel: No results for input(s): CHOL, HDL, LDLCALC, TRIG, CHOLHDL, LDLDIRECT in the last 168 hours. Thyroid Function Tests: No results for input(s): TSH,  T4TOTAL, FREET4, T3FREE, THYROIDAB in the last 168 hours. Coagulation: No results for input(s): LABPROT, INR in the last 168 hours. Anemia Panel: No results for input(s): VITAMINB12, FOLATE, FERRITIN, TIBC, IRON, RETICCTPCT in the last 168 hours. Urine Drug Screen: Drugs of Abuse  No results found for: LABOPIA, COCAINSCRNUR, LABBENZ, AMPHETMU, THCU, LABBARB  Alcohol Level: No results for input(s): ETH in the last 168 hours. Urinalysis: No results for input(s): COLORURINE, LABSPEC, PHURINE, GLUCOSEU, HGBUR, BILIRUBINUR, KETONESUR, PROTEINUR, UROBILINOGEN, NITRITE, LEUKOCYTESUR in the last 168 hours.  Invalid input(s): APPERANCEUR Misc. Labs:   Micro Results: Recent Results (from the past 240 hour(s))  Culture, blood (routine x 2)     Status: None   Collection Time: 01/15/15  4:29 PM  Result Value Ref Range Status   Specimen  Description BLOOD RIGHT ARM  Final   Special Requests BOTTLES DRAWN AEROBIC AND ANAEROBIC 5CC  Final   Culture NO GROWTH 5 DAYS  Final   Report Status 01/20/2015 FINAL  Final  Culture, blood (routine x 2)     Status: None   Collection Time: 01/15/15  4:38 PM  Result Value Ref Range Status   Specimen Description BLOOD LEFT HAND  Final   Special Requests BOTTLES DRAWN AEROBIC AND ANAEROBIC 3CC  Final   Culture NO GROWTH 5 DAYS  Final   Report Status 01/20/2015 FINAL  Final  MRSA PCR Screening     Status: Abnormal   Collection Time: 01/16/15  8:21 AM  Result Value Ref Range Status   MRSA by PCR POSITIVE (A) NEGATIVE Final    Comment:        The GeneXpert MRSA Assay (FDA approved for NASAL specimens only), is one component of a comprehensive MRSA colonization surveillance program. It is not intended to diagnose MRSA infection nor to guide or monitor treatment for MRSA infections. RESULT CALLED TO, READ BACK BY AND VERIFIED WITH: J. LAUER RN 11:00 01/16/15 ([email or web address])   Culture, expectorated sputum-assessment     Status: None   Collection Time: 01/18/15  6:37 AM  Result Value Ref Range Status   Specimen Description SPUTUM  Final   Special Requests Normal  Final   Sputum evaluation   Final    MICROSCOPIC FINDINGS SUGGEST THAT THIS SPECIMEN IS NOT REPRESENTATIVE OF LOWER RESPIRATORY SECRETIONS. PLEASE RECOLLECT. Gram Stain Report Called to,Read Back By and Verified With: V CUMMINGS,RN AT 6270 01/18/15 BY L BENFIELD    Report Status 01/18/2015 FINAL  Final  Culture, expectorated sputum-assessment     Status: None   Collection Time: 01/18/15 10:29 PM  Result Value Ref Range Status   Specimen Description SPUTUM  Final   Special Requests NONE  Final   Sputum evaluation THIS SPECIMEN IS ACCEPTABLE FOR SPUTUM CULTURE  Final   Report Status 01/18/2015 FINAL  Final  Culture, respiratory (NON-Expectorated)     Status: None (Preliminary result)   Collection Time: 01/18/15 10:29 PM  Result  Value Ref Range Status   Specimen Description SPUTUM  Final   Special Requests NONE  Final   Gram Stain   Final    MODERATE WBC PRESENT, PREDOMINANTLY PMN FEW SQUAMOUS EPITHELIAL CELLS PRESENT MODERATE YEAST FEW GRAM POSITIVE COCCI IN CLUSTERS Performed at [facility]    Culture   Final    Culture reincubated for better growth Performed at [facility]    Report Status PENDING  Incomplete   Studies/Results: No results found. Medications: I have reviewed the patient's current medications. Scheduled Meds: . amiodarone  200 mg Oral BID  . amoxicillin-clavulanate  1 tablet Oral BID  . Chlorhexidine Gluconate Cloth  6 each Topical Q0600  . doxycycline  100 mg Oral BID  . feeding supplement (ENSURE ENLIVE)  237 mL Oral TID BM  . LORazepam  0.25 mg Oral BID  . multivitamin with minerals  1 tablet Oral Daily  . mupirocin ointment  1 application Nasal BID  . pantoprazole sodium  40 mg Oral Daily  . potassium chloride  20 mEq Oral Daily   Continuous Infusions:   PRN Meds:.acetaminophen, diclofenac sodium, promethazine, RESOURCE THICKENUP CLEAR, senna-docusate Assessment/Plan: Principal Problem:   Paroxysmal atrial fibrillation (HCC) Active Problems:   Left lower lobe pneumonia   Protein-calorie malnutrition, severe (HCC)   GERD   Dysphagia, oropharyngeal phase   Automatic implantable cardioverter-defibrillator in situ   Ischemic cardiomyopathy   Defibrillator discharge   Lung mass RUL 1.1x 1.2 cm    COPD with emphysema ([place])   Acute delirium   Palliative care encounter   Counseling regarding end of life decision making   Dyspnea   DNR (do not resuscitate) discussion  Mr. Touhey is an 81 yo male with CAD, ICM s/p ICD placement, COPD, NSCLC s/p radiation, and h/o left femoral fracture, with LLL CAP and afib with RVR.  Afib/SVT, improving: Previously rate controlled on Sotalol 160 mg BID, but hypotensive. Cardiology evaluation in ED changed regimen to  Amiodarone. Patient required Amiodarone drip and digoxin for management of rate.  He spontaneously transitioned back to normal sinus, but has since returned to afib on Amio PO.  He is currently rate controlled on Amio PO.  We will ambulate patient to ensure he does not go back into RVR.  Patient had recent fall in March; therefore, despite CHADsVASc of 5, patient likely too unsafe to anticoagulate. - EP following - Amiodarone 200 mg PO BID for 2 weeks, then 200 mg daily  LLL CAP: Patient with 1 week increase in yellow/green sputum. Interval increase in LLL opacity over last two days. Patient s/p CTX and doxycycline in ED. CURB65 is 1. Lack of SIRS criteria likely 2/2 patient being elderly and immunosuppressed by lung cancer and irradiation. He received 2 doses CTX and switched to PO.  CXR shows "marked improvement" in aeration of LLL. - Doxycycline 100 mg BID for 7 days (10/6-10/11) - Augmentin 875 BID for 7 days (10/6-10/11) - Blood culture NG'@d5'$  - Sputum culture shows few gram Gram+ cocci in clusters with few squams and moderate yeast.  Acute Delirium, resolved: Patient waxing/waning in orientation.  Some acute delirium unsurprising given patient's age, unfamiliar surroundings, acute illness, and multiple distractions at night.  Will attempt to maximize light during day and distraction at night.  Unfortunately, patient will remain tethered to telemetry and Amiodarone IV until rate and rhythm stabilized. - Ativan PRN - Mittens PRN - CTM  ICM s/p ICD placement: Patient received 5 inappropriate shocks for misinterpretation of SVT. Cardiology reprogrammed ICD to only shock for ventricular rates > 200 bpm.  Dysphagia: Dysphagia to some pills and hard solid foods.  - Barium swallow shows mild oral phase dysphagia and moderate pharyngeal phase dysphagia  Malnutrition: Chronically ill appearing male with decreased appetite, weight loss, low body fat. - Ensure TID and Magic Cup TID -  PT/OT  FEN/GI: - Dysphagia 2, Nectar thick - Protonix  DVT Ppx: Lovenox  Dispo: Disposition is deferred at this time, awaiting improvement of current medical problems.  Anticipated discharge in approximately 1 day(s).   The  patient does have a current PCP Janie Morning, DO) and does need an [facility] hospital follow-up appointment after discharge.  The patient does not have transportation limitations that hinder transportation to clinic appointments.  .Services Needed at time of discharge: Y = Yes, Blank = No PT:   OT:   RN:   Equipment:    Other:     LOS: 4 days   Iline Oven, MD 01/21/2015, 8:24 AM

## 2015-01-22 LAB — CULTURE, RESPIRATORY W GRAM STAIN

## 2015-01-22 LAB — CULTURE, RESPIRATORY

## 2015-02-06 ENCOUNTER — Encounter: Payer: Self-pay | Admitting: Internal Medicine

## 2015-02-06 ENCOUNTER — Ambulatory Visit (INDEPENDENT_AMBULATORY_CARE_PROVIDER_SITE_OTHER): Payer: Medicare HMO | Admitting: Internal Medicine

## 2015-02-06 VITALS — BP 130/62 | HR 62 | Ht 69.0 in | Wt 135.6 lb

## 2015-02-06 DIAGNOSIS — I480 Paroxysmal atrial fibrillation: Secondary | ICD-10-CM | POA: Diagnosis not present

## 2015-02-06 DIAGNOSIS — Z95810 Presence of automatic (implantable) cardiac defibrillator: Secondary | ICD-10-CM | POA: Diagnosis not present

## 2015-02-06 DIAGNOSIS — I255 Ischemic cardiomyopathy: Secondary | ICD-10-CM | POA: Diagnosis not present

## 2015-02-06 LAB — CUP PACEART INCLINIC DEVICE CHECK
Date Time Interrogation Session: 20161027040000
HIGH POWER IMPEDANCE MEASURED VALUE: 65 Ohm
HighPow Impedance: 76 Ohm
Implantable Lead Implant Date: 20130116
Implantable Lead Location: 753860
Implantable Lead Model: 180
Implantable Lead Serial Number: 304469
Lead Channel Pacing Threshold Amplitude: 0.9 V
Lead Channel Sensing Intrinsic Amplitude: 12.9 mV
Lead Channel Setting Pacing Pulse Width: 0.4 ms
Lead Channel Setting Sensing Sensitivity: 0.5 mV
MDC IDC LEAD IMPLANT DT: 20010420
MDC IDC LEAD LOCATION: 753859
MDC IDC MSMT LEADCHNL RA IMPEDANCE VALUE: 515 Ohm
MDC IDC MSMT LEADCHNL RA PACING THRESHOLD AMPLITUDE: 1.1 V
MDC IDC MSMT LEADCHNL RA PACING THRESHOLD PULSEWIDTH: 0.4 ms
MDC IDC MSMT LEADCHNL RA SENSING INTR AMPL: 1.3 mV
MDC IDC MSMT LEADCHNL RV IMPEDANCE VALUE: 491 Ohm
MDC IDC MSMT LEADCHNL RV PACING THRESHOLD PULSEWIDTH: 0.4 ms
MDC IDC PG SERIAL: 102353
MDC IDC SET LEADCHNL RA PACING AMPLITUDE: 2 V
MDC IDC SET LEADCHNL RV PACING AMPLITUDE: 2.4 V
MDC IDC STAT BRADY RA PERCENT PACED: 8 %
MDC IDC STAT BRADY RV PERCENT PACED: 1 %

## 2015-02-06 MED ORDER — AMIODARONE HCL 200 MG PO TABS: 200.0000 mg | ORAL_TABLET | Freq: Every day | ORAL | Status: AC

## 2015-02-06 NOTE — Assessment & Plan Note (Signed)
His device is working normally. We discussed turning off all tachy therapies in the hospital. He is not inclined to do this at this time.

## 2015-02-06 NOTE — Patient Instructions (Signed)
Medication Instructions:  Your physician recommends that you continue on your current medications as directed. Please refer to the Current Medication list given to you today.  Labwork: None ordered  Testing/Procedures: None ordered  Follow-Up: Your physician recommends that you schedule a follow-up appointment in: 3-4 months with Dr. Lovena Le.   Any Other Special Instructions Will Be Listed Below (If Applicable).  If you need a refill on your cardiac medications before your next appointment, please call your pharmacy.  Thank you for choosing [facility]!!

## 2015-02-06 NOTE — Assessment & Plan Note (Signed)
He is maintaining NSR on amio. He will continue his current meds. Amio 200 daily

## 2015-02-06 NOTE — Progress Notes (Signed)
HPI [name] returns today for followup. He is a very pleasant 81 year old man with chronic systolic heart failure, and ischemic cardiomyopathy, ventricular tachycardia, status post ICD implantation. The patient denies chest pain. He has chronic class II dyspnea. He has had intermittent heart failure symptoms. He denies peripheral edema.  He notes mild nasal congestion but no fevers or chills. He is recently been diagnosed with lung cancer and has undergone treatment. He was hospitalized several weeks ago with recurrent ICD shocks due to atrial fib with a RVR. His device was reprogrammed to a faster detection rate. He had recurrent atrial arrhythmias and was started on amiodarone. Eventually his atrial arrhythmias settled down. No other complaints.  Allergies  Allergen Reactions  . Sulfonamide Derivatives Hives     Current Outpatient Prescriptions  Medication Sig Dispense Refill  . acetaminophen (TYLENOL) 325 MG tablet Take 650 mg by mouth every 6 (six) hours as needed for mild pain.    Marland Kitchen amiodarone (PACERONE) 200 MG tablet Take 1 tablet (200 mg total) by mouth daily. 30 tablet 11  . CARAFATE 1 GM/10ML suspension Take 10 mLs by mouth 4 (four) times daily.    . feeding supplement, ENSURE ENLIVE, (ENSURE ENLIVE) LIQD Take 237 mLs by mouth 3 (three) times daily between meals. 237 mL 12  . guaiFENesin-codeine (ROBITUSSIN AC) 100-10 MG/5ML syrup Take 5 mLs by mouth 3 (three) times daily as needed for cough.    Marland Kitchen LORazepam (ATIVAN) 0.5 MG tablet Take 0.25 mg by mouth 2 (two) times daily.    . Multiple Vitamin (MULTIVITAMIN WITH MINERALS) TABS Take 1 tablet by mouth daily.    . nitroGLYCERIN (NITROSTAT) 0.4 MG SL tablet Place 0.4 mg under the tongue every 5 (five) minutes as needed for chest pain (chest pain MAX 3 doses).    . pantoprazole sodium (PROTONIX) 40 mg/20 mL PACK Take 20 mLs (40 mg total) by mouth daily. 30 each 0  . potassium chloride SA (K-DUR,KLOR-CON) 20 MEQ tablet Take 20 mEq by mouth  daily.      . ranitidine (ZANTAC) 15 MG/ML syrup Take 20 mLs by mouth at bedtime.     No current facility-administered medications for this visit.     Past Medical History  Diagnosis Date  . Coronary atherosclerosis of unspecified type of vessel, native or graft   . Unspecified essential hypertension   . Benign prostatic hypertrophy   . Peptic ulcer disease   . Diverticulosis   . GERD (gastroesophageal reflux disease)   . Ischemic cardiomyopathy   . Congestive heart failure, unspecified   . Arrhythmia     paroxysmal afib  . Ventricular tachycardia ([place])     A.   monomorphic VT  B. 04/2011 s/p generator change - [employer] ICD  [phone]  . MI (myocardial infarction) ([place]) 1985    "big MI after I got to hospital"  . Acute myocardial infarction, unspecified site, episode of care unspecified 1985    "on way to hospital"  . Pneumonia 1980's  . Anxiety   . Pancreatitis 1980's  . Paroxysmal atrial fibrillation (HCC)   . AICD (automatic cardioverter/defibrillator) present     [employer]  . Presence of permanent cardiac pacemaker   . COPD (chronic obstructive pulmonary disease) ([place])   . SOB (shortness of breath)     not recently  . Arthritis   . Lung mass   . Radiation 2/2,2/4,2/8,2/10,05/24/14    Right upper lobe 60 Gy in 5 fractions    ROS:  All systems reviewed and negative except as noted in the HPI.   Past Surgical History  Procedure Laterality Date  . Icd  2001; 2007; 04/28/11    generator change; complete replacement; lead replacement  . Laparotomy  11/2005    for small bowel obstruction,  . Cardiac catheterization  11/23/2005  . Cardiac defibrillator placement  1996  . Cholecystectomy  1980's  . Cataract extraction  2012    left eye  . Video bronchoscopy with endobronchial navigation Right 02/27/2014    Procedure: VIDEO BRONCHOSCOPY WITH ENDOBRONCHIAL NAVIGATION,insertion Feducial marker right upper lobe lung;  Surgeon: Collene Gobble, MD;   Location: [facility];  Service: Thoracic;  Laterality: Right;  . Implantable cardioverter defibrillator (icd) generator change N/A 04/28/2011    Procedure: ICD GENERATOR CHANGE;  Surgeon: Evans Lance, MD;  Location: [facility] C[facility] LAB;  Service: Cardiovascular;  Laterality: N/A;  . Lead revision N/A 04/28/2011    Procedure: LEAD REVISION;  Surgeon: Evans Lance, MD;  Location: [facility] CATH LAB;  Service: Cardiovascular;  Laterality: N/A;     Family History  Problem Relation Age of Onset  . Heart disease Mother   . Heart disease Daughter   . Cancer Daughter      Social History   Social History  . Marital Status: Widowed    Spouse Name: N/A  . Number of Children: N/A  . Years of Education: N/A   Occupational History  . retired Administrator    Social History Main Topics  . Smoking status: Former Smoker -- 2.00 packs/day for 40 years    Types: Cigarettes    Quit date: 04/18/1983  . Smokeless tobacco: Former Systems developer    Types: Snuff, Chew     Comment: quit smoking cigarettes 1985; quit smokeless tobacco ~ 2011  . Alcohol Use: No  . Drug Use: No  . Sexual Activity: No   Other Topics Concern  . Not on file   Social History Narrative   The patient lives in [place]. He is widowed. 2 boys 2girls   He is a  retired Administrator   Patient is former smoker no tobacco use   Caffeine use - 3 cups     BP 130/62 mmHg  Pulse 62  Ht '5\' 9"'$  (1.753 m)  Wt 135 lb 9.6 oz (61.508 kg)  BMI 20.02 kg/m2  Physical Exam:  Stable but frail appearing elderly man, NAD HEENT: Unremarkable Neck:  7 cm JVD, no thyromegally Lungs:  Clear with no wheezes, rales, or rhonchi. HEART:  Regular rate rhythm, no murmurs, no rubs, no clicks Abd:  soft, positive bowel sounds, no organomegally, no rebound, no guarding Ext:  2 plus pulses, no edema, no cyanosis, no clubbing Skin:  No rashes no nodules Neuro:  CN II through XII intact, motor grossly intact  DEVICE  Normal device function.  See PaceArt for details.    Assess/Plan:

## 2015-02-06 NOTE — Assessment & Plan Note (Signed)
He denies anginal symptoms. Will follow. 

## 2015-02-20 NOTE — Telephone Encounter (Signed)
error 

## 2015-03-13 DEATH — deceased

## 2015-05-13 ENCOUNTER — Encounter: Payer: Self-pay | Admitting: Internal Medicine

## 2015-06-16 IMAGING — CT CT CHEST SUPER D W/O CM
1 of 3 series · 4 of 36 positions shown, 5 images · non-contrast
Comparison: PET-CT 02/12/2014 and chest CT 12/04/2013

CLINICAL DATA: Super D chest. Preop for lung biopsy. Right upper
lobe pulmonary lesion.

EXAM:
CT CHEST WITHOUT CONTRAST
TECHNIQUE: Multidetector CT imaging of the chest was performed using thin slice
collimation for electromagnetic bronchoscopy planning purposes,
without intravenous contrast.

[Series 602: <mpr thick range> · coronal · 0.66mm/px · 4 of 128 slices shown, 5 images]
[im 26/128  mediastinal]
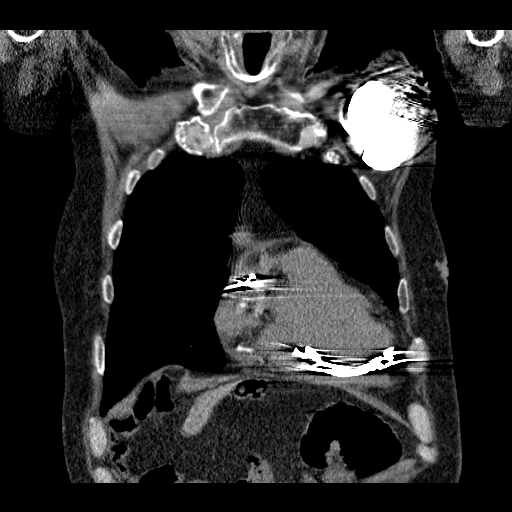
[im 26/128  lung]
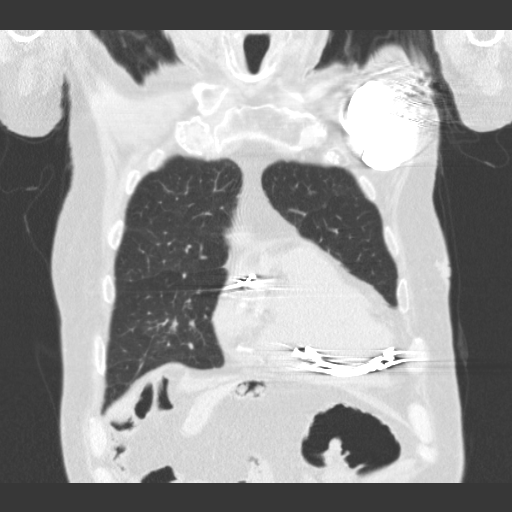
[im 51/128  lung]
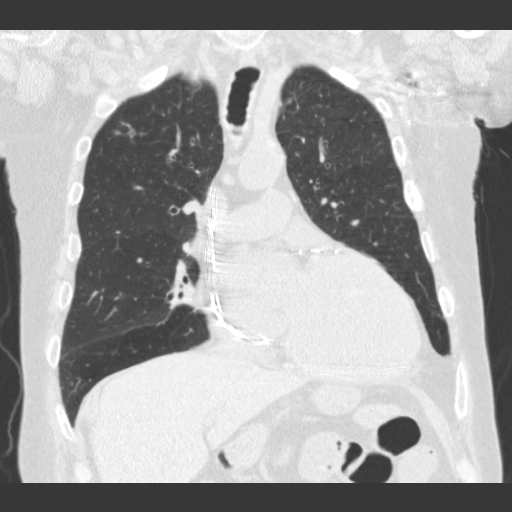
[im 77/128  lung]
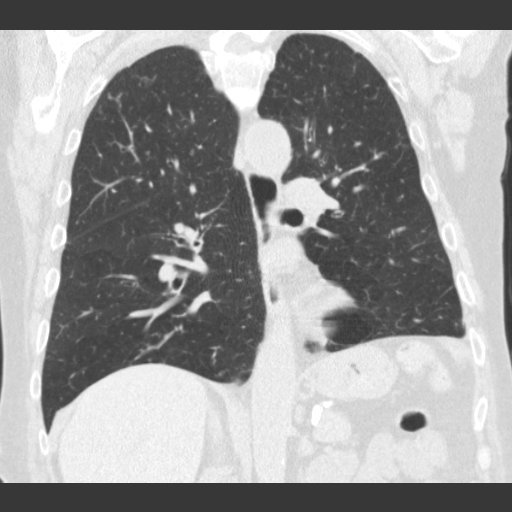
[im 102/128  lung]
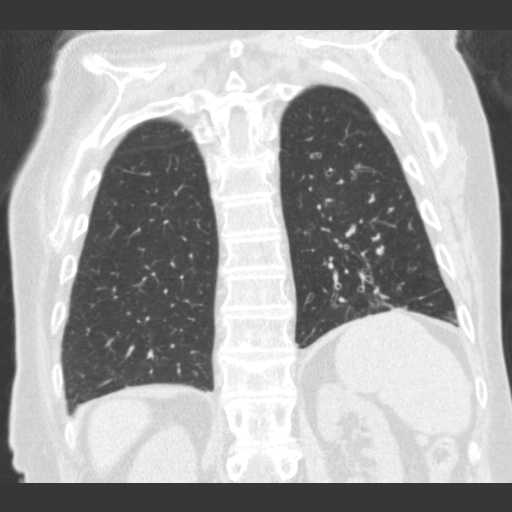

[4 of 36 positions shown; findings below may reference images not displayed]

FINDINGS: Chest wall: Permanent left-sided pacemaker is noted with wires in
good position and no complicating features. No chest wall mass,
supraclavicular or axillary lymphadenopathy. The bony thorax is
intact. No destructive bone lesions. Mild stable compression
deformity in the lower thoracic spine.

Mediastinum: The heart is within normal limits in size. No
pericardial effusion. No mediastinal or hilar mass or
lymphadenopathy. Stable atherosclerotic calcifications involving the
aorta and coronary arteries. The esophagus is grossly normal.

Lungs: Persistent spiculated right apical lung mass measuring a
maximum of 14 x 12 mm. This was metabolically active on the PET
scan. Left basilar scarring changes are noted along with mild
peribronchial thickening and patchy atelectasis in the right middle
lobe and both lower lobes. No infiltrates or effusions. Patchy areas
of tree-in-bud appearance likely due to chronic inflammation or
atypical infection such as DORU.

Upper abdomen:  No significant findings.
IMPRESSION: Stable spiculated right upper lobe pulmonary lesion.

No mediastinal or hilar mass or adenopathy.

Stable emphysematous changes, pulmonary scarring, atelectasis and
patchy tree-in-bud appearance.

## 2015-06-23 IMAGING — CR DG CHEST 1V PORT
1 series · 1 of 1 positions shown · non-contrast
Comparison: 02/22/2014 and earlier.

CLINICAL DATA: 85-year-old male status post bronchoscopy and
biopsy. Initial encounter.

EXAM:
PORTABLE CHEST - 1 VIEW

[AP]
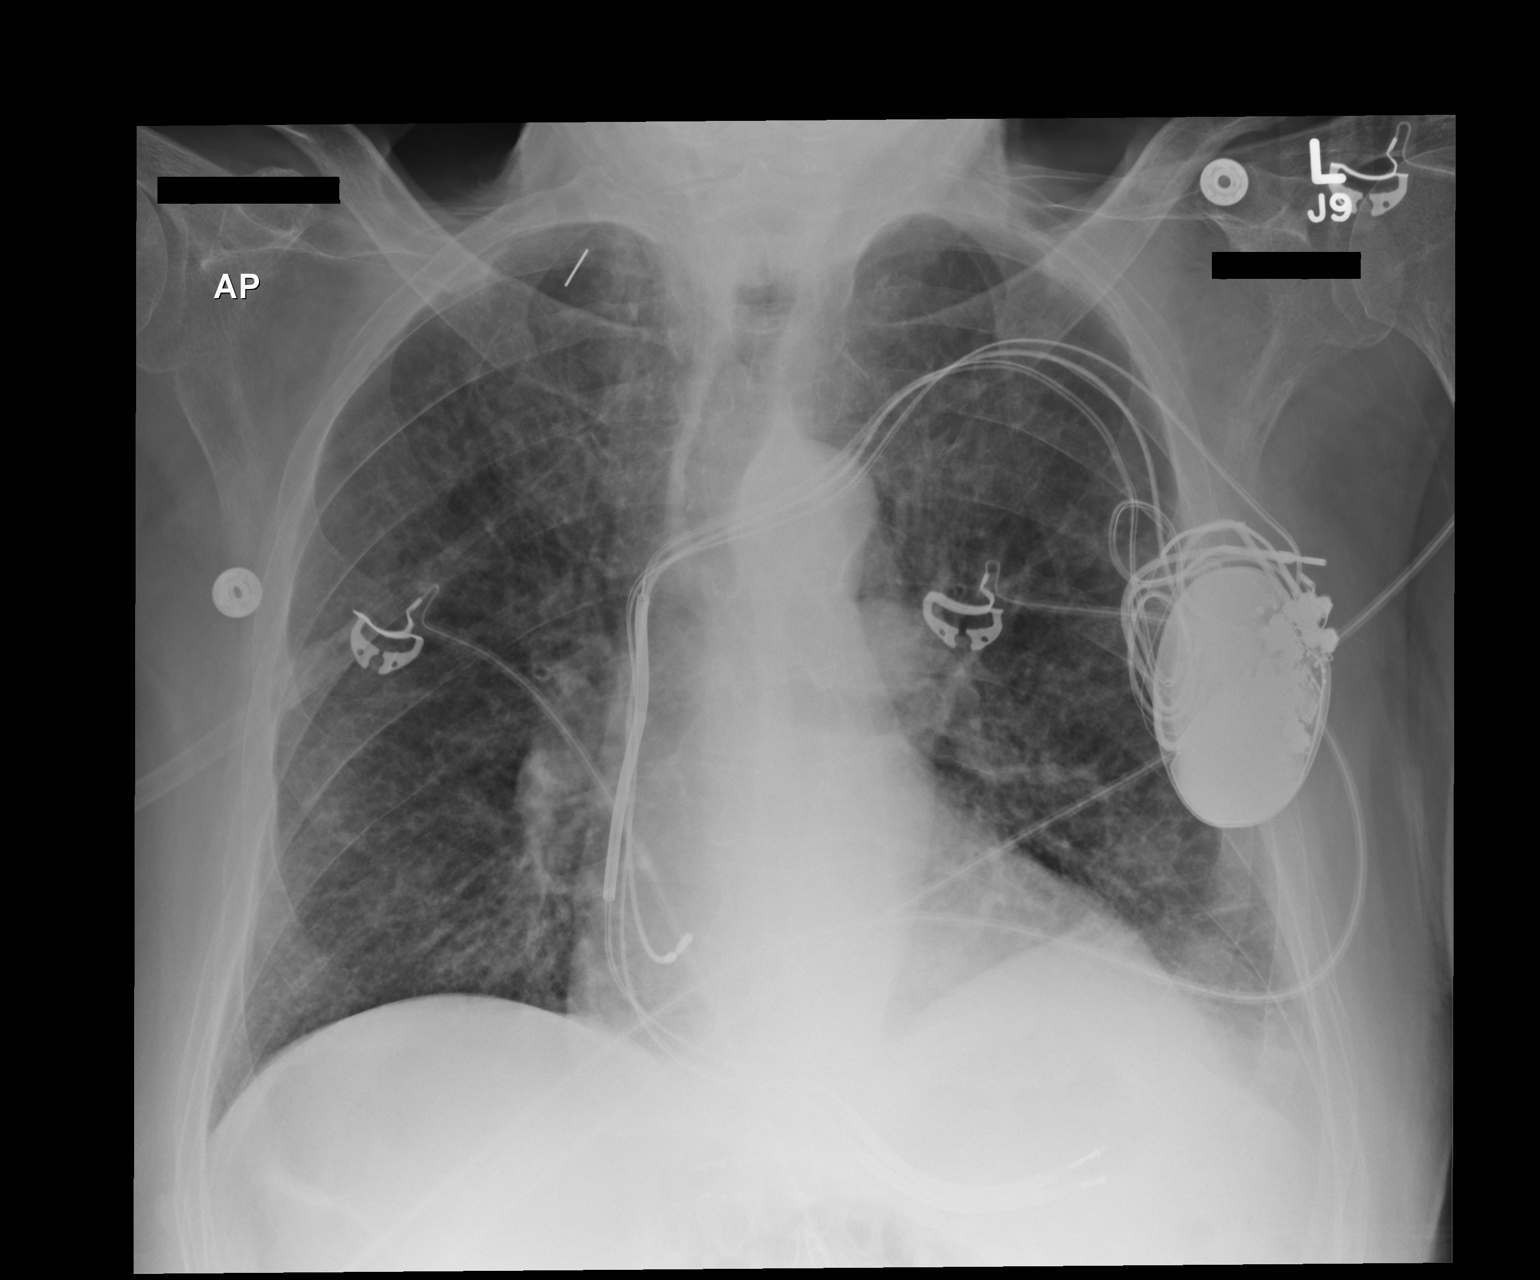

[1 of 1 positions shown; findings below may reference images not displayed]

FINDINGS: Portable AP upright view at 1922 hrs. There is a new linear metal
focus projecting over the right lung apex, perhaps related to
biopsy. Lower lung volumes. No pneumothorax or pleural effusion.
Stable elevation of the left hemidiaphragm. Stable cardiac size and
mediastinal contours. Stable left chest cardiac AICD. No acute
pulmonary opacity.
IMPRESSION: No pneumothorax or acute findings identified status post
bronchoscopy with biopsy.

## 2016-05-08 IMAGING — DX DG CHEST 2V
2 series · 2 of 2 positions shown · non-contrast
Comparison: Chest radiograph January 09, 2015 and CT chest
December 12, 2014.

CLINICAL DATA: Defibrillator activation while lying in bed. History
of CHF and myocardial infarction.

EXAM:
CHEST  2 VIEW

[chest pa]
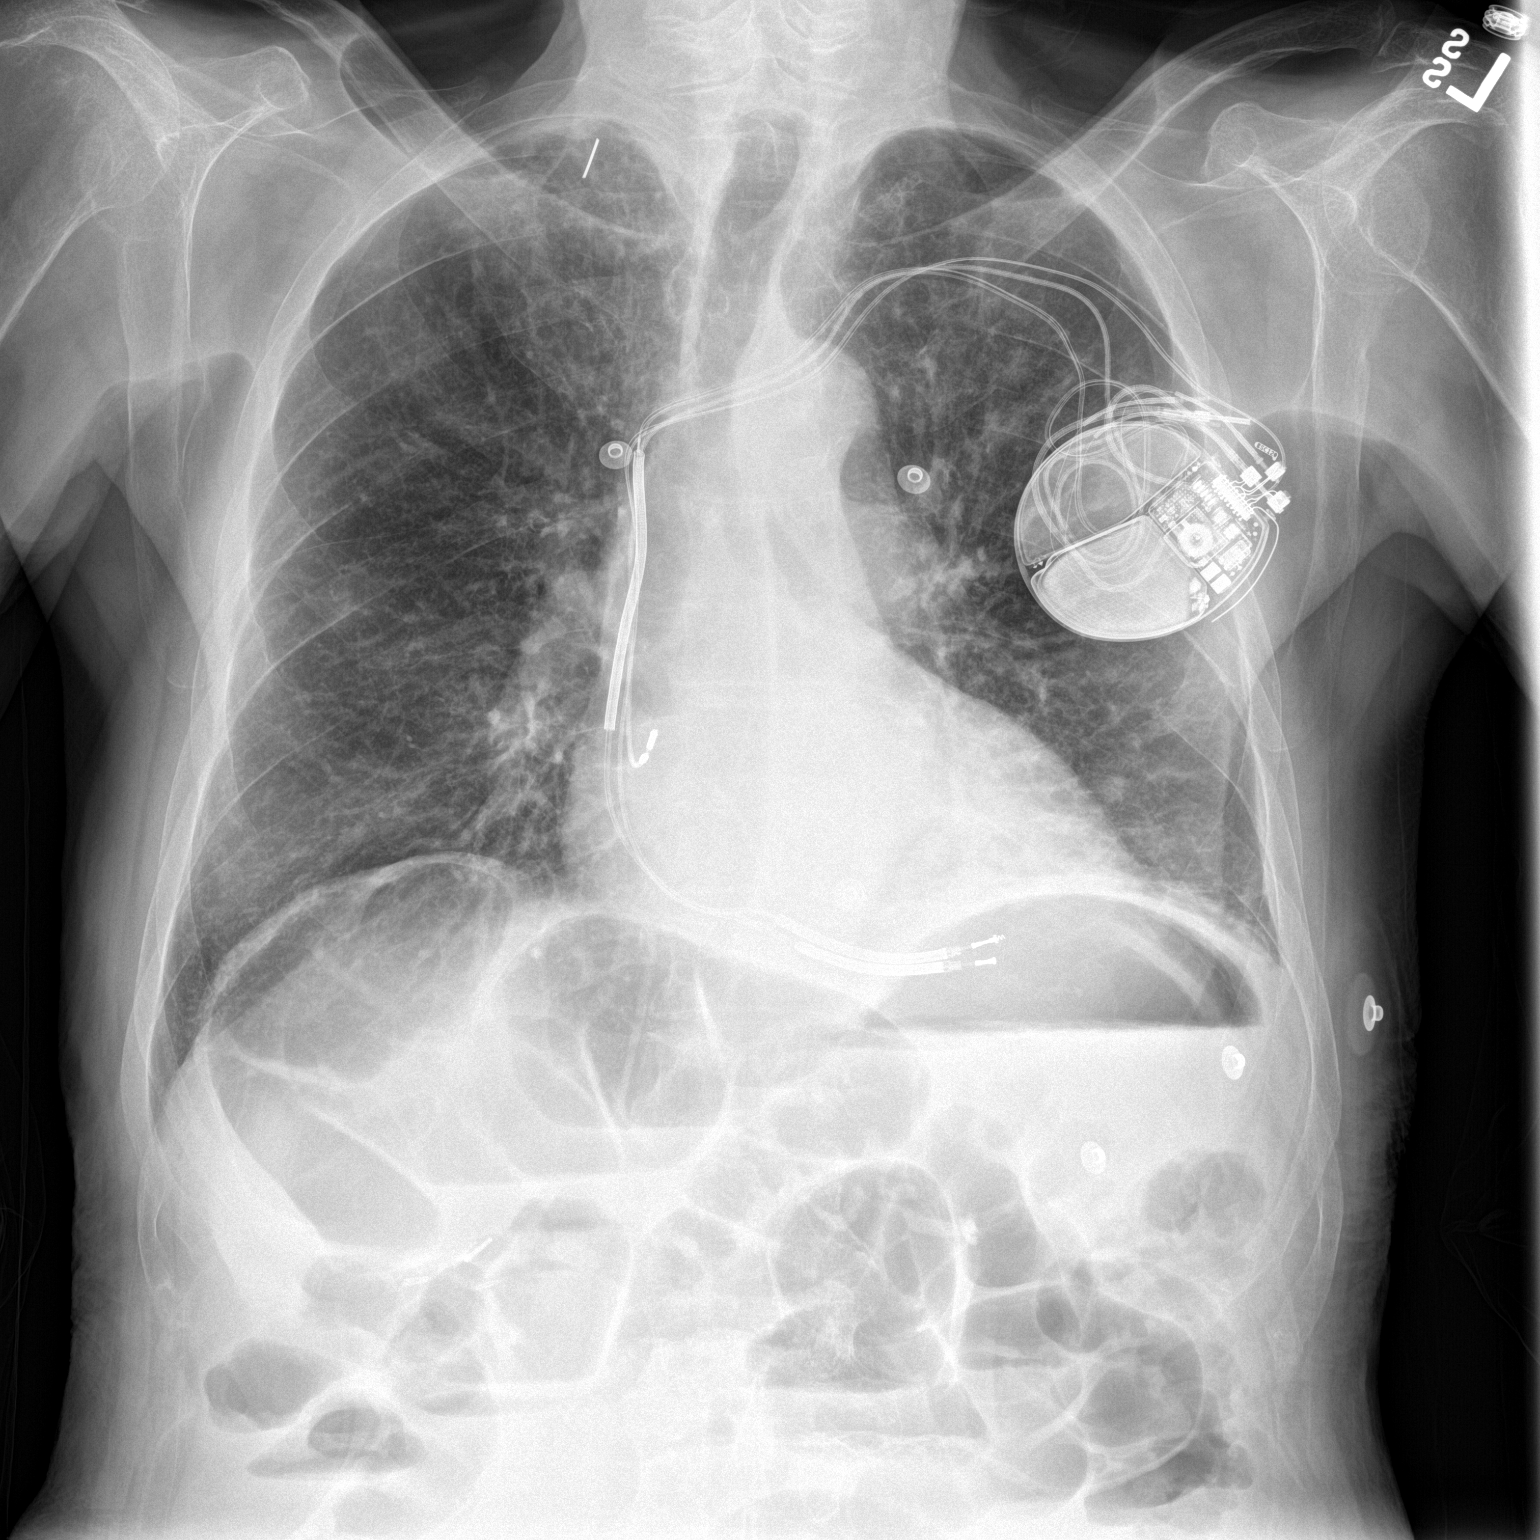

[chest lat]
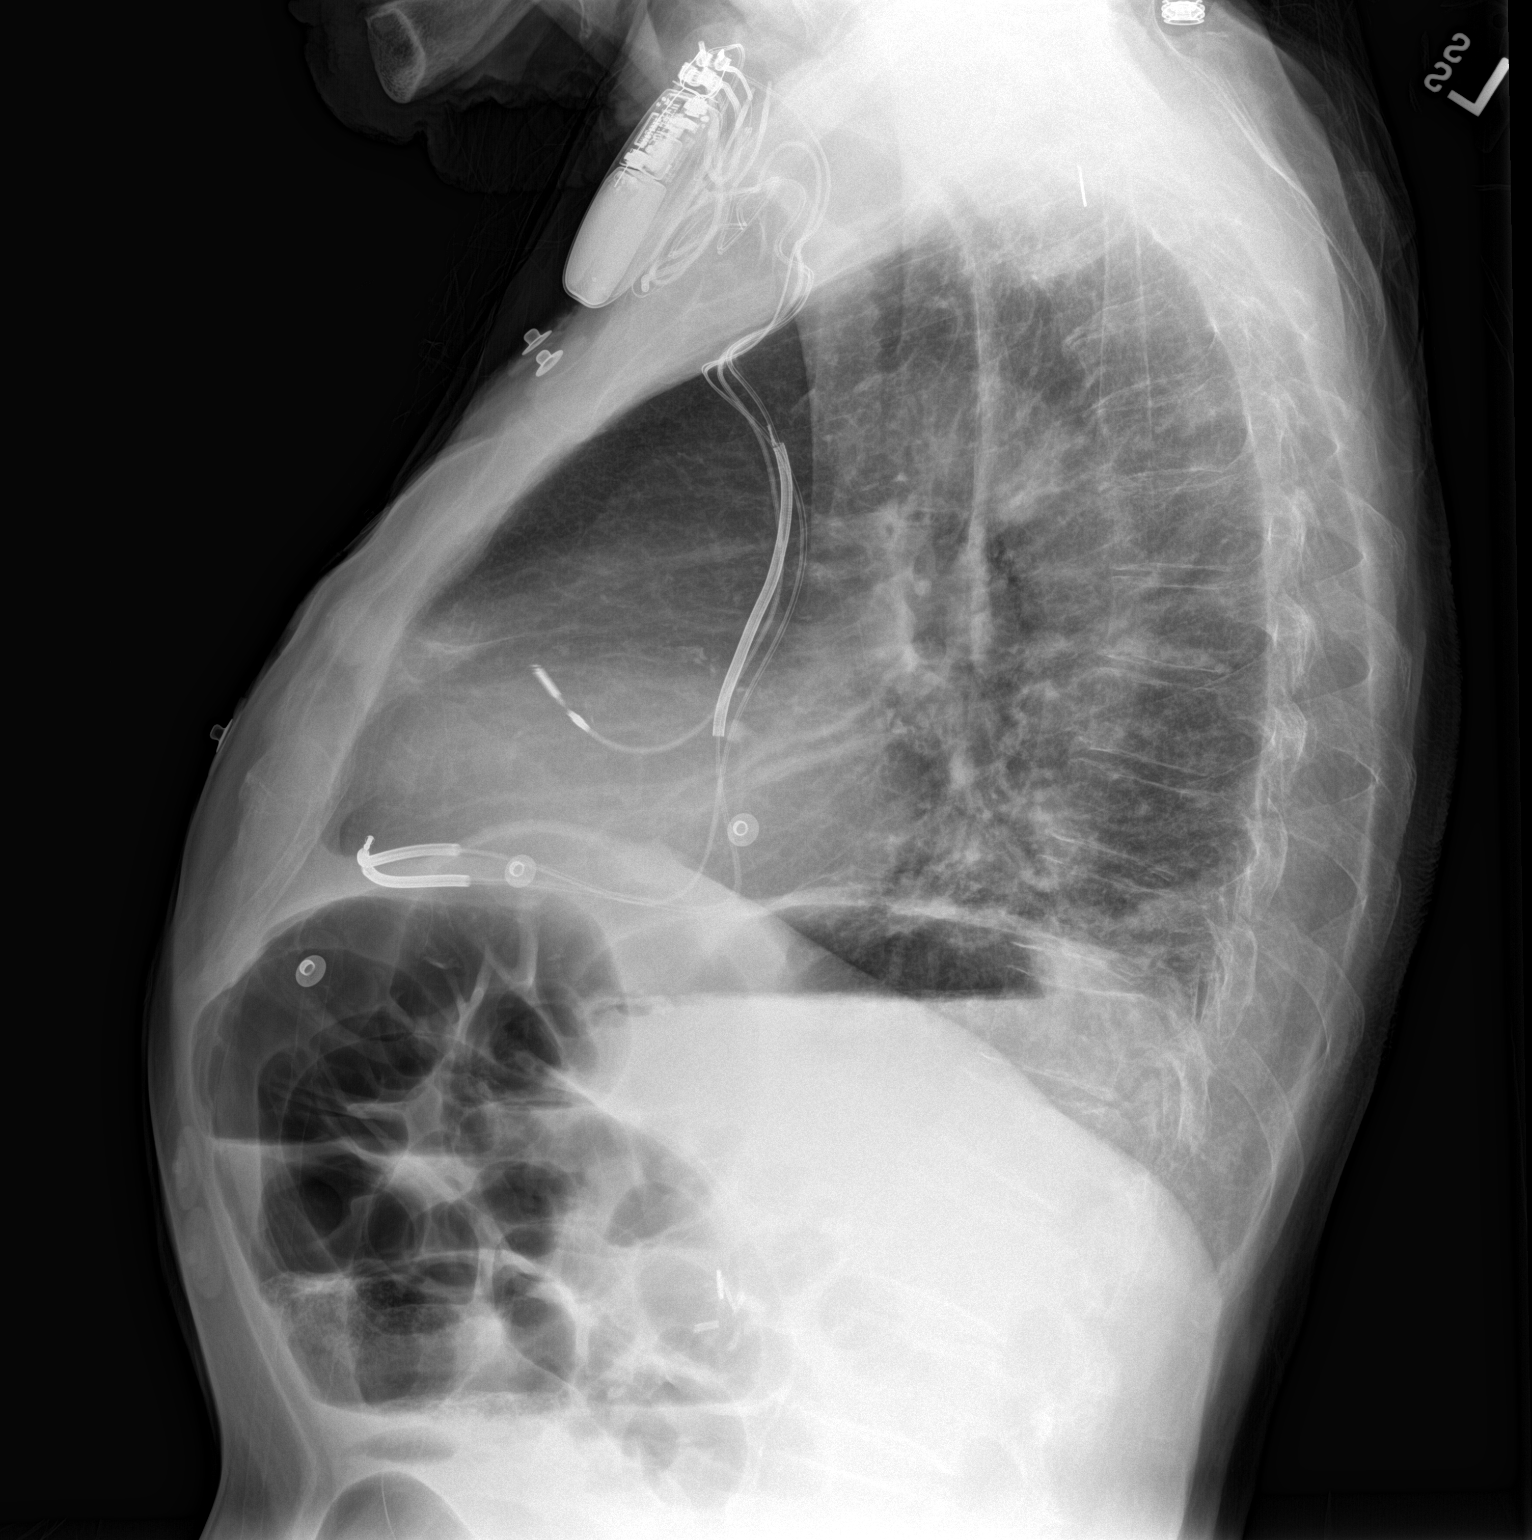

[2 of 2 positions shown; findings below may reference images not displayed]

FINDINGS: Cardiomediastinal silhouette is unchanged. Mild bronchitic changes,
improved aeration LEFT lung base with strandy densities. No pleural
effusion or focal consolidative the patient. Small nodule projects
in the LEFT lung base, more conspicuous than prior CT. Fibronodular
scarring RIGHT upper lobe. LEFT cardiac defibrillator in situ. No
pneumothorax. Surgical clip projects in RIGHT lung apex. Patient is
osteopenic. Surgical clips in the included right abdomen compatible
with cholecystectomy.
IMPRESSION: No active cardiopulmonary disease.

LEFT lung base atelectasis/scarring in a background of bronchitic
changes/COPD.

Increasing conspicuity LEFT lower lobe nodule. Please see CT chest
December 12, 2014.
